# Patient Record
Sex: Female | Born: 1970
Health system: Southern US, Community
[De-identification: ages and names within clinical notes are randomized; demographics above are authoritative.]

## PROBLEM LIST (undated history)

## (undated) DIAGNOSIS — R1310 Dysphagia, unspecified: Secondary | ICD-10-CM

## (undated) DIAGNOSIS — R0602 Shortness of breath: Secondary | ICD-10-CM

## (undated) DIAGNOSIS — E042 Nontoxic multinodular goiter: Secondary | ICD-10-CM

## (undated) DIAGNOSIS — R7303 Prediabetes: Secondary | ICD-10-CM

## (undated) DIAGNOSIS — K5900 Constipation, unspecified: Secondary | ICD-10-CM

## (undated) DIAGNOSIS — E785 Hyperlipidemia, unspecified: Secondary | ICD-10-CM

## (undated) DIAGNOSIS — M7989 Other specified soft tissue disorders: Secondary | ICD-10-CM

## (undated) DIAGNOSIS — E669 Obesity, unspecified: Secondary | ICD-10-CM

## (undated) DIAGNOSIS — D509 Iron deficiency anemia, unspecified: Secondary | ICD-10-CM

## (undated) DIAGNOSIS — I10 Essential (primary) hypertension: Secondary | ICD-10-CM

## (undated) HISTORY — DX: Essential (primary) hypertension: I10

## (undated) HISTORY — DX: Other specified soft tissue disorders: M79.89

## (undated) HISTORY — PX: TUBAL LIGATION: SHX77

## (undated) HISTORY — DX: Constipation, unspecified: K59.00

## (undated) HISTORY — DX: Shortness of breath: R06.02

## (undated) HISTORY — DX: Hyperlipidemia, unspecified: E78.5

## (undated) HISTORY — DX: Dysphagia, unspecified: R13.10

## (undated) HISTORY — DX: Iron deficiency anemia, unspecified: D50.9

## (undated) HISTORY — DX: Nontoxic multinodular goiter: E04.2

## (undated) HISTORY — DX: Prediabetes: R73.03

## (undated) HISTORY — DX: Obesity, unspecified: E66.9

---
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
## 1998-06-28 ENCOUNTER — Other Ambulatory Visit: Admission: RE | Admit: 1998-06-28 | Discharge: 1998-06-28 | Payer: Self-pay | Admitting: Obstetrics and Gynecology

## 1998-07-30 ENCOUNTER — Ambulatory Visit (HOSPITAL_COMMUNITY): Admission: RE | Admit: 1998-07-30 | Discharge: 1998-07-30 | Payer: Self-pay | Admitting: Obstetrics and Gynecology

## 1999-07-17 ENCOUNTER — Emergency Department (HOSPITAL_COMMUNITY): Admission: EM | Admit: 1999-07-17 | Discharge: 1999-07-17 | Payer: Self-pay | Admitting: Emergency Medicine

## 1999-07-18 ENCOUNTER — Other Ambulatory Visit: Admission: RE | Admit: 1999-07-18 | Discharge: 1999-07-18 | Payer: Self-pay | Admitting: Obstetrics and Gynecology

## 2000-07-13 ENCOUNTER — Emergency Department (HOSPITAL_COMMUNITY): Admission: EM | Admit: 2000-07-13 | Discharge: 2000-07-13 | Payer: Self-pay | Admitting: Emergency Medicine

## 2000-09-11 ENCOUNTER — Other Ambulatory Visit: Admission: RE | Admit: 2000-09-11 | Discharge: 2000-09-11 | Payer: Self-pay | Admitting: Obstetrics and Gynecology

## 2001-05-04 ENCOUNTER — Inpatient Hospital Stay (HOSPITAL_COMMUNITY): Admission: AD | Admit: 2001-05-04 | Discharge: 2001-05-04 | Payer: Self-pay | Admitting: Obstetrics & Gynecology

## 2001-06-21 ENCOUNTER — Inpatient Hospital Stay (HOSPITAL_COMMUNITY): Admission: AD | Admit: 2001-06-21 | Discharge: 2001-06-21 | Payer: Self-pay | Admitting: Obstetrics and Gynecology

## 2001-06-26 ENCOUNTER — Inpatient Hospital Stay (HOSPITAL_COMMUNITY): Admission: AD | Admit: 2001-06-26 | Discharge: 2001-06-29 | Payer: Self-pay | Admitting: Obstetrics and Gynecology

## 2001-06-26 ENCOUNTER — Encounter: Payer: Self-pay | Admitting: Obstetrics and Gynecology

## 2001-09-16 ENCOUNTER — Other Ambulatory Visit: Admission: RE | Admit: 2001-09-16 | Discharge: 2001-09-16 | Payer: Self-pay | Admitting: Obstetrics and Gynecology

## 2002-12-31 ENCOUNTER — Inpatient Hospital Stay (HOSPITAL_COMMUNITY): Admission: AD | Admit: 2002-12-31 | Discharge: 2003-01-03 | Payer: Self-pay | Admitting: Obstetrics and Gynecology

## 2003-01-04 ENCOUNTER — Encounter: Admission: RE | Admit: 2003-01-04 | Discharge: 2003-02-03 | Payer: Self-pay | Admitting: Obstetrics and Gynecology

## 2003-01-27 ENCOUNTER — Other Ambulatory Visit: Admission: RE | Admit: 2003-01-27 | Discharge: 2003-01-27 | Payer: Self-pay | Admitting: Obstetrics & Gynecology

## 2003-02-04 ENCOUNTER — Encounter: Admission: RE | Admit: 2003-02-04 | Discharge: 2003-03-06 | Payer: Self-pay | Admitting: Obstetrics and Gynecology

## 2003-02-17 ENCOUNTER — Ambulatory Visit (HOSPITAL_COMMUNITY): Admission: RE | Admit: 2003-02-17 | Discharge: 2003-02-17 | Payer: Self-pay | Admitting: Obstetrics and Gynecology

## 2003-04-06 ENCOUNTER — Encounter: Admission: RE | Admit: 2003-04-06 | Discharge: 2003-05-06 | Payer: Self-pay | Admitting: Obstetrics and Gynecology

## 2003-06-06 ENCOUNTER — Encounter: Admission: RE | Admit: 2003-06-06 | Discharge: 2003-07-06 | Payer: Self-pay | Admitting: Obstetrics and Gynecology

## 2004-01-02 ENCOUNTER — Emergency Department (HOSPITAL_COMMUNITY): Admission: AD | Admit: 2004-01-02 | Discharge: 2004-01-02 | Payer: Self-pay | Admitting: Family Medicine

## 2004-01-20 ENCOUNTER — Encounter: Admission: RE | Admit: 2004-01-20 | Discharge: 2004-01-20 | Payer: Self-pay | Admitting: Internal Medicine

## 2004-07-29 ENCOUNTER — Emergency Department (HOSPITAL_COMMUNITY): Admission: EM | Admit: 2004-07-29 | Discharge: 2004-07-29 | Payer: Self-pay | Admitting: Emergency Medicine

## 2004-11-18 ENCOUNTER — Other Ambulatory Visit: Admission: RE | Admit: 2004-11-18 | Discharge: 2004-11-18 | Payer: Self-pay | Admitting: Obstetrics and Gynecology

## 2004-11-21 ENCOUNTER — Other Ambulatory Visit: Admission: RE | Admit: 2004-11-21 | Discharge: 2004-11-21 | Payer: Self-pay | Admitting: Obstetrics and Gynecology

## 2005-07-10 ENCOUNTER — Other Ambulatory Visit: Admission: RE | Admit: 2005-07-10 | Discharge: 2005-07-10 | Payer: Self-pay | Admitting: Obstetrics and Gynecology

## 2005-07-11 ENCOUNTER — Other Ambulatory Visit: Admission: RE | Admit: 2005-07-11 | Discharge: 2005-07-11 | Payer: Self-pay | Admitting: Obstetrics and Gynecology

## 2005-10-30 ENCOUNTER — Emergency Department (HOSPITAL_COMMUNITY): Admission: EM | Admit: 2005-10-30 | Discharge: 2005-10-30 | Payer: Self-pay | Admitting: Family Medicine

## 2007-09-09 LAB — CONVERTED CEMR LAB
Cholesterol: 291 mg/dL
HDL: 43 mg/dL
LDL Cholesterol: 232 mg/dL

## 2008-03-25 ENCOUNTER — Ambulatory Visit: Payer: Self-pay | Admitting: Internal Medicine

## 2008-03-25 DIAGNOSIS — E785 Hyperlipidemia, unspecified: Secondary | ICD-10-CM

## 2008-03-25 DIAGNOSIS — E669 Obesity, unspecified: Secondary | ICD-10-CM

## 2008-03-25 DIAGNOSIS — I10 Essential (primary) hypertension: Secondary | ICD-10-CM | POA: Insufficient documentation

## 2008-03-25 HISTORY — DX: Essential (primary) hypertension: I10

## 2008-03-25 HISTORY — DX: Obesity, unspecified: E66.9

## 2008-03-25 HISTORY — DX: Hyperlipidemia, unspecified: E78.5

## 2008-03-27 LAB — CONVERTED CEMR LAB
ALT: 14 units/L (ref 0–35)
BUN: 14 mg/dL (ref 6–23)
Creatinine, Ser: 0.6 mg/dL (ref 0.4–1.2)
GFR calc Af Amer: 146 mL/min
GFR calc non Af Amer: 121 mL/min
Glucose, Bld: 78 mg/dL (ref 70–99)
Potassium: 3.7 meq/L (ref 3.5–5.1)
Total Bilirubin: 0.3 mg/dL (ref 0.3–1.2)
Total Protein: 7.5 g/dL (ref 6.0–8.3)

## 2008-05-08 ENCOUNTER — Emergency Department (HOSPITAL_COMMUNITY): Admission: EM | Admit: 2008-05-08 | Discharge: 2008-05-08 | Payer: Self-pay | Admitting: Family Medicine

## 2008-05-18 ENCOUNTER — Ambulatory Visit: Payer: Self-pay | Admitting: Internal Medicine

## 2008-05-18 LAB — CONVERTED CEMR LAB
Alkaline Phosphatase: 64 units/L (ref 39–117)
Cholesterol: 165 mg/dL (ref 0–200)
Total Protein: 7.5 g/dL (ref 6.0–8.3)
Triglycerides: 90 mg/dL (ref 0–149)

## 2008-05-26 ENCOUNTER — Ambulatory Visit: Payer: Self-pay | Admitting: Internal Medicine

## 2008-11-13 ENCOUNTER — Ambulatory Visit: Payer: Self-pay | Admitting: Internal Medicine

## 2008-11-13 LAB — CONVERTED CEMR LAB
ALT: 10 units/L (ref 0–35)
AST: 17 units/L (ref 0–37)
Alkaline Phosphatase: 62 units/L (ref 39–117)
BUN: 12 mg/dL (ref 6–23)
CO2: 25 meq/L (ref 19–32)
Creatinine, Ser: 0.4 mg/dL (ref 0.4–1.2)
GFR calc Af Amer: 232 mL/min
GFR calc non Af Amer: 192 mL/min
HDL: 39.1 mg/dL (ref >39.0)
LDL Cholesterol: 97 mg/dL (ref 0–99)
Total Protein: 7.7 g/dL (ref 6.0–8.3)
VLDL: 13 mg/dL (ref 0–40)

## 2008-11-20 ENCOUNTER — Ambulatory Visit: Payer: Self-pay | Admitting: Internal Medicine

## 2008-12-02 ENCOUNTER — Encounter (INDEPENDENT_AMBULATORY_CARE_PROVIDER_SITE_OTHER): Payer: Self-pay | Admitting: Obstetrics and Gynecology

## 2008-12-02 ENCOUNTER — Ambulatory Visit (HOSPITAL_COMMUNITY): Admission: RE | Admit: 2008-12-02 | Discharge: 2008-12-02 | Payer: Self-pay | Admitting: Obstetrics and Gynecology

## 2009-05-14 ENCOUNTER — Ambulatory Visit: Payer: Self-pay | Admitting: Internal Medicine

## 2009-05-14 LAB — CONVERTED CEMR LAB
Albumin: 3.3 g/dL — ABNORMAL LOW (ref 3.5–5.2)
Alkaline Phosphatase: 83 units/L (ref 39–117)
Basophils Absolute: 0.1 10*3/uL (ref 0.0–0.1)
Bilirubin, Direct: 0 mg/dL (ref 0.0–0.3)
Calcium: 8.9 mg/dL (ref 8.4–10.5)
Direct LDL: 156.1 mg/dL
Eosinophils Absolute: 0.1 10*3/uL (ref 0.0–0.7)
HCT: 28.7 % — ABNORMAL LOW (ref 36.0–46.0)
HDL: 44.8 mg/dL (ref >39.00)
MCV: 61.5 fL — ABNORMAL LOW (ref 78.0–100.0)
Nitrite: NEGATIVE
Specific Gravity, Urine: >=1.03
TSH: 0.67 microintl units/mL (ref 0.35–5.50)
Total CHOL/HDL Ratio: 5
Urobilinogen, UA: 1
WBC: 8.5 10*3/uL (ref 4.5–10.5)
pH: 5.5

## 2009-05-29 ENCOUNTER — Emergency Department (HOSPITAL_COMMUNITY): Admission: EM | Admit: 2009-05-29 | Discharge: 2009-05-29 | Payer: Self-pay | Admitting: Emergency Medicine

## 2009-06-11 ENCOUNTER — Ambulatory Visit: Payer: Self-pay | Admitting: Internal Medicine

## 2009-06-11 DIAGNOSIS — D509 Iron deficiency anemia, unspecified: Secondary | ICD-10-CM

## 2009-06-11 HISTORY — DX: Iron deficiency anemia, unspecified: D50.9

## 2009-06-15 ENCOUNTER — Encounter: Admission: RE | Admit: 2009-06-15 | Discharge: 2009-06-15 | Payer: Self-pay | Admitting: Internal Medicine

## 2009-07-15 ENCOUNTER — Ambulatory Visit: Payer: Self-pay | Admitting: Endocrinology

## 2009-07-15 DIAGNOSIS — E042 Nontoxic multinodular goiter: Secondary | ICD-10-CM | POA: Insufficient documentation

## 2009-07-15 HISTORY — DX: Nontoxic multinodular goiter: E04.2

## 2009-10-04 ENCOUNTER — Ambulatory Visit: Payer: Self-pay | Admitting: Internal Medicine

## 2009-10-04 LAB — CONVERTED CEMR LAB
Alkaline Phosphatase: 82 units/L (ref 39–117)
Bilirubin, Direct: 0.1 mg/dL (ref 0.0–0.3)
Cholesterol: 224 mg/dL — ABNORMAL HIGH (ref 0–200)
Total Bilirubin: 0.5 mg/dL (ref 0.3–1.2)
Total CHOL/HDL Ratio: 5
VLDL: 11.8 mg/dL (ref 0.0–40.0)

## 2009-10-08 ENCOUNTER — Ambulatory Visit: Payer: Self-pay | Admitting: Internal Medicine

## 2009-10-30 HISTORY — PX: LAPAROSCOPIC GASTRIC BANDING: SHX1100

## 2009-11-30 HISTORY — PX: LEEP: SHX91

## 2009-12-25 ENCOUNTER — Ambulatory Visit: Payer: Self-pay | Admitting: Family Medicine

## 2009-12-30 ENCOUNTER — Ambulatory Visit: Payer: Self-pay | Admitting: Internal Medicine

## 2009-12-30 ENCOUNTER — Telehealth: Payer: Self-pay | Admitting: Internal Medicine

## 2009-12-30 LAB — CONVERTED CEMR LAB
ALT: 15 units/L (ref 0–35)
Alkaline Phosphatase: 95 units/L (ref 39–117)
Basophils Absolute: 0 10*3/uL (ref 0.0–0.1)
CO2: 31 meq/L (ref 19–32)
Calcium: 9.3 mg/dL (ref 8.4–10.5)
Eosinophils Relative: 0.6 % (ref 0.0–5.0)
GFR calc non Af Amer: 144.49 mL/min (ref >60)
Glucose, Bld: 84 mg/dL (ref 70–99)
HDL: 45.5 mg/dL (ref >39.00)
Hemoglobin: 13.3 g/dL (ref 12.0–15.0)
MCV: 81.2 fL (ref 78.0–100.0)
Monocytes Absolute: 0.7 10*3/uL (ref 0.1–1.0)
Potassium: 2.8 meq/L — CL (ref 3.5–5.1)
Total CHOL/HDL Ratio: 4

## 2010-01-03 ENCOUNTER — Ambulatory Visit: Payer: Self-pay | Admitting: Internal Medicine

## 2010-01-04 LAB — CONVERTED CEMR LAB
BUN: 10 mg/dL (ref 6–23)
Calcium: 9.1 mg/dL (ref 8.4–10.5)
Chloride: 100 meq/L (ref 96–112)
GFR calc non Af Amer: 120.94 mL/min (ref >60)
Glucose, Bld: 96 mg/dL (ref 70–99)
Potassium: 3.1 meq/L — ABNORMAL LOW (ref 3.5–5.1)

## 2010-01-06 ENCOUNTER — Ambulatory Visit: Payer: Self-pay | Admitting: Internal Medicine

## 2010-05-27 ENCOUNTER — Ambulatory Visit: Payer: Self-pay | Admitting: Internal Medicine

## 2010-06-28 ENCOUNTER — Ambulatory Visit (HOSPITAL_COMMUNITY): Admission: RE | Admit: 2010-06-28 | Discharge: 2010-06-28 | Payer: Self-pay | Admitting: General Surgery

## 2010-07-18 ENCOUNTER — Encounter
Admission: RE | Admit: 2010-07-18 | Discharge: 2010-10-16 | Payer: Self-pay | Source: Home / Self Care | Attending: General Surgery | Admitting: General Surgery

## 2010-08-23 ENCOUNTER — Encounter: Payer: Self-pay | Admitting: Internal Medicine

## 2010-08-24 ENCOUNTER — Ambulatory Visit: Payer: Self-pay | Admitting: Internal Medicine

## 2010-10-04 ENCOUNTER — Ambulatory Visit (HOSPITAL_COMMUNITY)
Admission: RE | Admit: 2010-10-04 | Discharge: 2010-10-04 | Payer: Self-pay | Source: Home / Self Care | Admitting: General Surgery

## 2010-10-14 ENCOUNTER — Encounter: Payer: Self-pay | Admitting: Internal Medicine

## 2010-10-18 ENCOUNTER — Encounter
Admission: RE | Admit: 2010-10-18 | Discharge: 2010-11-29 | Payer: Self-pay | Source: Home / Self Care | Attending: General Surgery | Admitting: General Surgery

## 2010-11-25 ENCOUNTER — Other Ambulatory Visit: Payer: Self-pay | Admitting: Internal Medicine

## 2010-11-25 ENCOUNTER — Ambulatory Visit
Admission: RE | Admit: 2010-11-25 | Discharge: 2010-11-25 | Payer: Self-pay | Source: Home / Self Care | Attending: Internal Medicine | Admitting: Internal Medicine

## 2010-11-25 LAB — BASIC METABOLIC PANEL
CO2: 26 mEq/L (ref 19–32)
Creatinine, Ser: 0.6 mg/dL (ref 0.4–1.2)
GFR: 146.61 mL/min (ref >60.00)
Glucose, Bld: 77 mg/dL (ref 70–99)
Potassium: 4.4 mEq/L (ref 3.5–5.1)

## 2010-11-25 LAB — TSH: TSH: 0.61 u[IU]/mL (ref 0.35–5.50)

## 2010-11-25 LAB — CBC WITH DIFFERENTIAL/PLATELET
Eosinophils Relative: 1.2 % (ref 0.0–5.0)
Hemoglobin: 13.2 g/dL (ref 12.0–15.0)
Lymphs Abs: 2 10*3/uL (ref 0.7–4.0)
MCHC: 34 g/dL (ref 30.0–36.0)
MCV: 82.7 fl (ref 78.0–100.0)
Monocytes Absolute: 0.4 10*3/uL (ref 0.1–1.0)
Monocytes Relative: 5.3 % (ref 3.0–12.0)
Neutrophils Relative %: 67.6 % (ref 43.0–77.0)
Platelets: 320 10*3/uL (ref 150.0–400.0)
RDW: 15 % — ABNORMAL HIGH (ref 11.5–14.6)
WBC: 7.7 10*3/uL (ref 4.5–10.5)

## 2010-11-25 LAB — HEPATIC FUNCTION PANEL
ALT: 31 U/L (ref 0–35)
Alkaline Phosphatase: 87 U/L (ref 39–117)
Bilirubin, Direct: 0.1 mg/dL (ref 0.0–0.3)
Total Bilirubin: 0.6 mg/dL (ref 0.3–1.2)

## 2010-11-25 LAB — LIPID PANEL: Triglycerides: 61 mg/dL (ref 0.0–149.0)

## 2010-11-25 LAB — LDL CHOLESTEROL, DIRECT: Direct LDL: 168.6 mg/dL

## 2010-11-28 ENCOUNTER — Telehealth: Payer: Self-pay | Admitting: Internal Medicine

## 2010-11-29 NOTE — Assessment & Plan Note (Signed)
Summary: cough-greenish yellow x 2 dys, sorethroat, achy rm 3   Vital Signs:  Patient Profile:   40 Years Old Female CC:      Cold & URI symptoms Height:     63 inches Weight:      300 pounds O2 Sat:      100 % O2 treatment:    Room Air Temp:     98.4 degrees F oral Pulse rate:   80 / minute Pulse rhythm:   300regular Resp:     17 per minute (right arm) Cuff size:   regular  Vitals Entered By: Areta Haber CMA (December 25, 2009 9:29 AM)                  Current Allergies: No known allergies History of Present Illness History from: patient Chief Complaint: Cold & URI symptoms History of Present Illness: cough  and aching for 2 days, yellow green phlegm  Current Problems: UPPER RESPIRATORY INFECTION, ACUTE (ICD-465.9) ? of COUGH (ICD-786.2) GOITER, MULTINODULAR (ICD-241.1) PREVENTIVE HEALTH CARE (ICD-V70.0) ANEMIA, IRON DEFICIENCY UNSPEC (ICD-280.9) OBESITY (ICD-278.00) HYPERTENSION (ICD-401.9) HYPERLIPIDEMIA (ICD-272.4)   Current Meds SIMVASTATIN 40 MG TABS (SIMVASTATIN) Take one tablet at bedtime IBUPROFEN 800 MG  TABS (IBUPROFEN) as needed * FLEXERIL as needed * VICODIN as needed FERROUS SULFATE 325 (65 FE) MG TABS (FERROUS SULFATE) two times a day CHLORTHALIDONE 25 MG  TABS (CHLORTHALIDONE) Take 1 tab by mouth every day STOOL SOFTENER 100 MG CAPS (DOCUSATE SODIUM) 1 tab by mouth two times a day PHENTERMINE HCL 30 MG CAPS (PHENTERMINE HCL) 1 tab by mouth once daily * Z-PACK use as directed * CHERATUSSIN A/C  6OZ 1-2 tsp q6h as needed cough  REVIEW OF SYSTEMS Constitutional Symptoms       Complains of fever and fatigue.     Denies chills, night sweats, weight loss, and weight gain.      Comments: achy Eyes       Denies change in vision, eye pain, eye discharge, glasses, contact lenses, and eye surgery. Ear/Nose/Throat/Mouth       Complains of sore throat and hoarseness.      Denies hearing loss/aids, change in hearing, ear pain, ear discharge,  dizziness, frequent runny nose, frequent nose bleeds, sinus problems, and tooth pain or bleeding.      Comments: x 2 dys Respiratory       Complains of productive cough and shortness of breath.      Denies dry cough, wheezing, asthma, bronchitis, and emphysema/COPD.  Cardiovascular       Denies murmurs, chest pain, and tires easily with exhertion.    Gastrointestinal       Denies stomach pain, nausea/vomiting, diarrhea, constipation, blood in bowel movements, and indigestion. Genitourniary       Denies painful urination, kidney stones, and loss of urinary control. Neurological       Denies paralysis, seizures, and fainting/blackouts. Musculoskeletal       Denies muscle pain, joint pain, joint stiffness, decreased range of motion, redness, swelling, muscle weakness, and gout.  Skin       Denies bruising, unusual mles/lumps or sores, and hair/skin or nail changes.  Psych       Denies mood changes, temper/anger issues, anxiety/stress, speech problems, depression, and sleep problems.  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 Alcohol use-yes Regular exercise-no Single 5 kids---healthy Divorced  Risk Factors: Exercise: no (03/25/2008)  Risk Factors: Smoking Status: quit > 6 months (10/08/2009) Physical Exam General appearance: well developed, well nourished, no acute distress Head: normocephalic, atraumatic Eyes: conjunctivae and lids normal Pupils: equal, round, reactive to light Ears: normal, no lesions  or deformities Nasal: mucosa pink, nonedematous, no septal deviation, turbinates normal Oral/Pharynx: tongue normal, posterior pharynx without erythema or exudate Neck: neck supple,  trachea midline, no masses Thyroid: no nodules, masses, tenderness, or enlargement Chest/Lungs: scattered rhonchi Heart: regular rate and  rhythm, no murmur Assessment New Problems: UPPER RESPIRATORY INFECTION, ACUTE (ICD-465.9) ? of COUGH (ICD-786.2)   Patient Education: Patient and/or caregiver instructed in the following: rest fluids and Tylenol. take all of z-pack  Plan New Medications/Changes: CHERATUSSIN A/C  6OZ 1-2 tsp q6h as needed cough  #6oz x 1, 12/25/2009, Quita Skye Elvyn Krohn MD Z-PACK use as directed  #1 x 0, 12/25/2009, Linna Hoff MD  New Orders: T-Chest x-ray, 2 views [71020] Est. Patient Level III [[address]] Follow Up: Follow up with Primary Physician  The patient and/or caregiver has been counseled thoroughly with regard to medications prescribed including dosage, schedule, interactions, rationale for use, and possible side effects and they verbalize understanding.  Diagnoses and expected course of recovery discussed and will return if not improved as expected or if the condition worsens. Patient and/or caregiver verbalized understanding.  Prescriptions: CHERATUSSIN A/C  6OZ 1-2 tsp q6h as needed cough  #6oz x 1   Entered and Authorized by:   Linna Hoff MD   Signed by:   Linna Hoff MD on 12/25/2009   Method used:   Print then Give to Patient   RxID:   [id number] Z-PACK use as directed  #1 x 0   Entered and Authorized by:   Linna Hoff MD   Signed by:   Linna Hoff MD on 12/25/2009   Method used:   Print then Give to Patient   RxID:   [id number]   Patient Instructions: 1)  taker all of medicine as indicated 2)  Drink as much fluid as you can tolerate for the next few days. 3)  Please schedule a follow-up appointment as needed.

## 2010-11-29 NOTE — Letter (Signed)
Summary: Health Screening Report  Health Screening Report   Imported By: Maryln Gottron 08/29/2010 10:45:49  _____________________________________________________________________  External Attachment:    Type:   Image     Comment:   External Document

## 2010-11-29 NOTE — Assessment & Plan Note (Signed)
Summary: 3 week fup//ccm   Vital Signs:  Patient profile:   40 year old female Weight:      291 pounds Temp:     98.1 degrees F oral Pulse rate:   96 / minute BP sitting:   124 / 90  (left arm) Cuff size:   large  Vitals Entered By: Alfred Levins, CMA (August 24, 2010 8:37 AM) CC: f/u on bp and cholesterol, not fasting   Primary Care Provider:  Dr Birdie Sons  CC:  f/u on bp and cholesterol and not fasting.  History of Present Illness:  Follow-Up Visit      This is a 40 year old woman who presents for Follow-up visit.  The patient denies chest pain and palpitations.  Since the last visit the patient notes no new problems or concerns.  The patient reports taking meds as prescribed, dietary noncompliance, and not exercising.  When questioned about possible medication side effects, the patient notes none.    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   Current Problems (verified): 1)  Goiter, Multinodular  (ICD-241.1) 2)  Preventive Health Care  (ICD-V70.0) 3)  Anemia, Iron Deficiency Unspec  (ICD-280.9) 4)  Obesity  (ICD-278.00) 5)  Hypertension  (ICD-401.9) 6)  Hyperlipidemia  (ICD-272.4)  Current Medications (verified): 1)  Simvastatin 20 Mg Tabs (Simvastatin) .Marland Kitchen.. 1 By Mouth At Bedtime 2)  Ferrous Sulfate 325 (65 Fe) Mg Tabs (Ferrous Sulfate) .... Two Times A Day 3)  Stool Softener 100 Mg Caps (Docusate Sodium) .Marland Kitchen.. 1 Tab By Mouth Two Times A Day 4)  Lisinopril 40 Mg Tabs (Lisinopril) .Marland Kitchen.. 1 By Mouth Once Daily  Allergies (verified): 1)  ! 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  obese Head:  normocephalic and atraumatic.   Eyes:  pupils equal and pupils round.   Ears:  R ear normal and L ear normal.   Neck:  thyroid has an irregular surface, but i do not appreciate a nodule--enlarged Lungs:  normal respiratory effort and no intercostal retractions.   Heart:  normal rate and regular rhythm.   Abdomen:  Bowel sounds positive,abdomen soft and non-tender without masses, organomegaly or hernias noted. morbidly obese Msk:  No deformity or scoliosis noted of thoracic or lumbar spine.   Neurologic:  cranial nerves II-XII intact and gait normal.     Impression & Recommendations:  Problem # 1:  OBESITY (ICD-278.00)  desperately needs to lose weight she is interested in surgical weight loss options (process has been slow) I would support anything that would result in successful weight loss I suspect her risk of death from chronic obesity is much higher than complications related to surgical weight loss.   letter written supporting surgical options related to weight loss  Problem # 2:  HYPERTENSION (ICD-401.9) note previous hypokalemia---recheck bp ok given weight Her updated medication list for this problem includes:  Lisinopril 40 Mg Tabs (Lisinopril) .Marland Kitchen... 1 by mouth once daily  BP today: 124/90 Prior BP: 132/98 (05/27/2010)  Labs Reviewed: K+: 3.1 (01/03/2010) Creat: : 0.7 (01/03/2010)   Chol: 183 (12/30/2009)   HDL: 45.50 (12/30/2009)   LDL: 126 (12/30/2009)   TG: 58.0 (12/30/2009)  Problem # 3:  HYPERLIPIDEMIA (ICD-272.4) check labs  today Her updated medication list for this problem includes:    Simvastatin 20 Mg Tabs (Simvastatin) .Marland Kitchen... 1 by mouth at bedtime  Labs Reviewed: SGOT: 16 (12/30/2009)   SGPT: 15 (12/30/2009)   HDL:45.50 (12/30/2009), 47.30 (10/04/2009)  LDL:126 (12/30/2009), 97 (16/07/9603)  Chol:183 (12/30/2009), 224 (10/04/2009)  Trig:58.0 (12/30/2009), 59.0 (10/04/2009) she had wellness labs performed yesterday we looked for results---not yet back she will print a copy for me and fax them to me  Complete Medication List: 1)  Simvastatin 20 Mg Tabs (Simvastatin) .Marland Kitchen.. 1 by mouth at bedtime 2)  Ferrous Sulfate 325 (65 Fe) Mg Tabs (Ferrous sulfate) .... Two times a day 3)  Stool Softener 100 Mg Caps (Docusate sodium) .Marland Kitchen.. 1 tab by mouth two times a day 4)  Lisinopril 40 Mg Tabs (Lisinopril) .Marland Kitchen.. 1 by mouth once daily  Patient Instructions: 1)  Please schedule a follow-up appointment in 6 months.   Orders Added: 1)  Est. Patient Level IV [54098]

## 2010-11-29 NOTE — Letter (Signed)
Summary: Generic Letter-weight loss  [place] [facility]  [address] [place], [place] [address]   Phone: [phone]  Fax: [phone]    05/27/2010  [facility] [address] [place], [place]  [address]  To whom it may concern,  Enijah Furr is a long term patient. she suffers from morbid obesity and has the following comorbididties: hypertension, hyperlipidemia. She has previously been involved with a bariatric weight loss clinic and has tried losing weight on her own.   Her risk of prolonged morbid obesity far outweigh the risk of surgical weight loss and its associated complications.   Birdie Sons MD  May 27, 2010 11:35 AM  enc: flow sheet with labs and weights        Sincerely,   Birdie Sons MD

## 2010-11-29 NOTE — Assessment & Plan Note (Signed)
Summary: discuss BP/et--recheck potassium/et   Vital Signs:  Patient profile:   40 year old female Weight:      283 pounds Temp:     98.2 degrees F oral Pulse rate:   100 / minute Pulse rhythm:   regular Resp:     14 per minute BP sitting:   130 / 78  (right arm) Cuff size:   large  Vitals Entered By: Gladis Riffle, RN (January 06, 2010 1:16 PM) CC: discuss BP, recheck potassium--BP 130-98 this AM in forearm Is Patient Diabetic? No   Primary Care Provider:  Dr Birdie Sons  CC:  discuss BP and recheck potassium--BP 130-98 this AM in forearm.  History of Present Illness:  Follow-Up Visit      This is a 40 year old woman who presents for Follow-up visit.  The patient denies chest pain and palpitations.  Since the last visit the patient notes no new problems or concerns.  The patient reports taking meds as prescribed---on chlorthalidone---hypokalemia.  All other systems reviewed and were negative   Preventive Screening-Counseling & Management  Alcohol-Tobacco     Smoking Status: quit > 6 months     Year Started: 1988     Year Quit: 1990  Current Problems (verified): 1)  Goiter, Multinodular  (ICD-241.1) 2)  Preventive Health Care  (ICD-V70.0) 3)  Anemia, Iron Deficiency Unspec  (ICD-280.9) 4)  Obesity  (ICD-278.00) 5)  Hypertension  (ICD-401.9) 6)  Hyperlipidemia  (ICD-272.4)  Current Medications (verified): 1)  Simvastatin 40 Mg Tabs (Simvastatin) .... Take One Tablet At Bedtime 2)  Ferrous Sulfate 325 (65 Fe) Mg Tabs (Ferrous Sulfate) .... Two Times A Day 3)  Chlorthalidone 25 Mg  Tabs (Chlorthalidone) .... Take 1 Tab By Mouth Every Day 4)  Stool Softener 100 Mg Caps (Docusate Sodium) .Marland Kitchen.. 1 Tab By Mouth Two Times A Day 5)  Phentermine Hcl 30 Mg Caps (Phentermine Hcl) .Marland Kitchen.. 1 Tab By Mouth Once Daily 6)  Cheratussin A/c  6oz .Marland Kitchen.. 1-2 Tsp Q6h As Needed Cough 7)  Klor-Con M20 20 Meq Cr-Tabs (Potassium Chloride Crys Cr) .... Two Times A Day or As Directed  Allergies  (verified): 1)  ! 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   Head:  normocephalic and atraumatic.   Neck:  thyroid has an irregular surface, but i do not appreciate a nodule--enlarged Chest Wall:  No deformities, masses, or tenderness noted. Lungs:  Clear to auscultation bilaterally. Normal respiratory effort.  Heart:  Regular rate and rhythm without murmurs or gallops noted. Normal S1,S2.     Impression & Recommendations:  Problem # 1:  HYPERTENSION (ICD-401.9) discussed medications.  Try lisinopril.  He to long-term success his aggressive weight loss.  Side effects of lisinopril discussed.  She will followup with me in 3 month. The following medications were removed from the medication list:    Chlorthalidone 25 Mg Tabs (Chlorthalidone) .Marland Kitchen... Take 1  tab by mouth every day Her updated medication list for this problem includes:    Lisinopril 20 Mg Tabs (Lisinopril) .Marland Kitchen... 1 tablet by mouth daily  BP today: 130/78 Prior BP: 152/88 (10/08/2009)  Labs Reviewed: K+: 3.1 (01/03/2010) Creat: : 0.7 (01/03/2010)   Chol: 183 (12/30/2009)   HDL: 45.50 (12/30/2009)   LDL: 126 (12/30/2009)   TG: 58.0 (12/30/2009)  Complete Medication List: 1)  Simvastatin  40 Mg Tabs (Simvastatin) .... Take one tablet at bedtime 2)  Ferrous Sulfate 325 (65 Fe) Mg Tabs (Ferrous sulfate) .... Two times a day 3)  Stool Softener 100 Mg Caps (Docusate sodium) .Marland Kitchen.. 1 tab by mouth two times a day 4)  Phentermine Hcl 30 Mg Caps (Phentermine hcl) .Marland Kitchen.. 1 tab by mouth once daily 5)  Lisinopril 20 Mg Tabs (Lisinopril) .Marland Kitchen.. 1 tablet by mouth daily  Patient Instructions: 1)  Please schedule a follow-up appointment in 3 months. Prescriptions: LISINOPRIL 20 MG TABS (LISINOPRIL) 1 tablet by mouth daily  #90 x 3   Entered and Authorized by:   Birdie Sons MD   Signed by:   Birdie Sons MD on 01/06/2010   Method used:   Electronically to        [facility]* (retail)       [address]       [address] Shipping/mailing       [place], [place]  [address]       Ph: [phone]       Fax: [phone]   RxID:   [id number]

## 2010-11-29 NOTE — Progress Notes (Signed)
Summary: critical lab    [facility] 12/30/2009  Phone Note From Other Clinic   Caller: laurie, [facility] Call For: swords Summary of Call: critical potassium of 2.8 Initial call taken by: Gladis Riffle, RN,  December 30, 2009 1:52 PM  Follow-up for Phone Call        Left message for Dr. Cato Mulligan regarding low potassium.  2.8 Follow-up by: Lynann Beaver CMA,  December 30, 2009 3:13 PM  Additional Follow-up for Phone Call Additional follow up Details #1::        call patient. start Kdur 20 meq by mouth two times a day . Check Bmet on monday---995.2 Additional Follow-up by: Birdie Sons MD,  December 30, 2009 3:32 PM    Additional Follow-up for Phone Call Additional follow up Details #2::    returning ellen's call---[phone] Follow-up by: Warnell Forester,  December 31, 2009 8:51 AM  Additional Follow-up for Phone Call Additional follow up Details #3:: Details for Additional Follow-up Action Taken: Patient notified. Lab appt made 3/7 Additional Follow-up by: Gladis Riffle, RN,  December 31, 2009 9:29 AM  New/Updated Medications: POTASSIUM CHLORIDE 20 MEQ  PACK (POTASSIUM CHLORIDE) two times a day or as directed Prescriptions: POTASSIUM CHLORIDE 20 MEQ  PACK (POTASSIUM CHLORIDE) two times a day or as directed  #60 x 3   Entered by:   Gladis Riffle, RN   Authorized by:   Birdie Sons MD   Signed by:   Gladis Riffle, RN on 12/31/2009   Method used:   Electronically to        [facility]* (retail)       [address]       [address] Shipping/mailing       [place], [place]  [address]       Ph: [phone]       Fax: [phone]   RxID:   [id number] POTASSIUM CHLORIDE 20 MEQ  PACK (POTASSIUM CHLORIDE) two times a day or as directed  #60 x 3   Entered and Authorized by:   Birdie Sons MD   Signed by:   Birdie Sons MD on 12/30/2009   Method used:   Telephoned to ...         RxID:   [id number]  LMTCB re: low potassium

## 2010-11-29 NOTE — Assessment & Plan Note (Signed)
Summary: 3 month rov/njr/pt rescd///ccm---PT RSC  (BMP) // RS/PT [facility] F...   Vital Signs:  Patient profile:   40 year old female Height:      63 inches Weight:      288 pounds BMI:     51.20 Temp:     98.5 degrees F oral Pulse rate:   96 / minute Pulse rhythm:   regular BP sitting:   132 / 98  (left arm) Cuff size:   large  Vitals Entered By: Kern Reap CMA Duncan Dull) (May 27, 2010 11:03 AM)  Nutrition Counseling: Patient's BMI is greater than 25 and therefore counseled on weight management options. CC: follow-up visit Is Patient Diabetic? No Pain Assessment Patient in pain? no        Primary Care Provider:  Dr Birdie Sons  CC:  follow-up visit.  History of Present Illness:  Follow-Up Visit      This is a 40 year old woman who presents for Follow-up visit.  The patient denies chest pain and palpitations.  Since the last visit the patient notes no new problems or concerns except she reports episodes of palpitations and fluttering. states she was awakened once with what felt like a fast heart beat. She cannot report any other specific incidence.  The patient reports not taking meds as prescribed, not monitoring BP, dietary noncompliance, and not exercising.  When questioned about possible medication side effects, the patient notes none.    All other systems reviewed and were negative   Preventive Screening-Counseling & Management  Alcohol-Tobacco     Smoking Status: quit     Year Quit: 1991  Current Problems (verified): 1)  Goiter, Multinodular  (ICD-241.1) 2)  Preventive Health Care  (ICD-V70.0) 3)  Anemia, Iron Deficiency Unspec  (ICD-280.9) 4)  Obesity  (ICD-278.00) 5)  Hypertension  (ICD-401.9) 6)  Hyperlipidemia  (ICD-272.4)  Current Medications (verified): 1)  Simvastatin 40 Mg Tabs (Simvastatin) .... Take One Tablet At Bedtime 2)  Ferrous Sulfate 325 (65 Fe) Mg Tabs (Ferrous Sulfate) .... Two Times A Day 3)  Stool Softener 100 Mg Caps (Docusate Sodium) .Marland Kitchen..  1 Tab By Mouth Two Times A Day 4)  Lisinopril 20 Mg Tabs (Lisinopril) .Marland Kitchen.. 1 Tablet By Mouth Daily  Allergies: 1)  ! 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   Head:  normocephalic and atraumatic.   Eyes:  pupils equal and pupils round.   Neck:  thyroid has an irregular surface, but i do not appreciate a nodule--enlarged Chest Wall:  No deformities, masses, or tenderness noted. Lungs:  normal respiratory effort and no intercostal retractions.   Abdomen:  Bowel sounds positive,abdomen soft and non-tender without masses, organomegaly or hernias noted. morbidly obese Msk:  No deformity or scoliosis noted of thoracic or lumbar spine.   Neurologic:  cranial nerves II-XII intact and gait normal.     Impression & Recommendations:  Problem # 1:  HYPERTENSION (ICD-401.9) stressed need for compliance Her updated medication list for this problem includes:    Lisinopril 40 Mg Tabs (Lisinopril) .Marland Kitchen... 1 by mouth once daily  BP today: 132/98 Prior BP: 130/78 (01/06/2010)  Labs Reviewed: K+: 3.1 (01/03/2010) Creat: : 0.7 (01/03/2010)   Chol: 183 (12/30/2009)   HDL: 45.50 (12/30/2009)   LDL: 126 (12/30/2009)   TG: 58.0  (12/30/2009)  Problem # 2:  OBESITY (ICD-278.00) desperately needs to lose weight she is interested in surgical weight loss options I would support anything that would result in successful weight loss I suspect her risk of death from chronic obesity is much higher than complications related to surgical weight loss.   letter written supporting surgical options related to weight loss  Problem # 3:  ANEMIA, IRON DEFICIENCY UNSPEC (ICD-280.9) resolved ok to decrease iron Her updated medication list for this problem includes:    Ferrous Sulfate 325 (65 Fe) Mg Tabs (Ferrous sulfate) .Marland Kitchen..Marland Kitchen Two times a day  Hgb: 13.3 (12/30/2009)   Hct: 41.2 (12/30/2009)   Platelets: 417.0 (12/30/2009) RBC: 5.07 (12/30/2009)   RDW: 16.4 (12/30/2009)   WBC: 9.7 (12/30/2009) MCV: 81.2 (12/30/2009)   MCHC: 32.4 (12/30/2009) TSH: 0.67 (05/14/2009)  Problem # 4:  HYPERLIPIDEMIA (ICD-272.4) controlled continue current medications  Her updated medication list for this problem includes:    Simvastatin 40 Mg Tabs (Simvastatin) .Marland Kitchen... Take one tablet at bedtime  Labs Reviewed: SGOT: 16 (12/30/2009)   SGPT: 15 (12/30/2009)   HDL:45.50 (12/30/2009), 47.30 (10/04/2009)  LDL:126 (12/30/2009), 97 (16/07/9603)  Chol:183 (12/30/2009), 224 (10/04/2009)  Trig:58.0 (12/30/2009), 59.0 (10/04/2009)  Complete Medication List: 1)  Simvastatin 40 Mg Tabs (Simvastatin) .... Take one tablet at bedtime 2)  Ferrous Sulfate 325 (65 Fe) Mg Tabs (Ferrous sulfate) .... Two times a day 3)  Stool Softener 100 Mg Caps (Docusate sodium) .Marland Kitchen.. 1 tab by mouth two times a day 4)  Lisinopril 40 Mg Tabs (Lisinopril) .Marland Kitchen.. 1 by mouth once daily  Patient Instructions: 1)  see me 2-3 months Prescriptions: LISINOPRIL 40 MG TABS (LISINOPRIL) 1 by mouth once daily  #90 x 3   Entered and Authorized by:   Birdie Sons MD   Signed by:   Birdie Sons MD on 05/27/2010   Method used:   Electronically to        [facility]* (retail)        [address]       [address] Shipping/mailing       [place], [place]  [address]       Ph: [phone]       Fax: [phone]   RxID:   [id number]

## 2010-11-30 ENCOUNTER — Ambulatory Visit: Payer: 59 | Attending: General Surgery | Admitting: *Deleted

## 2010-11-30 ENCOUNTER — Encounter: Admit: 2010-11-30 | Payer: Self-pay | Admitting: General Surgery

## 2010-11-30 DIAGNOSIS — Z09 Encounter for follow-up examination after completed treatment for conditions other than malignant neoplasm: Secondary | ICD-10-CM | POA: Insufficient documentation

## 2010-11-30 DIAGNOSIS — Z713 Dietary counseling and surveillance: Secondary | ICD-10-CM | POA: Insufficient documentation

## 2010-11-30 DIAGNOSIS — Z9884 Bariatric surgery status: Secondary | ICD-10-CM | POA: Insufficient documentation

## 2010-12-01 NOTE — Letter (Signed)
Summary: [facility]   Imported By: Maryln Gottron 11/15/2010 15:53:56  _____________________________________________________________________  External Attachment:    Type:   Image     Comment:   External Document

## 2010-12-01 NOTE — Assessment & Plan Note (Signed)
Summary: 3 MONTH FOLLOW UP/CJR   Vital Signs:  Patient profile:   40 year old female Weight:      269 pounds Temp:     98.5 degrees F oral Pulse rate:   80 / minute Pulse rhythm:   regular BP sitting:   132 / 110  (left arm) Cuff size:   large  Vitals Entered By: Alfred Levins, CMA (November 25, 2010 8:39 AM) CC: f/u   Primary Care Provider:  Dr Birdie Sons  CC:  f/u.  History of Present Illness: s/p lap band---doing well. has lost 20 pounds  htn---no home bps and not taking meds  lipids --- tolerating meds when she takes them but she is not taking any meds  All other systems reviewed and were negative --- rare nausea    Current Problems (verified): 1)  Goiter, Multinodular  (ICD-241.1) 2)  Preventive Health Care  (ICD-V70.0) 3)  Anemia, Iron Deficiency Unspec  (ICD-280.9) 4)  Obesity  (ICD-278.00) 5)  Hypertension  (ICD-401.9) 6)  Hyperlipidemia  (ICD-272.4)  Current Medications (verified): 1)  Miralax   Powd (Polyethylene Glycol 3350) .Marland Kitchen.. 17g By Mouth Once Daily As Needed Constipation  Allergies (verified): 1)  ! Chlorthalidone (Chlorthalidone)  Past History:  Past Surgical History: Caesarean section x2  7/91, 5/95 Cholecystectomy (laparascopy)  10/91 Tubal ligation )laparoscopy) 4/04 lap band---dr hoxworth- 10/05/11  Family History: Reviewed history from 07/15/2009 and no changes required. father-alive and well mother with TIA, DM Family History Diabetes 1st degree relative-mother no thyroid probs ot goiter  Social History: Reviewed history from 03/25/2008 and no changes required. 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   Eyes:  pupils equal and pupils round.   Ears:  R ear normal and L ear normal.   Neck:  thyroid has an irregular surface, but i do not appreciate a nodule--enlarged Lungs:  normal  respiratory effort and no intercostal retractions.   Heart:  normal rate and regular rhythm.   Abdomen:  Bowel sounds positive,abdomen soft and non-tender without masses, organomegaly or hernias noted. morbidly obese Msk:  No deformity or scoliosis noted of thoracic or lumbar spine.   Pulses:  R radial normal and L radial normal.   Neurologic:  cranial nerves II-XII intact and gait normal.   Skin:  turgor normal and color normal.     Impression & Recommendations:  Problem # 1:  OBESITY (ICD-278.00) this is her main problem--- advised increased exercise congratulated on weight loss to date  Problem # 2:  HYPERTENSION (ICD-401.9)  she has not been compliant with meds she requests liquid medication The following medications were removed from the medication list:    Lisinopril 40 Mg Tabs (Lisinopril) .Marland Kitchen... 1 by mouth once daily Her updated medication list for this problem includes:    Lisinopril 40 Mg Tabs (Lisinopril) .Marland Kitchen... 1 by mouth once daily  Orders: Venipuncture (81191) TLB-BMP (Basic Metabolic Panel-BMET) (80048-METABOL)  Complete Medication List: 1)  Miralax Powd (Polyethylene glycol 3350) .Marland Kitchen.. 17g by mouth once daily as needed constipation 2)  Lisinopril 40 Mg Tabs (Lisinopril) .Marland Kitchen.. 1 by mouth once daily  Other Orders: TLB-Lipid Panel (80061-LIPID) TLB-Hepatic/Liver Function Pnl (80076-HEPATIC) TLB-TSH (Thyroid Stimulating Hormone) (84443-TSH) TLB-CBC Platelet - w/Differential (85025-CBCD) TLB-Ferritin (82728-FER)  Patient Instructions: 1)  see me 4 weeks Prescriptions: LISINOPRIL 40 MG TABS (LISINOPRIL) 1 by mouth once daily  #90 x 3   Entered  and Authorized by:   Birdie Sons MD   Signed by:   Birdie Sons MD on 11/25/2010   Method used:   Electronically to        [facility]* (retail)       [address]       [address] Shipping/mailing       [place], [place]  [address]       Ph: [phone]       Fax: [phone]   RxID:    [id number] MIRALAX   POWD (POLYETHYLENE GLYCOL 3350) 17g by mouth once daily as needed constipation  #1 container x 3   Entered and Authorized by:   Birdie Sons MD   Signed by:   Birdie Sons MD on 11/25/2010   Method used:   Electronically to        [facility]* (retail)       [address]       [address] Shipping/mailing       [place], [place]  [address]       Ph: [phone]       Fax: [phone]   RxID:   [id number]    Orders Added: 1)  Venipuncture [88416] 2)  TLB-BMP (Basic Metabolic Panel-BMET) [80048-METABOL] 3)  TLB-Lipid Panel [80061-LIPID] 4)  TLB-Hepatic/Liver Function Pnl [80076-HEPATIC] 5)  TLB-TSH (Thyroid Stimulating Hormone) [84443-TSH] 6)  TLB-CBC Platelet - w/Differential [85025-CBCD] 7)  TLB-Ferritin [60630-ZSW]  Appended Document: Orders Update    Clinical Lists Changes  Orders: Added new Service order of Specimen Handling (10932) - Signed

## 2010-12-07 NOTE — Progress Notes (Signed)
Summary: med question  Phone Note Outgoing Call Call back at [facility] Phone [phone]   Call placed by: Alfred Levins, CMA,  November 28, 2010 1:18 PM Call placed to: Patient Reason for Call: Discuss lab or test results Summary of Call: gave pt normal lab results.  She has not taken her simvastatin since early part of December.  Can she stay off of it? Initial call taken by: Alfred Levins, CMA,  November 28, 2010 1:20 PM  Follow-up for Phone Call        yes , rechek in 3 months---see me one week later Follow-up by: Birdie Sons MD,  November 29, 2010 12:54 PM  Additional Follow-up for Phone Call Additional follow up Details #1::        pt aware Additional Follow-up by: Alfred Levins, CMA,  November 29, 2010 4:28 PM

## 2010-12-30 ENCOUNTER — Encounter: Payer: Self-pay | Admitting: Internal Medicine

## 2011-01-02 ENCOUNTER — Ambulatory Visit (INDEPENDENT_AMBULATORY_CARE_PROVIDER_SITE_OTHER): Payer: Commercial Managed Care - PPO | Admitting: Internal Medicine

## 2011-01-02 ENCOUNTER — Encounter: Payer: Self-pay | Admitting: Internal Medicine

## 2011-01-02 DIAGNOSIS — E042 Nontoxic multinodular goiter: Secondary | ICD-10-CM

## 2011-01-02 DIAGNOSIS — I10 Essential (primary) hypertension: Secondary | ICD-10-CM

## 2011-01-02 MED ORDER — AMLODIPINE BESYLATE 5 MG PO TABS: 5.0000 mg | ORAL_TABLET | Freq: Every day | ORAL | Status: DC

## 2011-01-02 NOTE — Progress Notes (Signed)
  Subjective:    Patient ID: Sharon Fowler, female    DOB: [birth year], 40 y.o.   MRN: [id number]  HPI Subjective:    Sharon Fowler is here for follow-up of her hypertension. Her high blood pressure was first noted 4 years ago. Home blood pressure readings: not doing. Salt intake and diet: salt added to cooking. Usual weight: 280 pounds. Associated signs and symptoms: none. Patient denies: blurred vision, chest pain, dyspnea and headache. Use of agents associated with hypertension: none. Medication compliance: taking as prescribed.  The following portions of the patient's history were reviewed and updated as appropriate: allergies, current medications, past family history, past medical history, past social history, past surgical history and problem list.   Review of Systems  patient denies chest pain, shortness of breath, orthopnea. Denies lower extremity edema, abdominal pain, change in appetite, change in bowel movements. Patient denies rashes, musculoskeletal complaints. No other specific complaints in a complete review of systems.     Objective:    Physical Exam:   Well-developed well-nourished female in no acute distress. HEENT exam atraumatic, normocephalic, extraocular muscles are intact. Neck is supple large, multinodular goiter. No jugular venous distention no thyromegaly. Chest clear to auscultation without increased work of breathing. Cardiac exam S1 and S2 are regular. Abdominal exam active bowel sounds, soft, nontender, morbidly obese. Extremities no edema. Neurologic exam she is alert without any motor sensory deficits. Gait is normal.   Lab Review   reviewed labs  Assessment:    Hypertension, stage 2 no. Evidence of target organ damage: none.    Plan:    Medication: continue lisinopril and begin amlodipine.  Side effects discussed   Review of Systems     Objective:   Physical Exam         Assessment & Plan:

## 2011-01-02 NOTE — Assessment & Plan Note (Signed)
Has been evaluated by dr Haynes Kerns

## 2011-01-02 NOTE — Assessment & Plan Note (Signed)
Not controlled Add amlodipine  Weight loss will be key to long term success Side effects discussed.

## 2011-01-04 ENCOUNTER — Encounter: Payer: Commercial Managed Care - PPO | Attending: Surgery | Admitting: *Deleted

## 2011-01-04 DIAGNOSIS — Z09 Encounter for follow-up examination after completed treatment for conditions other than malignant neoplasm: Secondary | ICD-10-CM | POA: Insufficient documentation

## 2011-01-04 DIAGNOSIS — Z713 Dietary counseling and surveillance: Secondary | ICD-10-CM | POA: Insufficient documentation

## 2011-01-04 DIAGNOSIS — Z9884 Bariatric surgery status: Secondary | ICD-10-CM | POA: Insufficient documentation

## 2011-01-10 LAB — DIFFERENTIAL
Monocytes Relative: 4 % (ref 3–12)
Neutrophils Relative %: 75 % (ref 43–77)

## 2011-01-10 LAB — CBC
MCH: 27.8 pg (ref 26.0–34.0)
MCV: 82.8 fL (ref 78.0–100.0)
Platelets: 320 10*3/uL (ref 150–400)
RDW: 14.8 % (ref 11.5–15.5)
WBC: 7.6 10*3/uL (ref 4.0–10.5)

## 2011-01-10 LAB — COMPREHENSIVE METABOLIC PANEL
ALT: 46 U/L — ABNORMAL HIGH (ref 0–35)
AST: 33 U/L (ref 0–37)
Calcium: 9.5 mg/dL (ref 8.4–10.5)
GFR calc Af Amer: >60 mL/min (ref >60)
Potassium: 3.7 mEq/L (ref 3.5–5.1)
Sodium: 138 mEq/L (ref 135–145)

## 2011-01-10 LAB — SURGICAL PCR SCREEN: Staphylococcus aureus: NEGATIVE

## 2011-01-10 LAB — PREGNANCY, URINE: Preg Test, Ur: NEGATIVE

## 2011-02-13 ENCOUNTER — Ambulatory Visit: Payer: Commercial Managed Care - PPO | Admitting: Internal Medicine

## 2011-02-14 LAB — URINALYSIS, ROUTINE W REFLEX MICROSCOPIC
Glucose, UA: NEGATIVE mg/dL
Protein, ur: NEGATIVE mg/dL
pH: 5.5 (ref 5.0–8.0)

## 2011-02-14 LAB — CBC
MCHC: 28.1 g/dL — ABNORMAL LOW (ref 30.0–36.0)
Platelets: 545 10*3/uL — ABNORMAL HIGH (ref 150–400)
RDW: 25.5 % — ABNORMAL HIGH (ref 11.5–15.5)
WBC: 15.5 10*3/uL — ABNORMAL HIGH (ref 4.0–10.5)

## 2011-02-21 ENCOUNTER — Other Ambulatory Visit: Payer: Self-pay | Admitting: Internal Medicine

## 2011-02-21 ENCOUNTER — Encounter: Payer: Self-pay | Admitting: Internal Medicine

## 2011-02-21 ENCOUNTER — Ambulatory Visit (INDEPENDENT_AMBULATORY_CARE_PROVIDER_SITE_OTHER): Payer: Commercial Managed Care - PPO | Admitting: Internal Medicine

## 2011-02-21 DIAGNOSIS — I10 Essential (primary) hypertension: Secondary | ICD-10-CM

## 2011-02-21 DIAGNOSIS — E669 Obesity, unspecified: Secondary | ICD-10-CM

## 2011-02-21 DIAGNOSIS — E785 Hyperlipidemia, unspecified: Secondary | ICD-10-CM

## 2011-02-21 DIAGNOSIS — N912 Amenorrhea, unspecified: Secondary | ICD-10-CM | POA: Insufficient documentation

## 2011-02-21 LAB — CBC WITH DIFFERENTIAL/PLATELET
Basophils Absolute: 0 10*3/uL (ref 0.0–0.1)
Eosinophils Absolute: 0.1 10*3/uL (ref 0.0–0.7)
Hemoglobin: 13 g/dL (ref 12.0–15.0)
Lymphocytes Relative: 22 % (ref 12.0–46.0)
Monocytes Relative: 5.2 % (ref 3.0–12.0)
Neutro Abs: 6.2 10*3/uL (ref 1.4–7.7)
Neutrophils Relative %: 71.2 % (ref 43.0–77.0)
RBC: 4.67 Mil/uL (ref 3.87–5.11)
RDW: 14.6 % (ref 11.5–14.6)

## 2011-02-21 LAB — LUTEINIZING HORMONE: LH: 26.9 m[IU]/mL

## 2011-02-21 NOTE — Assessment & Plan Note (Signed)
No need to treat

## 2011-02-21 NOTE — Assessment & Plan Note (Signed)
Given direction we'll check lab work. This could be related to her gastric banding and relatively rapid weight loss. I doubt this is menopausal. She does have followup with GYN in 3 months.

## 2011-02-21 NOTE — Progress Notes (Signed)
Addended by: Rossie Muskrat on: 02/21/2011 09:26 AM   Modules accepted: Orders

## 2011-02-21 NOTE — Assessment & Plan Note (Signed)
Adequate control Continue same meds 

## 2011-02-21 NOTE — Progress Notes (Signed)
Addended by: Rossie Muskrat on: 02/21/2011 09:22 AM   Modules accepted: Orders

## 2011-02-21 NOTE — Assessment & Plan Note (Signed)
She continues to lose weight congratulated

## 2011-02-21 NOTE — Progress Notes (Signed)
  Subjective:    Patient ID: Sharon Fowler, female    DOB: 24-Apr-1971, 40 y.o.   MRN: [id number]  HPI  Patient comes in for followup of hypertension. She is tolerating her medications. She has not checked her blood pressure at home.  Patient has obesity. She status post gastric banding. She continues to lose some weight but she feels a slower paced.  Patient has new complaint of amenorrhea. Has been ongoing for the past 4 months. She denies any night sweats or other menopausal symptoms.  Past Medical History  Diagnosis Date  . GOITER, MULTINODULAR 07/15/2009  . HYPERLIPIDEMIA 03/25/2008  . HYPERTENSION 03/25/2008  . OBESITY 03/25/2008  . ANEMIA, IRON DEFICIENCY UNSPEC 06/11/2009    corrected with iron replacement---menstrual reelated   Past Surgical History  Procedure Date  . Laparoscopic gastric banding   . Cholecystectomy 10/91    laparascopy  . Tubal ligation     01/2003  . Cesarean section 7/91, 5/95    x2    reports that she quit smoking about 20 years ago. She does not have any smokeless tobacco history on file. She reports that she does not drink alcohol. Her drug history not on file. family history includes Alcohol abuse in her father; Diabetes in her mother; and Stroke in her mother. Allergies  Allergen Reactions  . Chlorthalidone     REACTION: hypokalemia (2.8)     Review of Systems    patient denies chest pain, shortness of breath, orthopnea. Denies lower extremity edema, abdominal pain, change in appetite, change in bowel movements. Patient denies rashes, musculoskeletal complaints. No other specific complaints in a complete review of systems.    Objective:   Physical Exam Overweight female in no acute distress. HEENT exam atraumatic, normocephalic symmetric muscles are intact. Neck is supple without lymphadenopathy or thyromegaly. Chest clear to auscultation cardiac exam S1-S2 are regular. Abdominal exam overweight, bowel sounds, soft. Extremities no edema.        Assessment & Plan:

## 2011-02-22 LAB — HCG, QUANTITATIVE, PREGNANCY: hCG, Beta Chain, Quant, S: <2 m[IU]/mL

## 2011-03-14 NOTE — Op Note (Signed)
NAMEKEVA, Sharon Fowler                ACCOUNT NO.:  [id number]   MEDICAL RECORD NO.:  [id number]          PATIENT TYPE:  AMB   LOCATION:  SDC                           FACILITY:  [facility]   PHYSICIAN:  Carrington Clamp, M.D. DATE OF BIRTH:  1971-10-15   DATE OF PROCEDURE:  12/02/2008  DATE OF DISCHARGE:                               OPERATIVE REPORT   PREOPERATIVE DIAGNOSES:  1. Persistent low grade squamous epithelial lesion on Pap smear with      consistently suspicious for high-grade lesion.  Colposcopy was      inadequate in the office.  2. The patient is unable to tolerate the procedure in the office.   POSTOPERATIVE DIAGNOSES:  1. Persistent low grade squamous epithelial lesion on Pap smear with      consistently suspicious for high-grade lesion.  Colposcopy was      inadequate in the office.  2. The patient is unable to tolerate the procedure in the office.   PROCEDURE:  Loop electrocautery excision procedure cone.   SURGEON:  Carrington Clamp, MD   ANESTHESIA:  MAC.   FINDINGS:  A very small (AWL) acetowhite lesion inside the T-zone.   SPECIMENS:  Ectocervix stitch at 12 o'clock and endocervix stitch at  superior margin.   DISPOSITION:  To Pathology.   ESTIMATED BLOOD LOSS:  Minimal.   IV FLUIDS:  700 mL.   URINE OUTPUT:  Not measured.   COUNTS:  Correct x3.   REASON FOR OPERATION:  [name] has had persistently abnormal Pap  smears over the last several years.  Her colposcopy was last performed  in 2006, that is when she had a LGSIL Pap with a lesion suggestive of  high-grade lesion followed by a repeat colposcopy.  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  A repeat Pap after that again indicated there  were some cells of a high-grade lesion and because of the possibility of  an adequate colposcopy, it was decided to proceed with a LEEP cone.  The  patient was scheduled and had actually been at the office for the  procedure; however, she was unable to tolerate  being able to have the  procedure in the office and therefore was decided to do in the operating  room.   TECHNIQUE:  After adequate anesthesia was achieved, the patient was  prepped and draped in the usual fashion.  A speculum was placed in the  vagina and acetic acid was placed on the cervix.  Colposcope was used to  find the above findings.  The cervix was injected with 0.5% lidocaine  with epinephrine in 4 quadrants.  Lugol's was applied and a 2- x 1-cm  loop was used to remove a portion of the ectocervix, and 1- x 1-cm loop  was then used to remove a portion of the endocervical canal.  It is  unclear if I was able to get the entirety of the circumference of the  endocervical canal, but it did look like I had gotten a significant  piece.  Each of these was then labeled and marked for pathology  identification.  Monsel's was applied after rollerball cautery, and all  instruments were withdrawn from the vagina.  The patient tolerated the  procedure well and was transferred to recovery room in stable condition.      Carrington Clamp, M.D.  Electronically Signed     MH/MEDQ  D:  12/02/2008  T:  12/03/2008  Job:  [id number]

## 2011-03-17 NOTE — Op Note (Signed)
NAME:  Sharon Fowler, Sharon Fowler                     ACCOUNT NO.:  [id number]   MEDICAL RECORD NO.:  [id number]                   PATIENT TYPE:  AMB   LOCATION:  SDC                                  FACILITY:  [facility]   PHYSICIAN:  Carrington Clamp, M.D.              DATE OF BIRTH:  September 16, 1971   DATE OF PROCEDURE:  02/17/2003  DATE OF DISCHARGE:                                 OPERATIVE REPORT   PREOPERATIVE DIAGNOSES:  Multiparous, desires permanent sterility.   POSTOPERATIVE DIAGNOSES:  Multiparous, desires permanent sterility.   PROCEDURE:  Filshie clip laparoscopic bilateral tubal ligation.   ATTENDING:  Carrington Clamp, M.D.   ANESTHESIA:  General endotracheal anesthesia.   ESTIMATED BLOOD LOSS:  Minimal.   IV FLUIDS:  3000 mL.   URINE OUTPUT:  Not measured.   COMPLICATIONS:  None.   FINDINGS:  Normal tubes, ovaries, and uterus.   MEDICATIONS:  None.   PATHOLOGY:  None.   COUNTS:  Correct x3.   TECHNIQUE:  After adequate general anesthesia was achieved the patient was  prepped and draped in the usual sterile fashion in dorsal lithotomy  position.  Speculum was placed in the vagina and uterine manipulator placed  easily into the cervix and stabilized on the cervix.  Red rubber catheter  was then used to drain the bladder and the speculum was removed.  Attention  was then turned to the abdomen where a 2 cm infraumbilical incision was made  with a scalpel.  The Veress needle was passed into the abdomen two times and  then changed to the longer needle.  On the third pass the opening pressure  was about 11 and insufflation commenced.  There was no aspiration of bowel  contents or blood at either of the three times the Veress needle was passed  into the abdomen.   The 10 mm trocar was then placed into the abdomen and the bowels and abdomen  inspected and found to be without trauma.  The abdomen was insufflated and  the above findings noted.  Filshie clip instrument was  placed into the  abdomen and a single clip was placed all the way around the tube to the  mesosalpinx on the left-hand side.  On the right-hand side the first two  clip attempts did not seem to go all the way around the tube so a third clip  was placed closer to the cornua which this time was clearly around the  entire tube.  Pictures were taken of both tubes indicating the clips all the  way around the tube into the mesosalpinx.   All the instruments were withdrawn from the abdomen, the abdomen  desufflated.  The 10 mm trocar incision site was closed with a figure-of-  eight stitch of 2-0 Vicryl.  The skin was closed with Dermabond and all  instruments were withdrawn from the vagina.  The patient tolerated the  procedure well.  Was returned to recovery  room in stable condition.                                               Carrington Clamp, M.D.    MH/MEDQ  D:  02/17/2003  T:  02/17/2003  Job:  [id number]

## 2011-04-04 ENCOUNTER — Encounter (INDEPENDENT_AMBULATORY_CARE_PROVIDER_SITE_OTHER): Payer: Self-pay | Admitting: General Surgery

## 2011-04-06 ENCOUNTER — Encounter: Payer: 59 | Attending: Surgery | Admitting: *Deleted

## 2011-04-06 DIAGNOSIS — Z9884 Bariatric surgery status: Secondary | ICD-10-CM | POA: Insufficient documentation

## 2011-04-06 DIAGNOSIS — Z09 Encounter for follow-up examination after completed treatment for conditions other than malignant neoplasm: Secondary | ICD-10-CM | POA: Insufficient documentation

## 2011-04-06 DIAGNOSIS — Z713 Dietary counseling and surveillance: Secondary | ICD-10-CM | POA: Insufficient documentation

## 2011-05-26 ENCOUNTER — Ambulatory Visit (INDEPENDENT_AMBULATORY_CARE_PROVIDER_SITE_OTHER): Payer: Commercial Managed Care - PPO | Admitting: General Surgery

## 2011-05-26 VITALS — BP 158/98 | Ht 64.0 in | Wt 228.0 lb

## 2011-05-26 DIAGNOSIS — Z4651 Encounter for fitting and adjustment of gastric lap band: Secondary | ICD-10-CM

## 2011-05-26 NOTE — Progress Notes (Signed)
The patient returns for followup status post lap band placement October 04, 2010. She had a fill 2 months ago. Since then she has been very intolerant of solid food. She has been trying to stick it out but essentially has daily vomiting with solids.  On examination her weight is down 25 pounds in 2 months for total weight loss of 60 pounds from preop of 288 current 228. BMI is down from preop of 49.2 the current 38.9  She clearly is over restricted. We elected to remove half of the previous fill and removed a 0.25 cc. I gave her an appointment for 2 months and she will call if this does not give her relief.

## 2011-05-26 NOTE — Patient Instructions (Signed)
Call if not feeling better.

## 2011-06-30 ENCOUNTER — Other Ambulatory Visit: Payer: Self-pay | Admitting: Obstetrics and Gynecology

## 2011-07-10 ENCOUNTER — Ambulatory Visit: Payer: Commercial Managed Care - PPO | Admitting: *Deleted

## 2011-07-11 ENCOUNTER — Encounter: Payer: Self-pay | Admitting: *Deleted

## 2011-07-11 ENCOUNTER — Encounter: Payer: 59 | Attending: Surgery | Admitting: *Deleted

## 2011-07-11 DIAGNOSIS — Z713 Dietary counseling and surveillance: Secondary | ICD-10-CM | POA: Insufficient documentation

## 2011-07-11 DIAGNOSIS — Z9884 Bariatric surgery status: Secondary | ICD-10-CM | POA: Insufficient documentation

## 2011-07-11 DIAGNOSIS — Z09 Encounter for follow-up examination after completed treatment for conditions other than malignant neoplasm: Secondary | ICD-10-CM | POA: Insufficient documentation

## 2011-07-11 NOTE — Patient Instructions (Signed)
Goals:  Follow Phase 3B: High Protein + Non-Starchy Vegetables  Eat 3-6 small meals/snacks, every 3-5 hrs  Increase lean protein foods to meet 80g goal  Increase fluid intake to 64oz +  Increase calorie/protein intake  Continue current physical activity levels daily

## 2011-07-11 NOTE — Progress Notes (Signed)
  Follow-up visit: 9 Months Post-Operative LAGB Surgery  Medical Nutrition Therapy:  Appt start time: 1100 end time:  1130.  Assessment:  Primary concerns today: post-operative bariatric surgery nutrition management. Sharon Fowler reports that her last fill was too tight and she had multiple episodes of n/v. She had a band fill removal on 7/27 by Dr. Johna Sheriff, which helped her symptoms, yet now she feels that her portions are too large and she does not feel like she is getting in enough protein.  Weight today: 225.3 lbs Weight change: 17.7 lbs since June 2012  Total weight lost: 70.3 lbs total BMI: 38.8% Weight goal: 150 lbs % Weight goal met: 50%  24-hr recall:  B (AM): Skips Snk (AM): Skips   L (11-3 AM): Scoop of chicken salad OR 3 chicken tenders Snk (PM): Skips  D (PM): Skips Snk (PM): Skips  Fluid intake: Sobe Light, Water, Crystal Light = 40 oz Estimated total protein intake: 20-40g  Medications: No changes. Pt on BP medications Supplementation: Taking regularly  Using straws: No Drinking while eating: No Hair loss: No Carbonated beverages: No N/V/D/C: No Last Lap-Band fill: 1/2 cc removal from band on 7/27  Recent physical activity:  Exercise classes, 3 times/week for 60 minutes + (Zumba, Step, Aerobics)  Progress Towards Goal(s):  In progress.  Handouts given during visit include:  Unjury Protein Shakes   Nutritional Diagnosis:  Inadequate protein intake related to small portions of protein foods/one meal per day as evidenced by pt consuming 25% of estimated protein needs.    Intervention:  Nutrition education.  Monitoring/Evaluation:  Dietary intake, exercise, lap band fills, and body weight. Follow up in 3 months for 12 month post-op visit.

## 2011-08-08 ENCOUNTER — Encounter (INDEPENDENT_AMBULATORY_CARE_PROVIDER_SITE_OTHER): Payer: Self-pay | Admitting: General Surgery

## 2011-08-11 ENCOUNTER — Encounter (INDEPENDENT_AMBULATORY_CARE_PROVIDER_SITE_OTHER): Payer: Self-pay

## 2011-08-11 ENCOUNTER — Ambulatory Visit (INDEPENDENT_AMBULATORY_CARE_PROVIDER_SITE_OTHER): Payer: Commercial Managed Care - PPO | Admitting: Physician Assistant

## 2011-08-11 NOTE — Patient Instructions (Signed)
Continue your physical activity regimen. Follow dietary guidelines per Amy's recommendations including obtaining an adequate number of calories. Return in 4-6 weeks or sooner, especially if you have increased hunger or portion sizes.

## 2011-08-11 NOTE — Progress Notes (Signed)
  HISTORY: Sharon Fowler is a 40 y.o.female who received an AP-Standard lap-band in December 2011 by Dr. Johna Sheriff. She says her weight has stagnated at 221 for the past 2-3 weeks on her work scales but is continuing to take small portion sizes which keep her satisfied. She has seen Amy in the past month and says that she's aware of her approx. 600cal/day is not adequate. She has 1-2 episodes of regurgitation every other week or so which she attributes in some cases to eating that "just one bite more". She believes a fill today will help her lose weight.  VITAL SIGNS: Filed Vitals:   08/11/11 1034  BP: 126/84  Pulse: 70  Resp: 16    PHYSICAL EXAM: Physical exam reveals a very well-appearing 40 y.o.female in no apparent distress Neurologic: Awake, alert, oriented Psych: Bright affect, conversant Respiratory: Breathing even and unlabored. No stridor or wheezing Extremities: Atraumatic, good range of motion. Skin: Warm, Dry, no rashes Musculoskeletal: Normal gait, Joints normal  ASSESMENT: 40 y.o.  female  s/p AP-Standard lap-band.   PLAN: We had a lengthy discussion on how her calorie intake is not adequate and that despite her weight not changing, she's in the green zone with regard to the band. Her portion sizes remain small and her satiety lasts anywhere from 5-6 hours on those portions. We discussed how a band fill today would likely cause obstructive symptoms which would further stymie her weight loss efforts. I recommended following Amy's guidelines on diet and to return to see [place] in a month or so.

## 2011-08-23 ENCOUNTER — Encounter: Payer: Self-pay | Admitting: Internal Medicine

## 2011-08-23 ENCOUNTER — Ambulatory Visit (INDEPENDENT_AMBULATORY_CARE_PROVIDER_SITE_OTHER): Payer: Self-pay | Admitting: Internal Medicine

## 2011-08-23 DIAGNOSIS — I10 Essential (primary) hypertension: Secondary | ICD-10-CM

## 2011-08-23 NOTE — Assessment & Plan Note (Signed)
Blood pressure is adequately controlled. She has done a great job with weight loss. I've encouraged her to continue her weight loss program. I think eventually she will get off of some blood pressure medications. I'll see her in 6 months for a physical exam.

## 2011-08-23 NOTE — Progress Notes (Signed)
  Subjective:    Patient ID: Sharon Fowler, female    DOB: [birth year], 40 y.o.   MRN: [id number]  HPI htn- tolerating meds "when i remember to take them". She works 3rd shift at [employer].  Feels well  Past Medical History  Diagnosis Date  . GOITER, MULTINODULAR 07/15/2009  . HYPERLIPIDEMIA 03/25/2008  . HYPERTENSION 03/25/2008  . OBESITY 03/25/2008  . ANEMIA, IRON DEFICIENCY UNSPEC 06/11/2009    corrected with iron replacement---menstrual reelated   Past Surgical History  Procedure Date  . Laparoscopic gastric banding   . Cholecystectomy 10/91    laparascopy  . Tubal ligation     01/2003  . Cesarean section 7/91, 5/95    x2    reports that she quit smoking about 20 years ago. She does not have any smokeless tobacco history on file. She reports that she does not drink alcohol. Her drug history not on file. family history includes Alcohol abuse in her father; Diabetes in her mother; and Stroke in her mother. Allergies  Allergen Reactions  . Chlorthalidone     REACTION: hypokalemia (2.8)   Review of Systems  patient denies chest pain, shortness of breath, orthopnea. Denies lower extremity edema, abdominal pain, change in appetite, change in bowel movements. Patient denies rashes, musculoskeletal complaints. No other specific complaints in a complete review of systems.      Objective:   Physical Exam  Well-developed well-nourished female in no acute distress. HEENT exam atraumatic, normocephalic, extraocular muscles are intact. Neck is supple. No jugular venous distention no thyromegaly. Chest clear to auscultation without increased work of breathing. Cardiac exam S1 and S2 are regular. Abdominal exam active bowel sounds, soft, nontender. Extremities no edema.     Assessment & Plan:

## 2011-09-29 ENCOUNTER — Encounter (INDEPENDENT_AMBULATORY_CARE_PROVIDER_SITE_OTHER): Payer: Commercial Managed Care - PPO

## 2011-10-05 ENCOUNTER — Encounter: Payer: 59 | Attending: Surgery | Admitting: *Deleted

## 2011-10-05 ENCOUNTER — Encounter: Payer: Self-pay | Admitting: *Deleted

## 2011-10-05 DIAGNOSIS — Z9884 Bariatric surgery status: Secondary | ICD-10-CM | POA: Insufficient documentation

## 2011-10-05 DIAGNOSIS — Z09 Encounter for follow-up examination after completed treatment for conditions other than malignant neoplasm: Secondary | ICD-10-CM | POA: Insufficient documentation

## 2011-10-05 DIAGNOSIS — Z713 Dietary counseling and surveillance: Secondary | ICD-10-CM | POA: Insufficient documentation

## 2011-10-05 NOTE — Patient Instructions (Addendum)
Goals:  Follow Phase 4: High Protein, Low-Carbohydrate Diet  Increase calories to 800 -1000/day  Try making a smoothie to add calories:  1 scoop protein  1 - 6oz yogurt  1/4 - 1/2 cup almond milk  1 small piece fruit  *1-2 tbsp peanut butter  Ice  Eat 3-6 small meals/snacks, every 3-5 hrs  Increase lean protein foods to meet 80g goal  Aim for >30 min of physical activity daily             [email or web address]

## 2011-10-05 NOTE — Progress Notes (Signed)
  Follow-up visit: 12 Months Post-Operative LAGB Surgery  Medical Nutrition Therapy:  Appt start time: 0750 end time:  0820.  Assessment:  Primary concerns today: post-operative bariatric surgery nutrition management. Sharon Fowler is here for her year post-op nutrition follow-up appointment and has lost a total of 81.1 lbs since the start of her journey at [facility]. She notes that she is happy with weight loss yet eating and exericse is still not where she would like it to be yet she continues to work on this.  Weight today: 214.5 lbs Weight change: 10.8 lbs Total weight lost: 81.1 lbs total  BMI: 36.9% Weight goal: 150 lbs % Weight goal met: 55%  Surgery date: 12/6/12Start weight at [facility]: 295.6 lbs  24-hr recall:  B (7 AM): oatmeal (instant) w/ 1/2 scoop protein powder (10g) Snk (AM): SKIPS    L (PM): SKIPS Snk (PM): SKIPS OR Occasional protein bar D (7-8 PM): Chicken salad (1/2 cup) OR Green salad w/ grilled chicken (2 oz protein) Snk (PM): SKIPS  Fluid intake: water, Sobe Light = 64 oz Estimated total protein intake: 30-40g  Medications: No changes Supplementation: Not taking any supplements at this time due to intolerance  Using straws: No Drinking while eating: No Hair loss: No Carbonated beverages: No N/V/D/C: None reported Last Lap-Band fill: No recent band fill  Recent physical activity:  Limited activity levels due to low energy, busy schedule, and cooler weather  Progress Towards Goal(s):  In progress.  Handouts given during visit include:  Phase 4 - High Protein, Low Carb Diet   Nutritional Diagnosis:  NI-5.7.1 Inadequate protein intake As related to frequent meal skipping and small portions of protein rich foods.  As evidenced by pt meeting <50% of recommended protein needs.    Intervention:  Nutrition education and encouragement.  Monitoring/Evaluation:  Dietary intake, exercise, lap band fills, and body weight. Follow up PRN

## 2012-01-03 ENCOUNTER — Ambulatory Visit: Payer: 59 | Admitting: *Deleted

## 2012-01-08 ENCOUNTER — Encounter: Payer: Self-pay | Admitting: *Deleted

## 2012-01-08 ENCOUNTER — Encounter: Payer: 59 | Attending: Surgery | Admitting: *Deleted

## 2012-01-08 DIAGNOSIS — Z09 Encounter for follow-up examination after completed treatment for conditions other than malignant neoplasm: Secondary | ICD-10-CM | POA: Insufficient documentation

## 2012-01-08 DIAGNOSIS — Z9884 Bariatric surgery status: Secondary | ICD-10-CM | POA: Insufficient documentation

## 2012-01-08 DIAGNOSIS — Z713 Dietary counseling and surveillance: Secondary | ICD-10-CM | POA: Insufficient documentation

## 2012-01-08 NOTE — Patient Instructions (Signed)
Goals:  Follow Phase 4: High Protein, Low-Carbohydrate Diet  Increase calories to 800 -1000/day  Try making a smoothie to add calories:  1 scoop protein  1 - 6oz yogurt  1/4 - 1/2 cup almond milk  1 small piece fruit  *1-2 tbsp peanut butter  Ice  Eat 3-6 small meals/snacks, every 3-5 hrs  Increase lean protein foods to meet 80g goal  Aim for >30 min of physical activity daily

## 2012-01-08 NOTE — Progress Notes (Signed)
  Follow-up visit: 15 Months Post-Operative LAGB Surgery  Medical Nutrition Therapy:  Appt start time: 1100 end time:  1130.  Assessment:  Primary concerns today: post-operative bariatric surgery nutrition management. Sharon Fowler reports that she is at a "stand still" with her weight loss however when she eats she has embarrassing and chronic gas/belching. She is to see [name] this week however she doesn't feel like she needs a band fill as she is comfortable with her current restriction. She is worried about what is going on with her band. It takes her over an hour to eat 2 oz of chicken and feels like she may be restricted too much as she is only eating 350-500 calories/day. She states the "I can't eat and enjoy my food like I feel that I should". Pt notes that eating quality of life is very poor.  Weight today: 215 lbs Weight change: n/a Total weight lost: 81.1 lbs total  BMI: 36.9% Weight goal: 150 lbs % Weight goal met: 55%  Surgery date: 10/05/11 Start weight at [facility]: 295.6 lbs  24-hr recall:  B (7 AM): oatmeal (instant) w/ 1/2 scoop protein powder (10g) OR 1 hard boiled egg Snk (AM): SKIPS   L (PM): SKIPS Snk (PM): Protein bar D (7-8 PM): Chicken salad (1/4 cup) OR Green salad w/ grilled chicken (2 oz protein) Snk (PM): SKIPS *Pt is only consuming 350-500 kcals per day due to very small portions at one sitting.  Fluid intake: sugar-free mix-ins, water, Sobe Light = 64 oz Estimated total protein intake: 55-60g  Medications: No changes Supplementation: Not taking any supplements at this time due to intolerance  Using straws: No Drinking while eating: No Hair loss: No Carbonated beverages: No N/V/D/C: Pt reports chronic belching and gas Last Lap-Band fill: No recent band fill; Pt to see [name] this week  Recent physical activity:  Limited activity levels due to low energy, busy schedule, and cooler weather; Pt states that she plans to begin exercise once it warms up  Progress  Towards Goal(s):  In progress.  Handouts given during visit include:  Phase 4 - High Protein, Low Carb Diet   Nutritional Diagnosis:  Altered GI function related to LAGB as evidenced by pt with consistent and chronic belching and gas pain, believed to be related to the band.    Intervention:  Nutrition education and encouragement.  Monitoring/Evaluation:  Dietary intake, exercise, lap band fills, and body weight. Follow up PRN

## 2012-01-11 ENCOUNTER — Ambulatory Visit (INDEPENDENT_AMBULATORY_CARE_PROVIDER_SITE_OTHER): Payer: Commercial Managed Care - PPO | Admitting: Physician Assistant

## 2012-01-11 ENCOUNTER — Encounter (INDEPENDENT_AMBULATORY_CARE_PROVIDER_SITE_OTHER): Payer: Self-pay

## 2012-01-11 NOTE — Patient Instructions (Signed)
Obtain upper GI xray then follow-up with [place] after your test.

## 2012-01-11 NOTE — Progress Notes (Signed)
  HISTORY: Sharon Fowler is a 41 y.o.female who received an AP-Standard lap-band in December 2011 by Dr. Johna Sheriff. She presents with an unusual complaint. She is able to tolerate liquids without difficulty but solid foods create an uncomfortable amount of upper GI gas. She states that she chews adequately but is able to swallow only two bites of chicken before she feels too full. She burps then is able to resume. It takes her an hour to eat her meal some days.  VITAL SIGNS: Filed Vitals:   01/11/12 1112  BP: 130/90  Pulse: 72  Temp: 97.2 F (36.2 C)  Resp: 18    PHYSICAL EXAM: Physical exam reveals a very well-appearing 41 y.o.female in no apparent distress Neurologic: Awake, alert, oriented Psych: Bright affect, conversant Respiratory: Breathing even and unlabored. No stridor or wheezing Extremities: Atraumatic, good range of motion. Skin: Warm, Dry, no rashes Musculoskeletal: Normal gait, Joints normal  ASSESMENT: 41 y.o.  female  s/p AP-Standard lap-band.   PLAN: I've ordered an upper GI to evaluate her anatomy. I explained that burping is normally secondary to ingestion of air, but I'm not certain how she's doing this excessively. We'll have her back following her study.

## 2012-01-17 ENCOUNTER — Ambulatory Visit
Admission: RE | Admit: 2012-01-17 | Discharge: 2012-01-17 | Disposition: A | Discharge status: Home / Self Care | Payer: 59 | Source: Ambulatory Visit | Attending: Physician Assistant | Admitting: Physician Assistant

## 2012-02-13 ENCOUNTER — Other Ambulatory Visit (INDEPENDENT_AMBULATORY_CARE_PROVIDER_SITE_OTHER): Payer: 59

## 2012-02-13 DIAGNOSIS — Z0000 Encounter for general adult medical examination without abnormal findings: Secondary | ICD-10-CM

## 2012-02-13 LAB — BASIC METABOLIC PANEL
CO2: 27 mEq/L (ref 19–32)
Glucose, Bld: 72 mg/dL (ref 70–99)
Potassium: 4.4 mEq/L (ref 3.5–5.1)
Sodium: 140 mEq/L (ref 135–145)

## 2012-02-13 LAB — CBC WITH DIFFERENTIAL/PLATELET
Basophils Absolute: 0 10*3/uL (ref 0.0–0.1)
Basophils Relative: 0.5 % (ref 0.0–3.0)
Eosinophils Absolute: 0.1 10*3/uL (ref 0.0–0.7)
HCT: 42.3 % (ref 36.0–46.0)
Hemoglobin: 14 g/dL (ref 12.0–15.0)
Lymphocytes Relative: 29.3 % (ref 12.0–46.0)
Lymphs Abs: 2.1 10*3/uL (ref 0.7–4.0)
MCHC: 33 g/dL (ref 30.0–36.0)
Monocytes Relative: 5.7 % (ref 3.0–12.0)
Neutro Abs: 4.6 10*3/uL (ref 1.4–7.7)
RBC: 4.96 Mil/uL (ref 3.87–5.11)
RDW: 14.4 % (ref 11.5–14.6)

## 2012-02-13 LAB — TSH: TSH: 0.6 u[IU]/mL (ref 0.35–5.50)

## 2012-02-13 LAB — POCT URINALYSIS DIPSTICK
Blood, UA: NEGATIVE
Glucose, UA: NEGATIVE
Nitrite, UA: NEGATIVE
Spec Grav, UA: >=1.03
Urobilinogen, UA: 2

## 2012-02-13 LAB — LIPID PANEL: Total CHOL/HDL Ratio: 4

## 2012-02-13 LAB — HEPATIC FUNCTION PANEL
AST: 15 U/L (ref 0–37)
Albumin: 4.2 g/dL (ref 3.5–5.2)
Alkaline Phosphatase: 72 U/L (ref 39–117)
Total Protein: 7.9 g/dL (ref 6.0–8.3)

## 2012-02-13 LAB — LDL CHOLESTEROL, DIRECT: Direct LDL: 182.6 mg/dL

## 2012-02-15 ENCOUNTER — Encounter (INDEPENDENT_AMBULATORY_CARE_PROVIDER_SITE_OTHER): Payer: Self-pay

## 2012-02-15 ENCOUNTER — Ambulatory Visit (INDEPENDENT_AMBULATORY_CARE_PROVIDER_SITE_OTHER): Payer: Commercial Managed Care - PPO | Admitting: Physician Assistant

## 2012-02-15 NOTE — Patient Instructions (Signed)
Return in six months or sooner if needed, especially if you have increasing hunger or portion sizes.

## 2012-02-15 NOTE — Progress Notes (Signed)
  HISTORY: Sharon Fowler is a 41 y.o.female who received an AP-Standard lap-band in December 2011 by Dr. Johna Sheriff. She comes in today with no new complaints. She's lost over six lbs in the past month or so and is feeling good. She's begun exercising more and has noticed that since her stress level has diminished, she's burping far less. She does not find herself hungry and she says her portions are under good control.  VITAL SIGNS: Filed Vitals:   02/15/12 0926  BP: 124/82    PHYSICAL EXAM: Physical exam reveals a very well-appearing 41 y.o.female in no apparent distress Neurologic: Awake, alert, oriented Psych: Bright affect, conversant Respiratory: Breathing even and unlabored. No stridor or wheezing Extremities: Atraumatic, good range of motion. Skin: Warm, Dry, no rashes Musculoskeletal: Normal gait, Joints normal  ASSESMENT: 42 y.o.  female  s/p AP-Standard lap-band.   PLAN: She did not desire an adjustment today. We'll have her back in six months. We discussed some general guidelines to help her continue losing weight and indications to call us for a fill. She voiced understanding and agreement.

## 2012-02-19 ENCOUNTER — Encounter: Payer: Self-pay | Admitting: Internal Medicine

## 2012-02-19 ENCOUNTER — Ambulatory Visit (INDEPENDENT_AMBULATORY_CARE_PROVIDER_SITE_OTHER): Payer: Self-pay | Admitting: Internal Medicine

## 2012-02-19 VITALS — BP 134/86 | HR 84 | Temp 97.8°F | Ht 63.5 in | Wt 207.0 lb

## 2012-02-19 DIAGNOSIS — E785 Hyperlipidemia, unspecified: Secondary | ICD-10-CM

## 2012-02-19 DIAGNOSIS — Z0000 Encounter for general adult medical examination without abnormal findings: Secondary | ICD-10-CM

## 2012-02-19 DIAGNOSIS — I10 Essential (primary) hypertension: Secondary | ICD-10-CM

## 2012-02-19 MED ORDER — ATORVASTATIN CALCIUM 10 MG PO TABS: 10.0000 mg | ORAL_TABLET | Freq: Every day | ORAL | Status: DC

## 2012-02-19 MED ORDER — AMLODIPINE BESYLATE 5 MG PO TABS: 5.0000 mg | ORAL_TABLET | Freq: Every day | ORAL | Status: DC

## 2012-02-19 MED ORDER — LISINOPRIL 40 MG PO TABS: 40.0000 mg | ORAL_TABLET | Freq: Every day | ORAL | Status: DC

## 2012-02-19 NOTE — Assessment & Plan Note (Signed)
Start atorvastatin 5 mg po qd

## 2012-02-19 NOTE — Progress Notes (Signed)
Patient ID: Sharon Fowler, female   DOB: 1971/01/27, 41 y.o.   MRN: [id number] cpx  Past Medical History  Diagnosis Date  . GOITER, MULTINODULAR 07/15/2009  . HYPERLIPIDEMIA 03/25/2008  . HYPERTENSION 03/25/2008  . OBESITY 03/25/2008  . ANEMIA, IRON DEFICIENCY UNSPEC 06/11/2009    corrected with iron replacement---menstrual reelated    History   Social History  . Marital Status: Divorced    Spouse Name: N/A    Number of Children: N/A  . Years of Education: N/A   Occupational History  . Not on file.   Social History Main Topics  . Smoking status: Former Smoker    Quit date: 01/02/1991  . Smokeless tobacco: Not on file  . Alcohol Use: No  . Drug Use: No  . Sexually Active: Not on file   Other Topics Concern  . Not on file   Social History Narrative  . No narrative on file    Past Surgical History  Procedure Date  . Laparoscopic gastric banding   . Cholecystectomy 10/91    laparascopy  . Tubal ligation     01/2003  . Cesarean section 7/91, 5/95    x2    Family History  Problem Relation Age of Onset  . Diabetes Mother   . Stroke Mother   . Alcohol abuse Father     Allergies  Allergen Reactions  . Chlorthalidone     REACTION: hypokalemia (2.8)    Current Outpatient Prescriptions on File Prior to Visit  Medication Sig Dispense Refill  . amLODipine (NORVASC) 5 MG tablet Take 5 mg by mouth daily.      Marland Kitchen lisinopril (PRINIVIL,ZESTRIL) 40 MG tablet Take 40 mg by mouth daily.        . polyethylene glycol (MIRALAX) powder Take 17 g by mouth daily.           patient denies chest pain, shortness of breath, orthopnea. Denies lower extremity edema, abdominal pain, change in appetite, change in bowel movements. Patient denies rashes, musculoskeletal complaints. No other specific complaints in a complete review of systems.   BP 134/86  Pulse 84  Temp(Src) 97.8 F (36.6 C) (Oral)  Ht 5' 3.5" (1.613 m)  Wt 207 lb (93.895 kg)  BMI 36.09 kg/m2  Well-developed  well-nourished female in no acute distress. HEENT exam atraumatic, normocephalic, extraocular muscles are intact. Neck is supple. No jugular venous distention, she has goiter. Chest clear to auscultation without increased work of breathing. Cardiac exam S1 and S2 are regular. Abdominal exam active bowel sounds, soft, nontender. Extremities no edema. Neurologic exam she is alert without any motor sensory deficits. Gait is normal.  A/P: well Visit - health maint UTD  Weight loss encouraged

## 2012-02-21 ENCOUNTER — Encounter: Payer: Self-pay | Admitting: Internal Medicine

## 2012-04-09 ENCOUNTER — Encounter: Payer: Self-pay | Admitting: *Deleted

## 2012-04-09 ENCOUNTER — Encounter: Payer: 59 | Attending: Surgery | Admitting: *Deleted

## 2012-04-09 VITALS — Ht 64.0 in | Wt 203.5 lb

## 2012-04-09 DIAGNOSIS — Z9884 Bariatric surgery status: Secondary | ICD-10-CM | POA: Insufficient documentation

## 2012-04-09 DIAGNOSIS — Z09 Encounter for follow-up examination after completed treatment for conditions other than malignant neoplasm: Secondary | ICD-10-CM | POA: Insufficient documentation

## 2012-04-09 DIAGNOSIS — Z713 Dietary counseling and surveillance: Secondary | ICD-10-CM | POA: Insufficient documentation

## 2012-04-09 DIAGNOSIS — E669 Obesity, unspecified: Secondary | ICD-10-CM

## 2012-04-09 NOTE — Progress Notes (Addendum)
  Follow-up visit: 18 Months Post-Operative LAGB Surgery  Medical Nutrition Therapy:  Appt start time: 1100 end time:  1130.  Assessment:  Primary concerns today: post-operative bariatric surgery nutrition management.  Sharon Fowler reports frustration that she is unable to get under 200 lbs.  Chronic gas/belching continues when she eats anything.  Sharon Fowler referred her for modified barium swallow in March; results were WNL. Continues to eat only 350-500 calories/day.  Eats 2 meals (7 am & 12:30 am) and no snacks. Discussed this is likely the cause of slowed weight loss. May also be the cause of gas/belching when her meal hits a very empty pouch.  Also discussed resuming supplements and gave samples.   Weight today: 204.0 lbs Weight change: - 11.5 lbs Total weight lost: 92.6 lbs total  BMI: 35.0 kg/m^2 Weight goal: 170 lbs (pt reports increased goal from 150 lbs) % Weight goal met: 74% (of new goal)  Surgery date: 10/04/10 Start weight at [facility]: 295.6 lbs  24-hr recall: B (7 AM): oatmeal (instant) w/ 1/2 scoop protein powder (10g) OR 1-2 scrambled eggs w/ cheese & 1-2 pcs [place] bacon or sausage (often only gets "bites" of eggs in) Snk (AM): SKIPS (sleeping) L (PM): SKIPS (sleeping) Snk (PM): SKIPS ("I'm usually rushing to get to work") D (12:30-1 AM): 4 chicken fingers w/ ranch dressing ([employer]) OR scoop chicken salad Snk (PM): SKIPS   *Pt is only consuming 350-500 kcals per day due to very small portions at one sitting.  Fluid intake: sugar-free mix-ins, water = 1.5 liters (~50 oz) Estimated total protein intake: 55-60g  Medications: No changes Supplementation: Not taking any supplements at this time due to intolerance  Using straws: Only at restaurants - will not drink directly out of their cups Drinking while eating: No Hair loss: No Carbonated beverages: No (rare) N/V/D/C: Pt reports chronic belching and gas when eating anything; Sometimes has N/V Last Lap-Band fill: No recent band  fill   Recent physical activity:  Walks 2x/day on weekends.   Progress Towards Goal(s):  In progress.   Nutritional Diagnosis:  Altered GI function related to LAGB as evidenced by pt with consistent and chronic belching and gas pain, believed to be related to the band.    Samples dispensed during visit:   Unjury Unflavored Protein Powder: 3 ea; Lot # [id number]; Exp: 08/14  Celebrate MVI (pine-straw): 1 ea: Lot # [id number]; Exp: 09/14  Celebrate MVI-Complete: 2 ea; Lot # [id number]; Exp: 11/14  Celebrate MVI (mandarin orange): 2 ea; Lot # [id number]; Exp: 07/14  Celebrate B12: 2 ea;  Lot # [id number]; Exp: 07/14   Intervention:  Nutrition education/encouragement.  Monitoring/Evaluation:  Dietary intake, exercise, lap band fills, and body weight. Follow up in 3 mos.

## 2012-04-09 NOTE — Patient Instructions (Addendum)
Goals:  Follow Phase 4: High Protein, Low-Carbohydrate Diet  Increase calories to 800 -1000/day Add protein shake as a snack before work or during  Try making a smoothie to add calories:  1 scoop protein  1 - 6oz yogurt  1/4 - 1/2 cup almond milk  1 small piece fruit  *1-2 tbsp peanut butter  Ice  Eat 3-6 small meals/snacks, every 3-5 hrs  Increase lean protein foods to meet 80g goal  Aim for >30 min of physical activity daily Resume (1) multivitamin and (3) doses of calcium citrate daily. May try the B12 (350-500 mcg/day).

## 2012-05-20 ENCOUNTER — Other Ambulatory Visit: Payer: Self-pay

## 2012-05-21 ENCOUNTER — Other Ambulatory Visit: Payer: Self-pay

## 2012-06-14 ENCOUNTER — Ambulatory Visit (INDEPENDENT_AMBULATORY_CARE_PROVIDER_SITE_OTHER): Payer: Self-pay | Admitting: Internal Medicine

## 2012-06-14 ENCOUNTER — Encounter: Payer: Self-pay | Admitting: Internal Medicine

## 2012-06-14 VITALS — BP 124/82 | Temp 98.0°F | Wt 202.0 lb

## 2012-06-14 DIAGNOSIS — N390 Urinary tract infection, site not specified: Secondary | ICD-10-CM

## 2012-06-14 LAB — POCT URINALYSIS DIPSTICK
Nitrite, UA: POSITIVE
Urobilinogen, UA: 1
pH, UA: 5.5

## 2012-06-14 MED ORDER — CIPROFLOXACIN HCL 500 MG PO TABS: 500.0000 mg | ORAL_TABLET | Freq: Two times a day (BID) | ORAL | Status: AC

## 2012-06-14 NOTE — Progress Notes (Signed)
  Patient ID: Sharon Fowler, female   DOB: 02-Apr-1971, 41 y.o.   MRN: [id number] Back Pain and dysuria with chills for 3 days. No documented fever but did have one episode of severe sweating  Past Medical History  Diagnosis Date  . GOITER, MULTINODULAR 07/15/2009  . HYPERLIPIDEMIA 03/25/2008  . HYPERTENSION 03/25/2008  . OBESITY 03/25/2008  . ANEMIA, IRON DEFICIENCY UNSPEC 06/11/2009    corrected with iron replacement---menstrual reelated    History   Social History  . Marital Status: Divorced    Spouse Name: N/A    Number of Children: N/A  . Years of Education: N/A   Occupational History  . Not on file.   Social History Main Topics  . Smoking status: Former Smoker    Quit date: 01/02/1991  . Smokeless tobacco: Not on file  . Alcohol Use: No  . Drug Use: No  . Sexually Active: Not on file   Other Topics Concern  . Not on file   Social History Narrative  . No narrative on file    Past Surgical History  Procedure Date  . Laparoscopic gastric banding   . Cholecystectomy 10/91    laparascopy  . Tubal ligation     01/2003  . Cesarean section 7/91, 5/95    x2    Family History  Problem Relation Age of Onset  . Diabetes Mother   . Stroke Mother   . Alcohol abuse Father     Allergies  Allergen Reactions  . Chlorthalidone     REACTION: hypokalemia (2.8)    Current Outpatient Prescriptions on File Prior to Visit  Medication Sig Dispense Refill  . amLODipine (NORVASC) 5 MG tablet Take 1 tablet (5 mg total) by mouth daily.  90 tablet  3  . atorvastatin (LIPITOR) 10 MG tablet Take 1 tablet (10 mg total) by mouth daily. Or as directed  90 tablet  3  . lisinopril (PRINIVIL,ZESTRIL) 40 MG tablet Take 1 tablet (40 mg total) by mouth daily.  90 tablet  3  . polyethylene glycol (MIRALAX) powder Take 17 g by mouth daily.        Marland Kitchen DISCONTD: amLODipine (NORVASC) 5 MG tablet Take 1 tablet (5 mg total) by mouth daily.  30 tablet  11     patient denies chest pain, shortness  of breath, orthopnea. Denies lower extremity edema, abdominal pain, change in appetite, change in bowel movements. Patient denies rashes, musculoskeletal complaints. No other specific complaints in a complete review of systems.   BP 124/82  Temp 98 F (36.7 C) (Oral)  Wt 202 lb (91.627 kg) Well-developed well-nourished female in no acute distress. HEENT exam atraumatic, normocephalic, extraocular muscles are intact. Neck is supple. No jugular venous distention no thyromegaly. Chest clear to auscultation without increased work of breathing. Cardiac exam S1 and S2 are regular. Abdominal exam active bowel sounds, soft, nontender. No cva tenderness  UTI-- abnormal UA- Cipro Call if sxs do not dramatically improve quickly

## 2012-06-17 ENCOUNTER — Ambulatory Visit: Payer: Self-pay | Admitting: Internal Medicine

## 2012-06-17 ENCOUNTER — Telehealth: Payer: Self-pay | Admitting: Internal Medicine

## 2012-06-17 NOTE — Telephone Encounter (Signed)
Caller: Promise/Patient; Patient Name: Sharon Fowler; PCP: Birdie Sons; Best Callback Phone Number: [phone].  Pt seen in the office 8/16 for low back pain, kidney stones and diagnosed with UTI and placed on Cipro.  Pt is still having the low back pain, more on left side. All emergent symptoms of Urinary Symptoms - Female ruled out with exception to 'Evaluated by provider and symptoms worsening after following recommended treatment plan for 72 hours'.  Home care advice given and appt scheduled for 10:15 with Dr Sherlyn Lick

## 2012-07-11 ENCOUNTER — Ambulatory Visit: Payer: Self-pay | Admitting: *Deleted

## 2012-07-15 ENCOUNTER — Ambulatory Visit: Payer: Self-pay | Admitting: *Deleted

## 2012-07-15 ENCOUNTER — Encounter: Payer: 59 | Attending: General Surgery | Admitting: *Deleted

## 2012-07-15 ENCOUNTER — Encounter: Payer: Self-pay | Admitting: *Deleted

## 2012-07-15 VITALS — Ht 64.0 in | Wt 197.5 lb

## 2012-07-15 DIAGNOSIS — Z713 Dietary counseling and surveillance: Secondary | ICD-10-CM | POA: Insufficient documentation

## 2012-07-15 DIAGNOSIS — Z9884 Bariatric surgery status: Secondary | ICD-10-CM | POA: Insufficient documentation

## 2012-07-15 DIAGNOSIS — Z09 Encounter for follow-up examination after completed treatment for conditions other than malignant neoplasm: Secondary | ICD-10-CM | POA: Insufficient documentation

## 2012-07-15 DIAGNOSIS — E669 Obesity, unspecified: Secondary | ICD-10-CM

## 2012-07-15 NOTE — Progress Notes (Signed)
  Follow-up visit: 18 Months Post-Operative LAGB Surgery  Medical Nutrition Therapy:  Appt start time: 1100 end time:  1130.  Assessment:  Primary concerns today: post-operative bariatric surgery nutrition management.  Chronic belching and gas continues - tolerable now; worse on very empty stomach. Barium swallow was WNL. Has started having a protein shake or other half of subway sub for snack at work. Scared to get a fill as she is afraid she will start vomiting again.  Reports she has not exercised in last 2-3 months and is going through an emotional time in her personal life. Discussed setting an alarm to remember meds/supplements and resuming exercising to help her wt loss and emotional state.  Surgery date: 10/04/10 Start weight at [facility]: 295.6 lbs  Weight today: 197.5 lbs Weight change: - 6.5 lbs Total weight lost: 99.1 lbs total  BMI: 33.9 kg/m^2  Weight goal: 170 lbs (pt reports increased goal from 150 lbs) % Weight goal met: 79%  TANITA  BODY COMP RESULTS  04/09/12  07/15/12   %Fat 46.3% 47.5%   Fat Mass (lbs) 94.5 94.0   Fat Free Mass (lbs) 109.5 103.5   Total Body Water (lbs) 80.0 76.0   24-hr recall: B (7 AM): oatmeal (instant) w/ 1/2 scoop protein powder (10g)  Snk (AM): NONE (sleeping) L (PM): NONE (sleeping) Snk (PM): SKIPS ("I'm usually rushing to get to work") D (11 PM):  6" Subway flatbread w/ [place] and cheese, lettuce, spinach, onions, pickles, and ranch dressing Snk (3-4 AM): Pro shake (20g scoop) w/ peanut butter, 4 oz ice cream (10-12 oz) OR other 6" left from [place]  Fluid intake: sugar-free mix-ins, water, protein shake = > 64 oz Estimated total protein intake: 55-60g  Medications: No changes Supplementation: Taking inconsistently -  takes meds and supplements "when I remember"  Using straws: Only at restaurants - will not drink directly out of their cups Drinking while eating: No Hair loss: No Carbonated beverages: No (rare) N/V/D/C: Pt reports chronic  belching and gas when eating anything  Last Lap-Band fill: No recent band fill; fearful of vomiting again if she gets a fill   Recent physical activity:  None recently;  Usually walks 2x/day on weekends.   Progress Towards Goal(s):  In progress.   Nutritional Diagnosis:  Altered GI function related to LAGB as evidenced by pt with consistent and chronic belching and gas pain, believed to be related to the band.  Intervention:  Nutrition education/encouragement.  Monitoring/Evaluation:  Dietary intake, exercise, lap band fills, and body weight. Follow up in 6 mos per patient request.

## 2012-07-15 NOTE — Patient Instructions (Signed)
Goals:  Follow Phase 4: High Protein, Low-Carbohydrate Diet  Increase calories to 800 -1000/day Eat 3-6 small meals/snacks, every 3-5 hrs  Increase lean protein foods to meet 80g goal  Aim for >30 min of physical activity daily Resume (1) multivitamin and (3) doses of calcium citrate daily. May try the B12 (350-500 mcg/day).

## 2012-09-05 ENCOUNTER — Ambulatory Visit (INDEPENDENT_AMBULATORY_CARE_PROVIDER_SITE_OTHER): Payer: Commercial Managed Care - PPO | Admitting: Physician Assistant

## 2012-09-05 ENCOUNTER — Encounter (INDEPENDENT_AMBULATORY_CARE_PROVIDER_SITE_OTHER): Payer: Self-pay

## 2012-09-05 VITALS — BP 126/84 | HR 82 | Ht 64.0 in | Wt 220.8 lb

## 2012-09-05 DIAGNOSIS — Z4651 Encounter for fitting and adjustment of gastric lap band: Secondary | ICD-10-CM

## 2012-09-05 NOTE — Patient Instructions (Signed)
Take clear liquids tonight. Thin protein shakes are ok to start tomorrow morning. Slowly advance your diet thereafter. Call us if you have persistent vomiting or regurgitation, night cough or reflux symptoms. Return as scheduled or sooner if you notice no changes in hunger/portion sizes.  

## 2012-09-05 NOTE — Progress Notes (Signed)
  HISTORY: Sharon Fowler is a 41 y.o.female who received an AP-Standard lap-band in December 2011 by Dr. Johna Sheriff. She comes in with 10 lbs weight gain, increasing hunger and portion sizes. She denies regurgitation or reflux. She would like an adjustment today.  VITAL SIGNS: Filed Vitals:   09/05/12 1109  BP: 126/84  Pulse: 82    PHYSICAL EXAM: Physical exam reveals a very well-appearing 41 y.o.female in no apparent distress Neurologic: Awake, alert, oriented Psych: Bright affect, conversant Respiratory: Breathing even and unlabored. No stridor or wheezing Abdomen: Soft, nontender, nondistended to palpation. Incisions well-healed. No incisional hernias. Port easily palpated. Extremities: Atraumatic, good range of motion.  ASSESMENT: 41 y.o.  female  s/p AP-Standard lap-band.   PLAN: The patient's port was accessed with a 20G Huber needle without difficulty. Clear fluid was aspirated and 0.5 mL saline was added to the port to give a total predicted volume of 5.25 mL. The patient was able to swallow water without difficulty following the procedure and was instructed to take clear liquids for the next 24-48 hours and advance slowly as tolerated.

## 2012-10-31 ENCOUNTER — Encounter (INDEPENDENT_AMBULATORY_CARE_PROVIDER_SITE_OTHER): Payer: Self-pay

## 2012-10-31 ENCOUNTER — Ambulatory Visit (INDEPENDENT_AMBULATORY_CARE_PROVIDER_SITE_OTHER): Payer: Commercial Managed Care - PPO | Admitting: Physician Assistant

## 2012-10-31 VITALS — BP 138/102 | HR 81 | Temp 98.2°F | Ht 64.0 in | Wt 226.8 lb

## 2012-10-31 DIAGNOSIS — Z4651 Encounter for fitting and adjustment of gastric lap band: Secondary | ICD-10-CM

## 2012-10-31 NOTE — Progress Notes (Signed)
  HISTORY: Sharon Fowler is a 42 y.o.female who received an AP-Standard lap-band in December 2011 by Dr. Johna Sheriff. She comes in complaining of persistent hunger, increased portion sizes and increased weight (she's gained six pounds since November). She denies regurgitation or reflux. She wants a fill today. She says that she really didn't have any restriction since her last visit.  VITAL SIGNS: Filed Vitals:   10/31/12 1044  BP: 138/102  Pulse: 81  Temp: 98.2 F (36.8 C)    PHYSICAL EXAM: Physical exam reveals a very well-appearing 42 y.o.female in no apparent distress Neurologic: Awake, alert, oriented Psych: Bright affect, conversant Respiratory: Breathing even and unlabored. No stridor or wheezing Abdomen: Soft, nontender, nondistended to palpation. Incisions well-healed. No incisional hernias. Port easily palpated. Extremities: Atraumatic, good range of motion.  ASSESMENT: 42 y.o.  female  s/p AP-Standard lap-band.   PLAN: The patient's port was accessed with a 20G Huber needle without difficulty. Clear fluid was aspirated and 0.5 mL saline was added to the port to give a total predicted volume of 5.75 mL. The patient was able to swallow water without difficulty following the procedure and was instructed to take clear liquids for the next 24-48 hours and advance slowly as tolerated. Her diastolic pressure today is 102. She's asymptomatic. I requested she contact her primary care physician for close follow-up. She agreed.

## 2012-10-31 NOTE — Patient Instructions (Signed)
Take clear liquids tonight. Thin protein shakes are ok to start tomorrow morning. Slowly advance your diet thereafter. Call us if you have persistent vomiting or regurgitation, night cough or reflux symptoms. Return as scheduled or sooner if you notice no changes in hunger/portion sizes.  

## 2013-01-14 ENCOUNTER — Ambulatory Visit: Payer: 59 | Admitting: *Deleted

## 2013-01-29 ENCOUNTER — Ambulatory Visit: Payer: 59 | Admitting: *Deleted

## 2013-01-30 ENCOUNTER — Encounter (INDEPENDENT_AMBULATORY_CARE_PROVIDER_SITE_OTHER): Payer: Self-pay | Admitting: Physician Assistant

## 2013-01-30 ENCOUNTER — Encounter: Payer: Self-pay | Admitting: *Deleted

## 2013-01-30 ENCOUNTER — Encounter: Payer: 59 | Attending: General Surgery | Admitting: *Deleted

## 2013-01-30 ENCOUNTER — Ambulatory Visit (INDEPENDENT_AMBULATORY_CARE_PROVIDER_SITE_OTHER): Payer: Commercial Managed Care - PPO | Admitting: Physician Assistant

## 2013-01-30 VITALS — BP 136/76 | HR 73 | Temp 98.2°F | Resp 18 | Ht 64.0 in | Wt 210.0 lb

## 2013-01-30 VITALS — Ht 64.0 in | Wt 210.5 lb

## 2013-01-30 DIAGNOSIS — E669 Obesity, unspecified: Secondary | ICD-10-CM | POA: Insufficient documentation

## 2013-01-30 DIAGNOSIS — Z4651 Encounter for fitting and adjustment of gastric lap band: Secondary | ICD-10-CM

## 2013-01-30 DIAGNOSIS — Z09 Encounter for follow-up examination after completed treatment for conditions other than malignant neoplasm: Secondary | ICD-10-CM | POA: Insufficient documentation

## 2013-01-30 DIAGNOSIS — Z713 Dietary counseling and surveillance: Secondary | ICD-10-CM | POA: Insufficient documentation

## 2013-01-30 DIAGNOSIS — Z9884 Bariatric surgery status: Secondary | ICD-10-CM | POA: Insufficient documentation

## 2013-01-30 NOTE — Progress Notes (Signed)
  HISTORY: Sharon Fowler is a 42 y.o.female who received an AP-Standard lap-band in December 2011 by Dr. Johna Sheriff. She comes in with 16 lbs weight loss since her last visit in January. She met with [name] this morning. She has drastically reduced carbohydrate intake and she believes this is the proximate reason for her good weight loss since her last visit. She does have hunger a couple of hours after a meal and she's capable of eating larger than desired portions at one sitting. She is having no regurgitation or reflux symptoms. She would like a fill today.  VITAL SIGNS: Filed Vitals:   01/30/13 0902  BP: 136/76  Pulse: 73  Temp: 98.2 F (36.8 C)  Resp: 18    PHYSICAL EXAM: Physical exam reveals a very well-appearing 42 y.o.female in no apparent distress Neurologic: Awake, alert, oriented Psych: Bright affect, conversant Respiratory: Breathing even and unlabored. No stridor or wheezing Abdomen: Soft, nontender, nondistended to palpation. Incisions well-healed. No incisional hernias. Port easily palpated. Extremities: Atraumatic, good range of motion.  ASSESMENT: 42 y.o.  female  s/p AP-Standard lap-band.   PLAN: The patient's port was accessed with a 20G Huber needle without difficulty. Clear fluid was aspirated and 0.25 mL saline was added to the port to give a total predicted volume of 6 mL. The patient was able to swallow water without difficulty following the procedure and was instructed to take clear liquids for the next 24-48 hours and advance slowly as tolerated.

## 2013-01-30 NOTE — Progress Notes (Signed)
  Follow-up visit:   2.5 Years Post-Operative LAGB Surgery  Medical Nutrition Therapy:  Appt start time: 800   End time:  830.  Primary concerns today:  Post-operative bariatric surgery nutrition management.  Sharon Fowler returns with a 13 lb wt gain since last visit. Reports she is down from 226 lbs in Jan '14. States she was eating just about everything (cake, ice cream, cookies, pies, potato chips) before she stopped on New Year's.  Excessive CHO intake continues in the form of hot chocolate, chicken fingers, etc. Is also drinking with meals.  Chronic belching and gas continues, though still tolerable. Feels she needs another fill. Is in the yellow zone.    Surgery date: 10/04/10 [facility] weight at [facility]: 295.6 lbs  Weight today: 210.5 lbs Weight change: 13.0 lb GAIN Total weight lost: 85.1 lbs total  BMI: 36.1 kg/m^2  Weight goal: 170 lbs (pt reports increased goal from 150 lbs) % Weight goal met: %  TANITA  BODY COMP RESULTS  04/09/12  07/15/12 01/30/13   Fat Mass (lbs) 94.5 94.0 102.5   Fat Free Mass (lbs) 109.5 103.5 108.0   Total Body Water (lbs) 80.0 76.0 79.0   24-hr recall: B (7 AM): Oatmeal (instant) w/ 1 scoop protein powder (20g)  Snk (AM): NONE (sleeping) L (PM): NONE (sleeping) Snk (PM): SKIPS ("I'm usually rushing to get to work") D (11 PM):  Balance bar (15g) OR Soup or [employer] meal (10g) Snk (3-4 AM): Balance bar (15g) Snk (6 AM): Chicken tenders (4 strips) or 3 oz fish  Fluid intake: sugar-free mix-ins, water =  50-60 oz Estimated total protein intake: 50g  Medications: No changes Supplementation:  Not taking "I'm not good at taking pills"  Using straws: Only at restaurants - will not drink directly out of their cups Drinking while eating:  Yes Hair loss: No Carbonated beverages: No (rare) N/V/D/C: Pt reports chronic belching and gas when eating anything  Last Lap-Band fill: 10/31/12 @ 0.5 cc - now @ 5.75 cc; reports she is in the yellow  zone.  Recent physical activity:  None.   Progress Towards Goal(s):  In progress.   Nutritional Diagnosis:  Altered GI function related to LAGB as evidenced by pt with consistent and chronic belching and gas pain, believed to be related to the band.  Intervention:  Nutrition education/encouragement.  Monitoring/Evaluation:  Dietary intake, exercise, lap band fills, and body weight. Follow up in 6 mos per patient request.

## 2013-01-30 NOTE — Patient Instructions (Signed)
Take clear liquids tonight. Thin protein shakes are ok to start tomorrow morning. Slowly advance your diet thereafter. Call us if you have persistent vomiting or regurgitation, night cough or reflux symptoms. Return as scheduled or sooner if you notice no changes in hunger/portion sizes.  

## 2013-01-30 NOTE — Patient Instructions (Addendum)
Goals:  Follow Phase 3B:  High Protein + Non-Starchy Vegetables Eat 3-6 small meals/snacks, every 3-5 hrs  No drinking 15 min before meals, any during (except baby sips), and for 30 min after meals/snacks. Increase lean protein foods to meet 60-80g goal  Try [place] yogurt (Yoplait or [employer] or [employer]) on way to work Aim for >30 min of physical activity daily Resume (1) multivitamin and (3) doses of calcium citrate daily. May try the B12 (350-500 mcg/day).  Limit carbohydrate intake to 15 grams per meal/snack Try Swiss Miss Sensible Sweets - No Sugar Added

## 2013-05-01 ENCOUNTER — Ambulatory Visit (INDEPENDENT_AMBULATORY_CARE_PROVIDER_SITE_OTHER): Payer: Commercial Managed Care - PPO | Admitting: Physician Assistant

## 2013-05-01 ENCOUNTER — Encounter (INDEPENDENT_AMBULATORY_CARE_PROVIDER_SITE_OTHER): Payer: Self-pay

## 2013-05-01 DIAGNOSIS — Z4651 Encounter for fitting and adjustment of gastric lap band: Secondary | ICD-10-CM

## 2013-05-01 NOTE — Patient Instructions (Signed)

## 2013-05-01 NOTE — Progress Notes (Signed)
  HISTORY: Sharon Fowler is a 42 y.o.female who received an AP-Standard lap-band in December 2011 by Dr. Johna Sheriff. She comes in with stable weight since her last visit three months ago. She said she has felt no change in restriction since her last adjustment. She denies regurgitation or reflux symptoms but she does complain about hunger and larger than desired portion sizes. She wants a fill today.  VITAL SIGNS: Filed Vitals:   05/01/13 0906  BP: 110/66  Pulse: 70  Temp: 98.2 F (36.8 C)  Resp: 14    PHYSICAL EXAM: Physical exam reveals a very well-appearing 42 y.o.female in no apparent distress Neurologic: Awake, alert, oriented Psych: Bright affect, conversant Respiratory: Breathing even and unlabored. No stridor or wheezing Abdomen: Soft, nontender, nondistended to palpation. Incisions well-healed. No incisional hernias. Port easily palpated. Extremities: Atraumatic, good range of motion.  ASSESMENT: 42 y.o.  female  s/p AP-Standard lap-band.   PLAN: The patient's port was accessed with a 20G Huber needle without difficulty. Clear fluid was aspirated and 0.5 mL saline was added to the port to give a total predicted volume of 6.5 mL. The patient was able to swallow water without difficulty following the procedure and was instructed to take clear liquids for the next 24-48 hours and advance slowly as tolerated.

## 2013-06-05 ENCOUNTER — Encounter (INDEPENDENT_AMBULATORY_CARE_PROVIDER_SITE_OTHER): Payer: Self-pay

## 2013-06-05 ENCOUNTER — Ambulatory Visit (INDEPENDENT_AMBULATORY_CARE_PROVIDER_SITE_OTHER): Payer: Commercial Managed Care - PPO | Admitting: Physician Assistant

## 2013-06-05 DIAGNOSIS — Z9884 Bariatric surgery status: Secondary | ICD-10-CM

## 2013-06-05 NOTE — Patient Instructions (Signed)
Continue to observe the area for increased tenderness or redness. If symptoms worsen, especially if you begin having fever or difficulty with swallowing, call our office at once and ask to speak with Regan or Neysa Bonito (nurses). If you still have pain at the port site that isn't improving next week, call for an appointment. If symptoms improve, follow-up with [place] at your next scheduled appointment.

## 2013-06-05 NOTE — Progress Notes (Signed)
  HISTORY: Sharon Fowler is a 42 y.o.female who received an AP-Standard lap-band in December 2011 by Dr. Johna Sheriff. She comes in with 4 lbs weight loss since her last visit one month ago. She has no complaints of hunger or increasing portion sizes. No regurgitation or reflux symptoms. She does complain of two days of port tenderness. She recalls no injury or inciting events. She was wearing a corset until one week ago. She had no pain complaints while wearing the garment. She denies swelling or redness to the port site.  VITAL SIGNS: Filed Vitals:   06/05/13 0930  BP: 132/88  Pulse: 64  Temp: 98.7 F (37.1 C)  Resp: 16    PHYSICAL EXAM: Physical exam reveals a very well-appearing 42 y.o.female in no apparent distress Neurologic: Awake, alert, oriented Psych: Bright affect, conversant Respiratory: Breathing even and unlabored. No stridor or wheezing Extremities: Atraumatic, good range of motion. Skin: Warm, Dry. Mild papular rash to her lower back, dry in character. No bullae or vesicles. Musculoskeletal: Normal gait, Joints normal Abdomen: Soft, nondistended. Incisions intact. Tenderness to palpation of the port itself. No evidence of hernia. No erythema or edema. No rebound.  ASSESMENT: 42 y.o.  female  s/p AP-Standard lap-band.   PLAN: The patient was examined with Dr. Johna Sheriff. There is no clear evidence to suggest erosion or hernia. Dr. Johna Sheriff recommended close surveillance over the next week, with specific instructions to call us right away if there is increasing pain, spreading redness or swelling. If there is persistence of pain, she should call as well. Otherwise we'll have her follow-up in two months if the pain improves. She voiced understanding and agreement.

## 2013-07-11 LAB — HM MAMMOGRAPHY: HM Mammogram: NORMAL

## 2013-07-24 ENCOUNTER — Encounter (INDEPENDENT_AMBULATORY_CARE_PROVIDER_SITE_OTHER): Payer: Commercial Managed Care - PPO

## 2013-08-12 ENCOUNTER — Encounter: Payer: Self-pay | Admitting: *Deleted

## 2013-08-12 ENCOUNTER — Encounter: Payer: 59 | Attending: General Surgery | Admitting: *Deleted

## 2013-08-12 VITALS — Ht 64.0 in | Wt 198.5 lb

## 2013-08-12 DIAGNOSIS — Z713 Dietary counseling and surveillance: Secondary | ICD-10-CM | POA: Insufficient documentation

## 2013-08-12 DIAGNOSIS — E669 Obesity, unspecified: Secondary | ICD-10-CM | POA: Insufficient documentation

## 2013-08-12 DIAGNOSIS — Z9884 Bariatric surgery status: Secondary | ICD-10-CM | POA: Insufficient documentation

## 2013-08-12 NOTE — Progress Notes (Addendum)
  Follow-up visit:   ~ 3 Years Post-Operative LAGB Surgery  Medical Nutrition Therapy:  Appt start time:  800   End time:  830.  Primary concerns today:  Post-operative bariatric surgery nutrition management.  Rogelio returns with a 12 lb wt loss since last visit (9.5 lbs of FAT MASS).  Reports recent tightening of braces, which have caused decreased PO intake. Chronic belching and gas continues, though still tolerable. Feels she is in the green zone.  Reports A1c of 6.1% (07/11/13) @ OB-GYN. Having rechecked in Dec by Dr. Cato Mulligan.  Port pain from last visit with [name] (06/05/13) resolving.   Surgery date: 10/04/10 Start weight at [facility]: 295.6 lbs  Weight today: 198.5 lbs Weight change: 12.0 lb  Total weight lost: 97.1 lbs total  BMI: 34.1 kg/m^2  Weight goal: 170 lbs (pt reports increased goal from 150 lbs) % Weight goal met: 77%  TANITA  BODY COMP RESULTS  04/09/12  07/15/12 01/30/13 08/12/13   Fat Mass (lbs) 94.5 94.0 102.5 93.0   Fat Free Mass (lbs) 109.5 103.5 108.0 105.5   Total Body Water (lbs) 80.0 76.0 79.0 77.0   24-hr recall: Still does chicken tenders (2 @ a time), fish. Had stopped potato chips and now consumes veggie straws. Eats 16 oz jar of peanut butter in one week. Discussed switching to PB2. Gave sample.   B (7 AM): NONE (just off work)  Snk (AM): NONE (sleeping) L (PM): NONE (sleeping) D (8-9 PM): 2 pkts oatmeal w/ protein powder (20g) Snk(11 PM):  PB2 thins (2 oz) Snk (3-4 AM): Smoothie King smoothie w/ protein [facility]) w/ pineapple, mango, extra protein, turbine (sugar) - 32 oz over work shift (6p-6a)  Fluid intake: sugar-free mix-ins, water =  50-60 oz Estimated total protein intake: 50g  Medications: No changes Supplementation:  Not taking "I'm not good at taking pills". Refuses to set up schedule to remember  Using straws: Only at restaurants - will not drink directly out of their cups Drinking while eating:  Yes Hair loss: No Carbonated beverages: No  (rare) N/V/D/C:  Chronic belching and gas continues when eating anything. No sliming, etc reported; few episodes of regurgitation but not problematic   Last Lap-Band fill: 05/01/13 @ 0.5 cc - now @ 6.5 cc total; reports she is in the green zone. Port pain is resolving.   Recent physical activity:  Walks 3 miles/day with group at work, 4 days/week.   Progress Towards Goal(s):  In progress.   Nutritional Diagnosis:  [place]-1.4 Altered GI function r/t LAGB as evidenced by pt with consistent and chronic belching and gas pain, believed to be related to the band.  [place]-1.2 Biting/chewing difficulty r/t tightening of braces as evidenced by patient report of decreased PO intake due to mouth/tooth pain. - NEW  Samples given during visit include:   PB2: 1 pkt Lot: NONE; Exp: 11/11/13  Intervention:  Nutrition education/reinforcement.  Monitoring/Evaluation:  Dietary intake, exercise, lap band fills, and body weight. Follow up in 6 mos per patient request or sooner if next A1c still elevated.

## 2013-08-12 NOTE — Patient Instructions (Addendum)
Goals:  Follow Phase 3B:  High Protein + Non-Starchy Vegetables Eat 3-6 small meals/snacks, every 3-5 hrs  Return visit if A1c not decreased when rechecked in Dec 2014 No drinking 15 min before meals, any during, and for 30 min after meals/snacks. Increase lean protein foods to meet 60-80g goal  Continue greek yogurt Aim for >30 min of physical activity daily Resume (1) multivitamin and (3) doses of calcium citrate daily. May try the B12 (350-500 mcg/day).  Limit carbohydrate intake to 15 grams per meal/snack Try Swiss Miss Sensible Sweets - No Sugar Added and add chocolate protein powder Change to PB2 powdered peanut butter ([employer], [employer] - in peanut butter section)

## 2013-09-04 ENCOUNTER — Other Ambulatory Visit: Payer: Self-pay

## 2013-10-15 ENCOUNTER — Other Ambulatory Visit (INDEPENDENT_AMBULATORY_CARE_PROVIDER_SITE_OTHER): Payer: 59

## 2013-10-15 DIAGNOSIS — Z833 Family history of diabetes mellitus: Secondary | ICD-10-CM

## 2013-10-15 DIAGNOSIS — Z0000 Encounter for general adult medical examination without abnormal findings: Secondary | ICD-10-CM

## 2013-10-15 LAB — CBC WITH DIFFERENTIAL/PLATELET
Basophils Absolute: 0 10*3/uL (ref 0.0–0.1)
Eosinophils Absolute: 0.1 10*3/uL (ref 0.0–0.7)
Hemoglobin: 13.3 g/dL (ref 12.0–15.0)
Lymphocytes Relative: 31.8 % (ref 12.0–46.0)
Lymphs Abs: 2.4 10*3/uL (ref 0.7–4.0)
MCHC: 33.3 g/dL (ref 30.0–36.0)
MCV: 82.3 fl (ref 78.0–100.0)
Monocytes Absolute: 0.4 10*3/uL (ref 0.1–1.0)
Monocytes Relative: 4.9 % (ref 3.0–12.0)
Neutro Abs: 4.6 10*3/uL (ref 1.4–7.7)
Neutrophils Relative %: 61.2 % (ref 43.0–77.0)
RDW: 14.3 % (ref 11.5–14.6)

## 2013-10-15 LAB — LIPID PANEL
Cholesterol: 248 mg/dL — ABNORMAL HIGH (ref 0–200)
Triglycerides: 72 mg/dL (ref 0.0–149.0)

## 2013-10-15 LAB — POCT URINALYSIS DIPSTICK
Bilirubin, UA: NEGATIVE
Blood, UA: NEGATIVE
Glucose, UA: NEGATIVE
Spec Grav, UA: >=1.03
pH, UA: 6

## 2013-10-15 LAB — HEPATIC FUNCTION PANEL
ALT: 9 U/L (ref 0–35)
AST: 15 U/L (ref 0–37)
Alkaline Phosphatase: 78 U/L (ref 39–117)
Bilirubin, Direct: 0 mg/dL (ref 0.0–0.3)
Total Bilirubin: 0.4 mg/dL (ref 0.3–1.2)
Total Protein: 7.6 g/dL (ref 6.0–8.3)

## 2013-10-15 LAB — BASIC METABOLIC PANEL
BUN: 10 mg/dL (ref 6–23)
CO2: 28 mEq/L (ref 19–32)
Calcium: 9.5 mg/dL (ref 8.4–10.5)
Chloride: 104 mEq/L (ref 96–112)
Creatinine, Ser: 0.6 mg/dL (ref 0.4–1.2)
GFR: 150.31 mL/min (ref >60.00)
Glucose, Bld: 75 mg/dL (ref 70–99)

## 2013-10-16 ENCOUNTER — Ambulatory Visit (INDEPENDENT_AMBULATORY_CARE_PROVIDER_SITE_OTHER): Payer: Commercial Managed Care - PPO | Admitting: Physician Assistant

## 2013-10-16 ENCOUNTER — Encounter (INDEPENDENT_AMBULATORY_CARE_PROVIDER_SITE_OTHER): Payer: Self-pay

## 2013-10-16 VITALS — BP 134/76 | HR 89 | Temp 98.0°F | Resp 18 | Ht 64.0 in | Wt 194.8 lb

## 2013-10-16 DIAGNOSIS — Z9884 Bariatric surgery status: Secondary | ICD-10-CM

## 2013-10-16 NOTE — Patient Instructions (Signed)
Return in six months. Focus on good food choices as well as physical activity. Return sooner if you have an increase in hunger, portion sizes or weight. Return also for difficulty swallowing, night cough, reflux.   

## 2013-10-16 NOTE — Progress Notes (Signed)
  HISTORY: Sharon Fowler is a 42 y.o.female who received an AP-Standard lap-band in December 2011 by Dr. Johna Sheriff. She comes in with 12 lbs weight loss since her last visit in August. She's very pleased with her progress overall. She denies regurgitation, reflux or night cough. Her port tenderness issue that she mentioned last visit has completely resolved. She thinks she's in the green zone at the present.  VITAL SIGNS: Filed Vitals:   10/16/13 0926  BP: 134/76  Pulse: 89  Temp: 98 F (36.7 C)  Resp: 18    PHYSICAL EXAM: Physical exam reveals a very well-appearing 42 y.o.female in no apparent distress Neurologic: Awake, alert, oriented Psych: Bright affect, conversant Respiratory: Breathing even and unlabored. No stridor or wheezing Extremities: Atraumatic, good range of motion. Skin: Warm, Dry, no rashes Musculoskeletal: Normal gait, Joints normal  ASSESMENT: 41 y.o.  female  s/p AP-Standard lap-band.   PLAN: As her hunger is well controlled, her portion sizes are small and she is continuing to lose weight, we deferred a fill today. We'll have her back in six months unless she notices changes that would prompt an earlier visit.

## 2013-10-22 ENCOUNTER — Encounter: Payer: 59 | Admitting: Internal Medicine

## 2013-10-22 NOTE — Progress Notes (Signed)
No show  This encounter was created in error - please disregard.

## 2013-10-29 ENCOUNTER — Encounter: Payer: 59 | Admitting: Internal Medicine

## 2013-11-07 ENCOUNTER — Encounter: Payer: Self-pay | Admitting: Internal Medicine

## 2013-11-07 ENCOUNTER — Ambulatory Visit (INDEPENDENT_AMBULATORY_CARE_PROVIDER_SITE_OTHER): Payer: 59 | Admitting: Internal Medicine

## 2013-11-07 VITALS — BP 164/100 | HR 80 | Temp 97.8°F | Ht 63.5 in | Wt 199.0 lb

## 2013-11-07 DIAGNOSIS — I10 Essential (primary) hypertension: Secondary | ICD-10-CM

## 2013-11-07 DIAGNOSIS — E785 Hyperlipidemia, unspecified: Secondary | ICD-10-CM

## 2013-11-07 DIAGNOSIS — Z0000 Encounter for general adult medical examination without abnormal findings: Secondary | ICD-10-CM

## 2013-11-07 MED ORDER — ATORVASTATIN CALCIUM 10 MG PO TABS
10.0000 mg | ORAL_TABLET | Freq: Every day | ORAL | Status: DC
Start: 2013-11-07 — End: 2016-08-24

## 2013-11-07 MED ORDER — LISINOPRIL 40 MG PO TABS: 40.0000 mg | ORAL_TABLET | Freq: Every day | ORAL | Status: DC

## 2013-11-07 MED ORDER — AMLODIPINE BESYLATE 5 MG PO TABS: 5.0000 mg | ORAL_TABLET | Freq: Every day | ORAL | Status: DC

## 2013-11-07 NOTE — Progress Notes (Signed)
cpx  Past Medical History  Diagnosis Date  . GOITER, MULTINODULAR 07/15/2009  . HYPERLIPIDEMIA 03/25/2008  . HYPERTENSION 03/25/2008  . OBESITY 03/25/2008  . ANEMIA, IRON DEFICIENCY UNSPEC 06/11/2009    corrected with iron replacement---menstrual reelated    History   Social History  . Marital Status: Married    Spouse Name: N/A    Number of Children: N/A  . Years of Education: N/A   Occupational History  . Not on file.   Social History Main Topics  . Smoking status: Former Smoker    Quit date: 01/02/1991  . Smokeless tobacco: Not on file  . Alcohol Use: No  . Drug Use: No  . Sexual Activity: Not on file   Other Topics Concern  . Not on file   Social History Narrative  . No narrative on file    Past Surgical History  Procedure Laterality Date  . Laparoscopic gastric banding    . Cholecystectomy  10/91    laparascopy  . Tubal ligation      01/2003  . Cesarean section  7/91, 5/95    x2    Family History  Problem Relation Age of Onset  . Diabetes Mother   . Stroke Mother   . Alcohol abuse Father     Allergies  Allergen Reactions  . Chlorthalidone     REACTION: hypokalemia (2.8)    Current Outpatient Prescriptions on File Prior to Visit  Medication Sig Dispense Refill  . polyethylene glycol (MIRALAX) powder Take 17 g by mouth daily as needed.        No current facility-administered medications on file prior to visit.     patient denies chest pain, shortness of breath, orthopnea. Denies lower extremity edema, abdominal pain, change in appetite, change in bowel movements. Patient denies rashes, musculoskeletal complaints. No other specific complaints in a complete review of systems.   BP 164/100  Pulse 80  Temp(Src) 97.8 F (36.6 C) (Oral)  Ht 5' 3.5" (1.613 m)  Wt 199 lb (90.266 kg)  BMI 34.69 kg/m2  Well-developed well-nourished female in no acute distress. HEENT exam atraumatic, normocephalic, extraocular muscles are intact. Neck is supple. No  jugular venous distention no thyromegaly. Chest clear to auscultation without increased work of breathing. Cardiac exam S1 and S2 are regular. Abdominal exam active bowel sounds, soft, nontender. Extremities no edema. Neurologic exam she is alert without any motor sensory deficits. Gait is normal.  Well visit- health maint utd Resume lisinopril and atorvastatin Check labs and bp in 3 months

## 2014-02-06 ENCOUNTER — Ambulatory Visit (INDEPENDENT_AMBULATORY_CARE_PROVIDER_SITE_OTHER): Payer: 59 | Admitting: Internal Medicine

## 2014-02-06 VITALS — BP 134/92

## 2014-02-06 DIAGNOSIS — E785 Hyperlipidemia, unspecified: Secondary | ICD-10-CM

## 2014-02-06 DIAGNOSIS — I10 Essential (primary) hypertension: Secondary | ICD-10-CM

## 2014-02-06 DIAGNOSIS — Z23 Encounter for immunization: Secondary | ICD-10-CM

## 2014-02-06 LAB — LIPID PANEL
CHOLESTEROL: 208 mg/dL — AB (ref 0–200)
HDL: 54.4 mg/dL (ref >39.00)
LDL CALC: 140 mg/dL — AB (ref 0–99)
Total CHOL/HDL Ratio: 4
Triglycerides: 67 mg/dL (ref 0.0–149.0)
VLDL: 13.4 mg/dL (ref 0.0–40.0)

## 2014-02-06 LAB — HEPATIC FUNCTION PANEL
ALK PHOS: 71 U/L (ref 39–117)
ALT: 9 U/L (ref 0–35)
AST: 15 U/L (ref 0–37)
Albumin: 3.7 g/dL (ref 3.5–5.2)
BILIRUBIN DIRECT: 0 mg/dL (ref 0.0–0.3)
BILIRUBIN TOTAL: 0.3 mg/dL (ref 0.3–1.2)
Total Protein: 7.3 g/dL (ref 6.0–8.3)

## 2014-02-06 NOTE — Progress Notes (Signed)
Patient comes in today for a nurse visit BP check, tdap, and lipid and liver labs.  She is currently taking lisinopril 40 mg daily.  She does not check her BP's at home.  Instructed patient to continue current medicines for now and I would call her if Dr. Leanne Chang wanted to change anything.  Note forwarded to Dr. Leanne Chang for review

## 2014-02-09 ENCOUNTER — Telehealth: Payer: Self-pay | Admitting: Internal Medicine

## 2014-02-09 NOTE — Telephone Encounter (Signed)
Relevant patient education assigned to patient using Emmi. ° °

## 2014-02-16 ENCOUNTER — Encounter: Payer: 59 | Attending: Internal Medicine | Admitting: Dietician

## 2014-02-16 VITALS — Ht 64.0 in | Wt 194.0 lb

## 2014-02-16 DIAGNOSIS — Z9884 Bariatric surgery status: Secondary | ICD-10-CM | POA: Insufficient documentation

## 2014-02-16 DIAGNOSIS — Z713 Dietary counseling and surveillance: Secondary | ICD-10-CM | POA: Insufficient documentation

## 2014-02-16 DIAGNOSIS — E669 Obesity, unspecified: Secondary | ICD-10-CM | POA: Insufficient documentation

## 2014-02-16 DIAGNOSIS — Z9889 Other specified postprocedural states: Secondary | ICD-10-CM | POA: Insufficient documentation

## 2014-02-16 NOTE — Progress Notes (Addendum)
  Follow-up visit:   ~ 3.5 Years Post-Operative LAGB Surgery  Medical Nutrition Therapy:  Appt start time:  800   End time:  830.  Primary concerns today:  Post-operative bariatric surgery nutrition management.  Marciel returns with a 4.5 lb wt loss since last visit. Feels like she may need an adjustment and hopes to see Jonni Sanger in June. Is eating more protein, drinking more water. Hgb A1c was 5.5% in December.   Drinking juices, slushies (rarely), smoothies with straws. Not taking supplements as directed. Drinking while eating.   Surgery date: 10/04/10 Start weight at [facility]: 295.6 lbs  Weight today: 194.0 bs  Weight change: 4.5 lb  Total weight lost: 101.1 lbs total  BMI: 34.1 kg/m^2  Weight goal: 170 lbs (pt reports increased goal from 150 lbs) % Weight goal met: 80%  TANITA  BODY COMP RESULTS  04/09/12  07/15/12 01/30/13 08/12/13 02/16/14   Fat Mass (lbs) 94.5 94.0 102.5 93.0 87.5   Fat Free Mass (lbs) 109.5 103.5 108.0 105.5 106.5   Total Body Water (lbs) 80.0 76.0 79.0 77.0 78.0   24-hr recall:  Eats 16 oz jar of peanut butter in one week. Not interested in using PB2 since she need to leave it at work.   B (7 AM): egg with cheese and 3-4 pieces of [place] bacon (24 g) Snk (AM): NONE (sleeping) L (7 PM): 2 pieces of chicken (thighs) with string beans and roasted potatoes (28 g) D (8-9 PM): 2 pkts oatmeal (8 g) Snk(11 PM):  PB2 thins (2 oz) Snk (3-4 AM): Smoothie King smoothie w/ protein [facility] [employer]) w/ pineapple, mango, extra protein, turbine (sugar) - 32 oz over work shift (6p-6a)  Fluid intake: 12 oz juice, 48 oz water = 60 oz Estimated total protein intake: ~60g  Medications: No changes Supplementation:  Not taking "I'm not good at taking pills". Refuses to set up schedule to remember  Using straws: Only at restaurants - will not drink directly out of their cups Drinking while eating:  Yes Hair loss: No Carbonated beverages: No N/V/D/C:  No Last Lap-Band fill: 05/01/13 @  0.5 cc - now @ 6.5 cc total; reports she is in the green zone. Port pain is resolving.   Recent physical activity:  None   Progress Towards Goal(s):  In progress.   Nutritional Diagnosis:  [place]-1.4 Altered GI function r/t LAGB as evidenced by pt with consistent and chronic belching and gas pain, believed to be related to the band.-Resolved  [place]-1.2 Biting/chewing difficulty r/t tightening of braces as evidenced by patient report of decreased PO intake due to mouth/tooth pain. - Resolved  [place]-3.3 Overweight/obesity related to past poor dietary habits and physical inactivity as evidenced by patient w/ recent LAGB surgery following dietary guidelines for continued weight loss.  Intervention:  Nutrition education/reinforcement.    Emailed her the Phase 3B High Protein + Non Starchy Vegetable diet.  Monitoring/Evaluation:  Dietary intake, exercise, lap band fills, and body weight. Follow up in 6 mos per patient request.

## 2014-02-16 NOTE — Patient Instructions (Addendum)
Goals:  Follow Phase 3B:  High Protein + Non-Starchy Vegetables Eat 3-6 small meals/snacks, every 3-5 hrs  No drinking 15 min before meals, any during, and for 30 min after meals/snacks. Increase lean protein foods to meet 60-80g goal  Aim for >30 min of physical activity daily Resume (1) multivitamin and (3) doses of calcium citrate daily.  Limit carbohydrate intake to 15 grams per meal/snack Work on drinking mostly water

## 2014-02-26 ENCOUNTER — Ambulatory Visit (INDEPENDENT_AMBULATORY_CARE_PROVIDER_SITE_OTHER): Payer: 59 | Admitting: Physician Assistant

## 2014-02-26 ENCOUNTER — Encounter: Payer: Self-pay | Admitting: Internal Medicine

## 2014-02-26 ENCOUNTER — Encounter: Payer: Self-pay | Admitting: Physician Assistant

## 2014-02-26 VITALS — BP 120/90 | HR 72 | Temp 98.0°F | Resp 16 | Wt 192.0 lb

## 2014-02-26 DIAGNOSIS — R300 Dysuria: Secondary | ICD-10-CM

## 2014-02-26 DIAGNOSIS — N390 Urinary tract infection, site not specified: Secondary | ICD-10-CM

## 2014-02-26 LAB — POCT URINALYSIS DIPSTICK
Bilirubin, UA: NEGATIVE
GLUCOSE UA: NEGATIVE
KETONES UA: NEGATIVE
Nitrite, UA: POSITIVE
RBC UA: NEGATIVE
SPEC GRAV UA: 1.02
UROBILINOGEN UA: 1
pH, UA: 6

## 2014-02-26 MED ORDER — CIPROFLOXACIN HCL 500 MG PO TABS: 500.0000 mg | ORAL_TABLET | Freq: Two times a day (BID) | ORAL | Status: DC

## 2014-02-26 NOTE — Progress Notes (Signed)
Subjective:    Patient ID: Sharon Fowler, female    DOB: 06/06/1971, 43 y.o.   MRN: [id number]  Urinary Tract Infection  This is a new (Pt has symptoms of malodorous urine and dark colored urine with every urination.) problem. The current episode started 1 to 4 weeks ago. The problem occurs every urination. The problem has been unchanged. The patient is experiencing no pain. There has been no fever. She is sexually active. There is no history of pyelonephritis. Pertinent negatives include no chills, discharge, flank pain, frequency, hematuria, hesitancy, nausea, possible pregnancy, sweats, urgency or vomiting. She has tried increased fluids for the symptoms. The treatment provided no relief.    Review of Systems  Constitutional: Negative for fever and chills.  Respiratory: Negative for shortness of breath.   Gastrointestinal: Negative for nausea, vomiting and diarrhea.  Genitourinary: Negative for dysuria, hesitancy, urgency, frequency, hematuria and flank pain.  All other systems reviewed and are negative.  Past Medical History  Diagnosis Date  . GOITER, MULTINODULAR 07/15/2009  . HYPERLIPIDEMIA 03/25/2008  . HYPERTENSION 03/25/2008  . OBESITY 03/25/2008  . ANEMIA, IRON DEFICIENCY UNSPEC 06/11/2009    corrected with iron replacement---menstrual reelated   Past Surgical History  Procedure Laterality Date  . Laparoscopic gastric banding    . Cholecystectomy  10/91    laparascopy  . Tubal ligation      01/2003  . Cesarean section  7/91, 5/95    x2    reports that she quit smoking about 23 years ago. She does not have any smokeless tobacco history on file. She reports that she does not drink alcohol or use illicit drugs. family history includes Alcohol abuse in her father; Diabetes in her mother; Stroke in her mother. Allergies  Allergen Reactions  . Chlorthalidone     REACTION: hypokalemia (2.8)        Objective:   Physical Exam  Nursing note and vitals  reviewed. Constitutional: She is oriented to person, place, and time. She appears well-developed and well-nourished. No distress.  HENT:  Head: Normocephalic and atraumatic.  Eyes: Conjunctivae and EOM are normal. Pupils are equal, round, and reactive to light.  Neck: Normal range of motion. Neck supple.  Cardiovascular: Normal rate, regular rhythm, normal heart sounds and intact distal pulses.  Exam reveals no gallop and no friction rub.   No murmur heard. Pulmonary/Chest: Effort normal and breath sounds normal. No stridor. No respiratory distress. She has no wheezes. She has no rales. She exhibits no tenderness.  Abdominal: Soft. Bowel sounds are normal. There is no tenderness.  Musculoskeletal: Normal range of motion. She exhibits no edema.  Neurological: She is alert and oriented to person, place, and time. She has normal reflexes. No cranial nerve deficit. Coordination normal.  Skin: Skin is warm and dry. No rash noted. She is not diaphoretic. No erythema. No pallor.  Psychiatric: She has a normal mood and affect. Her behavior is normal. Judgment and thought content normal.   Filed Vitals:   02/26/14 1246  BP: 120/90  Pulse: 72  Temp: 98 F (36.7 C)  Resp: 16   Lab Results  Component Value Date   WBC 7.5 10/15/2013   HGB 13.3 10/15/2013   HCT 39.9 10/15/2013   PLT 316.0 10/15/2013   GLUCOSE 75 10/15/2013   CHOL 208* 02/06/2014   TRIG 67.0 02/06/2014   HDL 54.40 02/06/2014   LDLDIRECT 197.8 10/15/2013   LDLCALC 140* 02/06/2014   ALT 9 02/06/2014   AST 15 02/06/2014  NA 139 10/15/2013   K 3.8 10/15/2013   CL 104 10/15/2013   CREATININE 0.6 10/15/2013   BUN 10 10/15/2013   CO2 28 10/15/2013   TSH 1.28 10/15/2013   HGBA1C 5.5 10/15/2013   Urinalysis Component     Latest Ref Rng 02/26/2014  Color, UA      yellow  Clarity, UA      cloudy  Glucose      n  Bilirubin, UA      n  Ketones, UA      n  Specific Gravity, UA      1.020  RBC, UA      n  pH, UA      6.0   Protein, UA      trace  Urobilinogen, UA      1.0  Nitrite, UA      pos  Leukocytes, UA      small (1+)       Assessment & Plan:  Sharon Fowler was seen today for urinary tract infection.  Diagnoses and associated orders for this visit:  UTI (urinary tract infection) - ciprofloxacin (CIPRO) 500 MG tablet; Take 1 tablet (500 mg total) by mouth 2 (two) times daily.  Dysuria - POCT urinalysis dipstick - Culture, Urine    Patient Instructions  Cipro 537m BID for 10 days for UTI.  Follow up if symptoms worsen or do not improve despite treatment.

## 2014-02-26 NOTE — Progress Notes (Signed)
Pre visit review using our clinic review tool, if applicable. No additional management support is needed unless otherwise documented below in the visit note. 

## 2014-02-26 NOTE — Patient Instructions (Signed)
Cipro 589m BID for 10 days for UTI.  Follow up if symptoms worsen or do not improve despite treatment.  Urinary Tract Infection A urinary tract infection (UTI) can occur any place along the urinary tract. The tract includes the kidneys, ureters, bladder, and urethra. A type of germ called bacteria often causes a UTI. UTIs are often helped with antibiotic medicine.  HOME CARE   If given, take antibiotics as told by your doctor. Finish them even if you start to feel better.  Drink enough fluids to keep your pee (urine) clear or pale yellow.  Avoid tea, drinks with caffeine, and bubbly (carbonated) drinks.  Pee often. Avoid holding your pee in for a long time.  Pee before and after having sex (intercourse).  Wipe from front to back after you poop (bowel movement) if you are a woman. Use each tissue only once. GET HELP RIGHT AWAY IF:   You have back pain.  You have lower belly (abdominal) pain.  You have chills.  You feel sick to your stomach (nauseous).  You throw up (vomit).  Your burning or discomfort with peeing does not go away.  You have a fever.  Your symptoms are not better in 3 days. MAKE SURE YOU:   Understand these instructions.  Will watch your condition.  Will get help right away if you are not doing well or get worse. Document Released: 04/03/2008 Document Revised: 07/10/2012 Document Reviewed: 05/16/2012 [facility] HospitalPatient Information 2014 [place]

## 2014-03-01 LAB — URINE CULTURE

## 2014-04-16 ENCOUNTER — Encounter (INDEPENDENT_AMBULATORY_CARE_PROVIDER_SITE_OTHER): Payer: Commercial Managed Care - PPO

## 2014-08-19 ENCOUNTER — Ambulatory Visit: Payer: 59 | Admitting: Dietician

## 2014-08-21 ENCOUNTER — Encounter: Payer: 59 | Attending: General Surgery | Admitting: Dietician

## 2014-08-21 ENCOUNTER — Ambulatory Visit: Payer: 59 | Admitting: Dietician

## 2014-08-21 NOTE — Progress Notes (Signed)
Follow-up visit:   ~4.0 Years Post-Operative LAGB Surgery  Medical Nutrition Therapy:  Appt start time:  800   End time:  220.  Primary concerns today:  Post-operative bariatric surgery nutrition management. Sharon Fowler returns with a 12.5 lb wt gain since last visit. Has not had a fill since 04/2013. Not sure is she needs a fill. Take 1-2 hours to eat meals. Still gets hungry. Having trouble getting back on track. Has not been exercising. No longer eating out as much. Has been working more overtime than before.   Not taking supplements as directed. Still eating 16 oz of peanut butter per week. Has a lot of chips and dip ([place]) since last time. Has been eating a lot of ice cream. Not getting in enough fluid though not drinking during meals as much as before.  Surgery date: 10/04/10 Start weight at [facility]: 295.6 lbs Weight today: 206.5 lbs  Weight change: 12.5 lb gain Total weight lost: 89.1 lbs total  BMI: 35.4 kg/m^2  Weight goal: 170 lbs  % Weight goal met: 71%  TANITA  BODY COMP RESULTS  04/09/12  07/15/12 01/30/13 08/12/13 02/16/14 08/21/14   Fat Mass (lbs) 94.5 94.0 102.5 93.0 87.5 99.5   Fat Free Mass (lbs) 109.5 103.5 108.0 105.5 106.5 107.0   Total Body Water (lbs) 80.0 76.0 79.0 77.0 78.0 78.5   24-hr recall:  Eats 16 oz jar of peanut butter in one week with 15-20 vanilla wafers. Not interested in using PB2 since she needs to leave it at work.   B (7 AM): egg with cheese and 3-4 pieces of [place] bacon (24 g) Snk (AM): NONE (sleeping) L (7 PM): 2 pieces of chicken (thighs) with string beans and roasted potatoes (28 g) D (8-9 PM): 2 pkts oatmeal (8 g) Snk(11 PM):  Snacks on chips, queso, peanut butter, and vanilla wafers throughout the day Snk (3-4 AM):   Fluid intake: 24-28 oz sugar free Kool Aid, 12 oz water (about 40 oz) Estimated total protein intake: ~60g  Medications: No changes Supplementation:  Not taking "I'm not good at taking pills". Refuses to set up schedule to  remember  Using straws: Only at restaurants - will not drink directly out of their cups (not eating out often) Drinking while eating:  Tries to wait 30 minutes to drink after meals Hair loss: No Carbonated beverages: No N/V/D/C:  Has some constipation but it is manageable  Last Lap-Band fill: 05/01/13 @ 0.5 cc - now @ 6.5 cc total; reports she is in the green zone. Port pain is resolving.   Recent physical activity:  None   Progress Towards Goal(s):  In progress.   Nutritional Diagnosis:   [place]-3.3 Overweight/obesity related to past poor dietary habits and physical inactivity as evidenced by patient w/ recent LAGB surgery following dietary guidelines for continued weight loss.  Intervention:  Nutrition education/reinforcement.   Goals:  Follow Phase 3B:  High Protein + Non-Starchy Vegetables Eat 3-6 small meals/snacks, every 3-5 hrs  No drinking 15 min before meals, any during, and for 30 min after meals/snacks. Increase lean protein foods to meet 60-80g goal  Aim for >30 min of physical activity daily Resume (1) multivitamin and (3) doses of calcium citrate daily  Limit carbohydrate intake to 15 grams per meal/snack Work on drinking mostly water or other sugar free drinks (64 oz per day) Stop eating after at half hour Attend bariatric support group Consider contacting Everardo Beals for if you want to talk to a  counselor: [phone]  Consider keeping a food journal    Monitoring/Evaluation:  Dietary intake, exercise, lap band fills, and body weight. Follow up in 2 months

## 2014-08-21 NOTE — Patient Instructions (Addendum)
Goals:  Follow Phase 3B:  High Protein + Non-Starchy Vegetables Eat 3-6 small meals/snacks, every 3-5 hrs  No drinking 15 min before meals, any during, and for 30 min after meals/snacks. Increase lean protein foods to meet 60-80g goal  Aim for >30 min of physical activity daily Resume (1) multivitamin and (3) doses of calcium citrate daily  Limit carbohydrate intake to 15 grams per meal/snack Work on drinking mostly water or other sugar free drinks (64 oz per day) Stop eating after at half hour Attend bariatric support group Consider contacting Everardo Beals for if you want to talk to a counselor: [phone]  Consider keeping a food journal

## 2014-09-18 ENCOUNTER — Encounter: Payer: Self-pay | Admitting: Internal Medicine

## 2014-09-18 ENCOUNTER — Encounter: Payer: Self-pay | Admitting: Family Medicine

## 2014-09-18 ENCOUNTER — Ambulatory Visit (INDEPENDENT_AMBULATORY_CARE_PROVIDER_SITE_OTHER): Payer: 59 | Admitting: Family Medicine

## 2014-09-18 VITALS — BP 130/80 | HR 83 | Temp 97.9°F | Wt 214.0 lb

## 2014-09-18 DIAGNOSIS — R252 Cramp and spasm: Secondary | ICD-10-CM

## 2014-09-18 LAB — BASIC METABOLIC PANEL
BUN: 16 mg/dL (ref 6–23)
CHLORIDE: 105 meq/L (ref 96–112)
CO2: 28 meq/L (ref 19–32)
CREATININE: 0.6 mg/dL (ref 0.4–1.2)
Calcium: 9.3 mg/dL (ref 8.4–10.5)
GFR: 141.03 mL/min (ref >60.00)
Glucose, Bld: 81 mg/dL (ref 70–99)
Potassium: 3.8 mEq/L (ref 3.5–5.1)
Sodium: 139 mEq/L (ref 135–145)

## 2014-09-18 LAB — MAGNESIUM: Magnesium: 2 mg/dL (ref 1.5–2.5)

## 2014-09-18 NOTE — Patient Instructions (Signed)
Leg Cramps Leg cramps that occur during exercise can be caused by poor circulation or dehydration. However, muscle cramps that occur at rest or during the night are usually not due to any serious medical problem. Heat cramps may cause muscle spasms during hot weather.  CAUSES There is no clear cause for muscle cramps. However, dehydration may be a factor for those who do not drink enough fluids and those who exercise in the heat. Imbalances in the level of sodium, potassium, calcium or magnesium in the muscle tissue may also be a factor. Some medications, such as water pills (diuretics), may cause loss of chemicals that the body needs (like sodium and potassium) and cause muscle cramps. TREATMENT   Make sure your diet has enough fluids and essential minerals for the muscle to work normally.  Avoid strenuous exercise for several days if you have been having frequent leg cramps.  Stretch and massage the cramped muscle for several minutes.  Some medicines may be helpful in some patients with night cramps. Only take over-the-counter or prescription medicines as directed by your caregiver. SEEK IMMEDIATE MEDICAL CARE IF:   Your leg cramps become worse.  Your foot becomes cold, numb, or blue. Document Released: 11/23/2004 Document Revised: 01/08/2012 Document Reviewed: 11/10/2008 [facility] Patient Information 2015 [place], [place]. This information is not intended to replace advice given to you by your health care provider. Make sure you discuss any questions you have with your health care provider.  Drink more fluids If labs all normal, consider multivitamin daily

## 2014-09-18 NOTE — Progress Notes (Signed)
   Subjective:    Patient ID: Sharon Fowler, female    DOB: Oct 03, 1971, 43 y.o.   MRN: [id number]  HPI   Patient seen with bilateral leg cramps. Duration about 2 weeks. No recent change in routines. She does not drink a lot of fluids at baseline. She's not taking any current medications. History of hypertension and hyperlipidemia previously treated with lisinopril and atorvastatin but she had not been taking these. Her symptoms are bilateral. She has occasional mild late day edema. No dyspnea. She has tried increasing dietary potassium without much improvement.  Past Medical History  Diagnosis Date  . GOITER, MULTINODULAR 07/15/2009  . HYPERLIPIDEMIA 03/25/2008  . HYPERTENSION 03/25/2008  . OBESITY 03/25/2008  . ANEMIA, IRON DEFICIENCY UNSPEC 06/11/2009    corrected with iron replacement---menstrual reelated   Past Surgical History  Procedure Laterality Date  . Laparoscopic gastric banding    . Cholecystectomy  10/91    laparascopy  . Tubal ligation      01/2003  . Cesarean section  7/91, 5/95    x2    reports that she quit smoking about 23 years ago. She does not have any smokeless tobacco history on file. She reports that she does not drink alcohol or use illicit drugs. family history includes Alcohol abuse in her father; Diabetes in her mother; Stroke in her mother. Allergies  Allergen Reactions  . Chlorthalidone     REACTION: hypokalemia (2.8)      Review of Systems  Constitutional: Negative for fatigue.  Eyes: Negative for visual disturbance.  Respiratory: Negative for cough, chest tightness, shortness of breath and wheezing.   Cardiovascular: Negative for chest pain, palpitations and leg swelling.  Neurological: Negative for dizziness, seizures, syncope, weakness, light-headedness and headaches.       Objective:   Physical Exam  Constitutional: She appears well-developed and well-nourished.  Neck: Neck supple. No thyromegaly present.  Cardiovascular: Normal rate  and regular rhythm.   Pulmonary/Chest: Effort normal and breath sounds normal. No respiratory distress. She has no wheezes. She has no rales.  Musculoskeletal: She exhibits no edema.  Distal pulses normal.          Assessment & Plan:  Bilateral leg cramps. Check electrolytes with basic metabolic panel and magnesium level. Increased hydration. Consider tonic water. If all the above normal and not resolving with increased fluids consider multivitamin B complex

## 2014-09-18 NOTE — Progress Notes (Signed)
Pre visit review using our clinic review tool, if applicable. No additional management support is needed unless otherwise documented below in the visit note. 

## 2014-10-18 ENCOUNTER — Encounter: Payer: Self-pay | Admitting: Family Medicine

## 2014-10-19 ENCOUNTER — Other Ambulatory Visit: Payer: Self-pay

## 2014-10-19 MED ORDER — POLYMYXIN B-TRIMETHOPRIM 10000-0.1 UNIT/ML-% OP SOLN: OPHTHALMIC | Status: DC

## 2014-10-21 ENCOUNTER — Encounter: Payer: 59 | Attending: General Surgery | Admitting: Dietician

## 2014-10-21 DIAGNOSIS — E669 Obesity, unspecified: Secondary | ICD-10-CM | POA: Insufficient documentation

## 2014-10-21 DIAGNOSIS — Z9884 Bariatric surgery status: Secondary | ICD-10-CM | POA: Insufficient documentation

## 2014-10-21 DIAGNOSIS — Z6838 Body mass index (BMI) 38.0-38.9, adult: Secondary | ICD-10-CM | POA: Insufficient documentation

## 2014-10-21 DIAGNOSIS — Z713 Dietary counseling and surveillance: Secondary | ICD-10-CM | POA: Diagnosis not present

## 2014-10-21 NOTE — Progress Notes (Signed)
Follow-up visit:   ~4.0 Years Post-Operative LAGB Surgery  Medical Nutrition Therapy:  Appt start time:  730   End time:  800.  Primary concerns today:  Post-operative bariatric surgery nutrition management. Sharon Fowler returns with a 12.5 lb wt gain since last visit. Feels like she may need a fill. Made an appointment to see Jonni Sanger on 11/12/2014. Take 1-2 hours to eat meals. Still gets hungry. Eats ice cream at least one time per day. Refuses to take supplements. No longer having chips and dip. No longer eating peanut butter. Probably eating a little more than before. Cannot eat ground beef.  Not sure why she is gaining weight. Starting working out again.   Surgery date: 10/04/10 Start weight at [facility]: 295.6 lbs Weight today: 222.5 lbs  Weight change: 16 lb gain Total weight lost: 72.6 lbs total  BMI: 38.2 kg/m^2  Weight goal: 170 lbs  % Weight goal met: 71%  TANITA  BODY COMP RESULTS  04/09/12  07/15/12 01/30/13 08/12/13 02/16/14 08/21/14 10/21/14   Fat Mass (lbs) 94.5 94.0 102.5 93.0 87.5 99.5 108.5   Fat Free Mass (lbs) 109.5 103.5 108.0 105.5 106.5 107.0 114.0   Total Body Water (lbs) 80.0 76.0 79.0 77.0 78.0 78.5 83.5   24-hr recall:    B (7 AM): egg with cheese and 3-4 pieces of [place] bacon (24 g) Snk (AM): NONE (sleeping) L (7 PM): 2 pieces of chicken (thighs) with string beans and roasted potatoes (28 g) D (8-9 PM): 2 pkts oatmeal with protein powder with granola (18 g) Snk(11 PM):   ice cream Snk (3-4 AM): none  Fluid intake: fruit juice, soda, etc (less than 1 x week) 72 oz water  Estimated total protein intake: ~70g  Medications: No changes Supplementation:  Refuses to take vitamins and calcium   Using straws: Only at restaurants - will not drink directly out of their cups (not eating out often) Drinking while eating:  Tries to wait 30 minutes to drink after meals Hair loss: No Carbonated beverages: No N/V/D/C:  Has some constipation but it is manageable  Last Lap-Band  fill: 05/01/13 @ 0.5 cc - now @ 6.5 cc total; reports she is in the green zone.   Recent physical activity:  1 x week going to [employer] cardio + weights for about about 60 minutes  Progress Towards Goal(s):  In progress.   Nutritional Diagnosis:   [place]-3.3 Overweight/obesity related to past poor dietary habits and physical inactivity as evidenced by patient w/ recent LAGB surgery following dietary guidelines for continued weight loss.  Intervention:  Nutrition education/reinforcement.   Goals:  Follow Phase 3B:  High Protein + Non-Starchy Vegetables Eat 3-6 small meals/snacks, every 3-5 hrs  No drinking 15 min before meals, any during, and for 30 min after meals/snacks. Increase lean protein foods to meet 60-80g goal  Aim for >30 min of physical activity daily Resume (1) multivitamin and (3) doses of calcium citrate daily  Limit carbohydrate intake to 15 grams per meal/snack Work on drinking mostly water or other sugar free drinks (64 oz per day) Stop eating after at half hour Attend bariatric support group Consider contacting Everardo Beals for if you want to talk to a counselor: [phone]  Consider keeping a food journal    Monitoring/Evaluation:  Dietary intake, exercise, lap band fills, and body weight. Follow up in 4 months

## 2014-10-21 NOTE — Patient Instructions (Addendum)
Goals:  Follow Phase 3B:  High Protein + Non-Starchy Vegetables Eat 3-6 small meals/snacks, every 3-5 hrs  No drinking 15 min before meals, any during, and for 30 min after meals/snacks. Increase lean protein foods to meet 60-80g goal  Aim for >30 min of physical activity daily - plan to exercise 2-3 x week Resume (1) multivitamin and (3) doses of calcium citrate daily  Limit carbohydrate intake to 15 grams per meal/snack Eat protein foods first (see list) and then add non starchy vegetables. Limit carbohydrates (potatoes, bread, granola, ice cream, sugar). Stop eating after at half hour - after you get your fill  Attend bariatric support group Consider contacting Everardo Beals for if you want to talk to a counselor: [phone]  Consider keeping a food journal

## 2014-12-15 ENCOUNTER — Encounter: Payer: 59 | Attending: General Surgery | Admitting: Dietician

## 2014-12-15 VITALS — Ht 64.0 in | Wt 220.0 lb

## 2014-12-15 DIAGNOSIS — Z6838 Body mass index (BMI) 38.0-38.9, adult: Secondary | ICD-10-CM | POA: Insufficient documentation

## 2014-12-15 DIAGNOSIS — Z713 Dietary counseling and surveillance: Secondary | ICD-10-CM | POA: Diagnosis not present

## 2014-12-15 DIAGNOSIS — E669 Obesity, unspecified: Secondary | ICD-10-CM | POA: Diagnosis not present

## 2014-12-15 DIAGNOSIS — Z9884 Bariatric surgery status: Secondary | ICD-10-CM | POA: Insufficient documentation

## 2014-12-15 NOTE — Progress Notes (Signed)
Follow-up visit:   ~4.0 Years Post-Operative LAGB Surgery  Medical Nutrition Therapy:  Appt start time:  1130   End time:  1200.  Primary concerns today:  Post-operative bariatric surgery nutrition management. Sharon Fowler returns with a 2.5 lb wt loss since last visit. She has cut out ice cream, cut back on peanut butter, cut out chips and dip and feeling like she is not losing enough weight. Not exercising as much as she planned but does take the stairs. Did not get a fill in January. Trying to stop eating after 30 minutes but still feeling hungry.   Feels like she does not have enough restriction. Will see Jonni Sanger at [place] this Thursday and may be getting a fill.  Did go to support group last month and go again next month. They discussed vitamins and minerals and websites which she thinks will be helpful. Still feels like she has a sweet tooth and doesn't know how to cope with these cravings.   Surgery date: 10/04/10 Start weight at [facility]: 295.6 lbs Weight today: 220.0 lbs  Weight change: 2.5 lb loss Total weight lost: 75 lbs total  BMI: 37.8 kg/m^2  Weight goal: 170 lbs   TANITA  BODY COMP RESULTS  04/09/12  07/15/12 01/30/13 08/12/13 02/16/14 08/21/14 10/21/14 12/15/14   Fat Mass (lbs) 94.5 94.0 102.5 93.0 87.5 99.5 108.5 105.5   Fat Free Mass (lbs) 109.5 103.5 108.0 105.5 106.5 107.0 114.0 114.5   Total Body Water (lbs) 80.0 76.0 79.0 77.0 78.0 78.5 83.5 84.0   24-hr recall:    B (7 AM): egg with cheese and 3-4 pieces of [place] bacon 1-2 x week (24 g) or nothing 2-3 x week Snk (AM): NONE (sleeping) L (7 PM): 2 pieces of chicken (thighs) with string beans (28 g) D (8-9 PM): 2 pkts oatmeal with protein powder with granola (18 g) Snk(11 PM):   [place] yogurt with granola which has protein  Snk (3-4 AM): none  Fluid intake: soda, etc (less than 1 x week) 40 oz water, hot chocolate with protein powder (4 x week)  Estimated total protein intake: ~70g  Medications: No changes Supplementation:   Refuses to take vitamins and calcium   Using straws: Only at restaurants - will not drink directly out of their cups (not eating out often) Drinking while eating:  Tries to wait 30 minutes to drink after meals Hair loss: No Carbonated beverages: No N/V/D/C:  Has some constipation but it is manageable  Last Lap-Band fill: 05/01/13 @ 0.5 cc - now @ 6.5 cc total; reports she is in the green zone.   Recent physical activity:  1 x week going to [employer] cardio + weights for about about 60 minutes  Progress Towards Goal(s):  In progress.   Nutritional Diagnosis:   [place]-3.3 Overweight/obesity related to past poor dietary habits and physical inactivity as evidenced by patient w/ recent LAGB surgery following dietary guidelines for continued weight loss.  Intervention:  Nutrition education/reinforcement.   Goals:  Follow Phase 3B:  High Protein + Non-Starchy Vegetables Eat 3-6 small meals/snacks, every 3-5 hrs-Eat breakfast No drinking 15 min before meals, any during, and for 30 min after meals/snacks. Increase lean protein foods to meet 60-80g goal  Aim for >30 min of physical activity daily - plan to exercise 2-3 x week (think about getting a personal trainer) h Resume (1) multivitamin and (3) doses of calcium citrate daily  Limit carbohydrate intake to 15 grams per meal/snack Eat protein foods first (see  list) and then add non starchy vegetables. Stop eating after at half hour - after you get your fill  Consider contacting Everardo Beals for if you want to talk to a counselor: [phone]  Continue keeping a food journal if it is helpful Talk to your doctor about getting vitamin blood work done   Monitoring/Evaluation:  Dietary intake, exercise, lap band fills, and body weight. Follow up in 2 months

## 2014-12-15 NOTE — Patient Instructions (Addendum)
Goals:  Follow Phase 3B:  High Protein + Non-Starchy Vegetables Eat 3-6 small meals/snacks, every 3-5 hrs-Eat breakfast No drinking 15 min before meals, any during, and for 30 min after meals/snacks. Increase lean protein foods to meet 60-80g goal  Aim for >30 min of physical activity daily - plan to exercise 2-3 x week (think about getting a personal trainer) h Resume (1) multivitamin and (3) doses of calcium citrate daily  Limit carbohydrate intake to 15 grams per meal/snack Eat protein foods first (see list) and then add non starchy vegetables. Stop eating after at half hour - after you get your fill  Consider contacting Everardo Beals for if you want to talk to a counselor: [phone]  Continue keeping a food journal if it is helpful Talk to your doctor about getting vitamin blood work done

## 2015-02-22 ENCOUNTER — Encounter: Payer: 59 | Attending: General Surgery | Admitting: Dietician

## 2015-02-22 VITALS — Ht 64.0 in | Wt 217.0 lb

## 2015-02-22 DIAGNOSIS — Z713 Dietary counseling and surveillance: Secondary | ICD-10-CM | POA: Diagnosis not present

## 2015-02-22 DIAGNOSIS — E669 Obesity, unspecified: Secondary | ICD-10-CM | POA: Diagnosis not present

## 2015-02-22 DIAGNOSIS — Z9884 Bariatric surgery status: Secondary | ICD-10-CM | POA: Diagnosis not present

## 2015-02-22 DIAGNOSIS — Z6838 Body mass index (BMI) 38.0-38.9, adult: Secondary | ICD-10-CM | POA: Insufficient documentation

## 2015-02-22 NOTE — Progress Notes (Signed)
Follow-up visit:  4.0+ Years Post-Operative LAGB Surgery  Medical Nutrition Therapy:  Appt start time:  730   End time: 254.  Primary concerns today:  Post-operative bariatric surgery nutrition management. Sharon Fowler returns with a 3 lb weight loss though did gain 3.5 lbs of fat mass. Had fill late last week and and feels like this will work since she is eating less. Eating protein foods first and if she has room then will have vegetables.   Has been cutting back on trigger foods such as peanut butter.  Has had a protein smoothie 2 x in April (fruit) and eats oatmeal. Not meeting protein goal. Trying to manage sugar cravings on her own. Has not seen a Social worker.   Surgery date: 10/04/10 Start weight at [facility]: 295.6 lbs Weight today: 217.0 lbs  Weight change: 3 lb loss Total weight lost: 78 lbs total  BMI: 37.8 kg/m^2  Weight goal: 170 lbs   TANITA  BODY COMP RESULTS  04/09/12  07/15/12 01/30/13 08/12/13 02/16/14 08/21/14 10/21/14 12/15/14 02/22/15   Fat Mass (lbs) 94.5 94.0 102.5 93.0 87.5 99.5 108.5 105.5 109.0   Fat Free Mass (lbs) 109.5 103.5 108.0 105.5 106.5 107.0 114.0 114.5 108.0   Total Body Water (lbs) 80.0 76.0 79.0 77.0 78.0 78.5 83.5 84.0 78.0   24-hr recall:    B (7 AM): 2 packets of oatmeal sweetened (5-10 g)  Snk (3PM): [place] stick with laughing cow cheese (9 g) or may have peppers and other vegetables L (7 PM): 2 slices meatloaf (3 oz) (21 g) D (8-9 PM): none Snk(11 PM):   none Snk (3-4 AM): none  Fluid intake: 40-64 oz water Estimated total protein intake: ~35-10 g  Medications: No changes Supplementation:  Refuses to take vitamins and calcium   Using straws: yes with smoothies Drinking while eating:  Tries to wait 30 minutes to drink after meals Hair loss: No Carbonated beverages: No N/V/D/C:  Had some nausea/vomiting if she doesn't chew well Last Lap-Band fill: Had a fill last week  Recent physical activity:  1 x week going to [employer] cardio + weights for  about about 60 minutes, uses stairs when possible  Progress Towards Goal(s):  In progress.   Nutritional Diagnosis:   [place]-3.3 Overweight/obesity related to past poor dietary habits and physical inactivity as evidenced by patient w/ recent LAGB surgery following dietary guidelines for continued weight loss.  Intervention:  Nutrition education/reinforcement.  Goals:  Follow Phase 3B:  High Protein + Non-Starchy Vegetables Eat 3-6 small meals/snacks, every 3-5 hrs No drinking 15 min before meals, any during, and for 30 min after meals/snacks. Increase lean protein foods to meet 60-80g goal  Aim for >30 min of physical activity daily - plan to exercise 2-3 x week (think about getting a Physiological scientist)  Resume (1) multivitamin and (3) doses of calcium citrate daily  Take a complete multivitamin flinstone complete (adult dose) Consider contacting Everardo Beals for if you want to talk to a counselor: [phone]  Talk to your doctor about getting vitamin blood work done Have eggs, [place] bacon, [place] sausage instead of oatmeal for breakfast Have protein foods and vegetables that you have to chew well to help keep you full longer Aim to get 64 oz of fluid in each day  Monitoring/Evaluation:  Dietary intake, exercise, lap band fills, and body weight. Follow up in 3 months

## 2015-02-22 NOTE — Patient Instructions (Addendum)
Goals:  Follow Phase 3B:  High Protein + Non-Starchy Vegetables Eat 3-6 small meals/snacks, every 3-5 hrs No drinking 15 min before meals, any during, and for 30 min after meals/snacks. Increase lean protein foods to meet 60-80g goal  Aim for >30 min of physical activity daily - plan to exercise 2-3 x week (think about getting a Physiological scientist)  Resume (1) multivitamin and (3) doses of calcium citrate daily  Take a complete multivitamin flinstone complete (adult dose) Consider contacting Everardo Beals for if you want to talk to a counselor: [phone]  Talk to your doctor about getting vitamin blood work done Have eggs, [place] bacon, [place] sausage instead of oatmeal for breakfast Have protein foods and vegetables that you have to chew well to help keep you full longer Aim to get 64 oz of fluid in each day

## 2015-02-24 ENCOUNTER — Ambulatory Visit: Payer: 59 | Admitting: Family Medicine

## 2015-05-24 ENCOUNTER — Ambulatory Visit: Payer: 59 | Admitting: Dietician

## 2015-05-25 ENCOUNTER — Encounter: Payer: 59 | Attending: General Surgery | Admitting: Dietician

## 2015-05-25 ENCOUNTER — Encounter: Payer: Self-pay | Admitting: Dietician

## 2015-05-25 VITALS — Ht 64.0 in | Wt 211.5 lb

## 2015-05-25 DIAGNOSIS — Z48815 Encounter for surgical aftercare following surgery on the digestive system: Secondary | ICD-10-CM | POA: Diagnosis not present

## 2015-05-25 DIAGNOSIS — Z713 Dietary counseling and surveillance: Secondary | ICD-10-CM | POA: Insufficient documentation

## 2015-05-25 DIAGNOSIS — Z6836 Body mass index (BMI) 36.0-36.9, adult: Secondary | ICD-10-CM | POA: Insufficient documentation

## 2015-05-25 DIAGNOSIS — E669 Obesity, unspecified: Secondary | ICD-10-CM | POA: Insufficient documentation

## 2015-05-25 NOTE — Progress Notes (Signed)
Follow-up visit:  4.0+ Years Post-Operative LAGB Surgery  Medical Nutrition Therapy:  Appt start time:  292   End time: 820 .  Primary concerns today:  Post-operative bariatric surgery nutrition management. Sharon Fowler returns with a 5.5 lb weight loss. Has not had another fill in since that last visit. Cancelled visit at surgeon's office since she feels like she is in a good place. Has cut back on oatmeal and still having a challenge getting in water. Protein intake is about the same. Having some carbs (not often) like granola bars. Has ice cream on occasion but not every day.   Not vomiting often but will vomit something "goes down wrong".   Having stress since mother is in the hospital.   Surgery date: 10/04/10 Start weight at [facility]: 295.6 lbs Weight today: 211.5  Weight change: 5.5 lb loss Total weight lost: 83.5 lbs total  BMI: 36.3 kg/m^2  Weight goal: 170 lbs   TANITA  BODY COMP RESULTS  04/09/12  07/15/12 01/30/13 08/12/13 02/16/14 08/21/14 10/21/14 12/15/14 02/22/15 05/25/15   Fat Mass (lbs) 94.5 94.0 102.5 93.0 87.5 99.5 108.5 105.5 109.0 106.0   Fat Free Mass (lbs) 109.5 103.5 108.0 105.5 106.5 107.0 114.0 114.5 108.0 105.5   Total Body Water (lbs) 80.0 76.0 79.0 77.0 78.0 78.5 83.5 84.0 78.0 77.0   24-hr recall:    B (7 AM): 2 strips of [place] bacon (5 g)  Snk (12-1 PM) 2 strips of [place] bacon (5 g) L ((PM): 3-4 baked fish overnight (21-28 g) D (8-9 PM): none Snk(11 PM):   none Snk (3-4 AM): none  Fluid intake: at least 32 oz water and 16-24 oz sugar free Kool Aid  Estimated total protein intake: ~31-38 g  Medications: No changes Supplementation:  Refuses to take vitamins and calcium   Using straws: No Drinking while eating:  Tries to wait 30 minutes to drink after meals Hair loss: No Carbonated beverages: about 1 x moth N/V/D/C:  Had some nausea/vomiting if she doesn't chew well Last Lap-Band fill: no fill in past 3 months  Recent physical activity:  uses stairs when  possible  Progress Towards Goal(s):  In progress.   Nutritional Diagnosis:   [place]-3.3 Overweight/obesity related to past poor dietary habits and physical inactivity as evidenced by patient w/ recent LAGB surgery following dietary guidelines for continued weight loss.  Intervention:  Nutrition education/reinforcement.  Goals:  Follow Phase 3B:  High Protein + Non-Starchy Vegetables Eat 3-6 small meals/snacks, every 3-5 hrs No drinking 15 min before meals, any during, and for 30 min after meals/snacks. Increase lean protein foods to meet 60-80g goal  Aim for >30 min of physical activity daily - think about adding additional exercise if possible Resume (1) multivitamin and (3) doses of calcium citrate daily  Take a complete multivitamin flinstone complete (adult dose) Talk to your doctor about getting vitamin blood work done Aim to get 64 oz of fluid in each day Try Atkins lift to get in more protein and fluid   Monitoring/Evaluation:  Dietary intake, exercise, lap band fills, and body weight. Follow up in 6 months

## 2015-05-25 NOTE — Patient Instructions (Addendum)
Goals:  Follow Phase 3B:  High Protein + Non-Starchy Vegetables Eat 3-6 small meals/snacks, every 3-5 hrs No drinking 15 min before meals, any during, and for 30 min after meals/snacks. Increase lean protein foods to meet 60-80g goal  Aim for >30 min of physical activity daily - think about adding additional exercise if possible Resume (1) multivitamin and (3) doses of calcium citrate daily  Take a complete multivitamin flinstone complete (adult dose) Talk to your doctor about getting vitamin blood work done Aim to get 64 oz of fluid in each day Try Atkins lift to get in more protein and fluid

## 2015-06-29 ENCOUNTER — Ambulatory Visit (INDEPENDENT_AMBULATORY_CARE_PROVIDER_SITE_OTHER): Payer: 59 | Admitting: Adult Health

## 2015-06-29 ENCOUNTER — Encounter: Payer: Self-pay | Admitting: Adult Health

## 2015-06-29 VITALS — BP 104/78 | Temp 98.6°F | Ht 64.0 in | Wt 216.3 lb

## 2015-06-29 DIAGNOSIS — Z7689 Persons encountering health services in other specified circumstances: Secondary | ICD-10-CM

## 2015-06-29 DIAGNOSIS — Z7189 Other specified counseling: Secondary | ICD-10-CM | POA: Diagnosis not present

## 2015-06-29 DIAGNOSIS — I10 Essential (primary) hypertension: Secondary | ICD-10-CM

## 2015-06-29 NOTE — Patient Instructions (Signed)
It was great meeting you today!  Please continue to work on diet and exercise.   Follow up in a month or two for your complete physical.   If you need anything in the meantime, please let me know.

## 2015-06-29 NOTE — Progress Notes (Signed)
HPI:  Sharon Fowler is here to establish care.  Last PCP and physical:09/2013  Immunizations:UTD Diet: She eats a lot of processed food or fast foods. She works nights and finds it hard to [employer] Exercise:She walks stairs at work.  Pap Smear: Has appointment for September   Has the following chronic problems that require follow up and concerns today:  Weight loss She has had gastric banding done in 2011 and had initially lost 90 pounds, she endorses that she does not exercise and does not eat healthy. Her diet includes a lot of fast food and processed foods. She has gained back about 30 pounds. She understands that she needs to go to the gym and she needs to eat healthy but she does not have motivation to do so.   HTN - She has taken herself off her lisinopril and endorses that her blood continues to be controlled. Today in the office it is 104/78    ROS negative for unless reported above: fevers, chills,feeling poorly, unintentional weight loss, hearing or vision loss, chest pain, palpitations, leg claudication, struggling to breath,Not feeling congested in the chest, no orthopenia, no cough,no wheezing, normal appetite, no soft tissue swelling, no hemoptysis, melena, hematochezia, hematuria, falls, loc, si, or thoughts of self harm.    Past Medical History  Diagnosis Date  . GOITER, MULTINODULAR 07/15/2009  . HYPERLIPIDEMIA 03/25/2008  . HYPERTENSION 03/25/2008  . OBESITY 03/25/2008  . ANEMIA, IRON DEFICIENCY UNSPEC 06/11/2009    corrected with iron replacement---menstrual reelated    Past Surgical History  Procedure Laterality Date  . Laparoscopic gastric banding    . Cholecystectomy  10/91    laparascopy  . Tubal ligation      01/2003  . Cesarean section  7/91, 5/95    x2    Family History  Problem Relation Age of Onset  . Diabetes Mother   . Stroke Mother   . Alcohol abuse Father     Social History   Social History  . Marital Status: Married    Spouse  Name: N/A  . Number of Children: N/A  . Years of Education: N/A   Social History Main Topics  . Smoking status: Former Smoker    Quit date: 01/02/1991  . Smokeless tobacco: None  . Alcohol Use: No  . Drug Use: No  . Sexual Activity: Not Asked   Other Topics Concern  . None   Social History Narrative     Current outpatient prescriptions:  .  atorvastatin (LIPITOR) 10 MG tablet, Take 1 tablet (10 mg total) by mouth daily. Or as directed (Patient not taking: Reported on 06/29/2015), Disp: 90 tablet, Rfl: 3 .  lisinopril (PRINIVIL,ZESTRIL) 40 MG tablet, Take 1 tablet (40 mg total) by mouth daily. (Patient not taking: Reported on 06/29/2015), Disp: 90 tablet, Rfl: 3  EXAM:  Filed Vitals:   06/29/15 0956  BP: 104/78  Temp: 98.6 F (37 C)    Body mass index is 37.11 kg/(m^2).  GENERAL: vitals reviewed and listed above, alert, oriented, appears well hydrated and in no acute distress.Obese.   HEENT: atraumatic, conjunttiva clear, no obvious abnormalities on inspection of external nose and ears. TM visualize, no cerumen impaction. Is wearing classes.   NECK: Neck is soft and supple without masses, no adenopathy or thyromegaly, trachea midline, no JVD. Normal range of motion.   LUNGS: clear to auscultation bilaterally, no wheezes, rales or rhonchi, good air movement  CV: Regular rate and rhythm, normal S1/S2, no audible murmurs,  gallops, or rubs. No carotid bruit and no peripheral edema.   MS: moves all extremities without noticeable abnormality. No edema noted  Abd: soft/nontender/nondistended/normal bowel sounds . Obese around abdomen.   Skin: warm and dry, no rash   Extremities: No clubbing, cyanosis, or edema. Capillary refill is WNL. Pulses intact bilaterally in upper and lower extremities.   Neuro: CN II-XII intact, sensation and reflexes normal throughout, 5/5 muscle strength in bilateral upper and lower extremities. Normal finger to nose. Normal rapid alternating  movements. Normal romberg. No pronator drift.   PSYCH: pleasant and cooperative, no obvious depression or anxiety  ASSESSMENT AND PLAN: 1. Encounter to establish care - Follow up in one month for CPE - Follow up sooner if needed  2. Essential hypertension - Blood pressure controlled without medications.  - Continue to monirot.   3. Obesity, morbid - Stressed importance of exercise and healthy eating.  - She can make meals on days she has off from work for the rest of the week - Motivation to go to the gym seems to be the biggest hurdle with her.   . -We reviewed the PMH, PSH, FH, SH, Meds and Allergies. -We provided refills for any medications we will prescribe as needed. -We addressed current concerns per orders and patient instructions. -We have asked for records for pertinent exams, studies, vaccines and notes from previous providers. -We have advised patient to follow up per instructions below.   -Patient advised to return or notify a provider immediately if symptoms worsen or persist or new concerns arise.    Dorothyann Peng, AGNP

## 2015-07-18 ENCOUNTER — Encounter: Payer: Self-pay | Admitting: Adult Health

## 2015-07-24 ENCOUNTER — Encounter: Payer: Self-pay | Admitting: Adult Health

## 2015-08-05 ENCOUNTER — Other Ambulatory Visit: Payer: 59

## 2015-08-13 ENCOUNTER — Encounter: Payer: 59 | Admitting: Adult Health

## 2015-11-25 ENCOUNTER — Ambulatory Visit: Payer: 59 | Admitting: Dietician

## 2016-02-18 ENCOUNTER — Ambulatory Visit (INDEPENDENT_AMBULATORY_CARE_PROVIDER_SITE_OTHER): Payer: 59

## 2016-02-18 DIAGNOSIS — Z111 Encounter for screening for respiratory tuberculosis: Secondary | ICD-10-CM

## 2016-02-21 LAB — TB SKIN TEST
INDURATION: 0 mm
TB SKIN TEST: NEGATIVE

## 2016-05-05 ENCOUNTER — Other Ambulatory Visit: Payer: Self-pay | Admitting: General Surgery

## 2016-05-05 DIAGNOSIS — Z9884 Bariatric surgery status: Secondary | ICD-10-CM | POA: Diagnosis not present

## 2016-05-05 DIAGNOSIS — R1012 Left upper quadrant pain: Secondary | ICD-10-CM

## 2016-05-11 ENCOUNTER — Other Ambulatory Visit: Payer: 59

## 2016-05-12 ENCOUNTER — Ambulatory Visit
Admission: RE | Admit: 2016-05-12 | Discharge: 2016-05-12 | Disposition: A | Discharge status: Home / Self Care | Payer: 59 | Source: Ambulatory Visit | Attending: General Surgery | Admitting: General Surgery

## 2016-05-12 DIAGNOSIS — R1012 Left upper quadrant pain: Secondary | ICD-10-CM

## 2016-07-14 ENCOUNTER — Encounter: Payer: Self-pay | Admitting: Adult Health

## 2016-08-02 ENCOUNTER — Encounter (HOSPITAL_COMMUNITY): Payer: Self-pay

## 2016-08-15 DIAGNOSIS — N760 Acute vaginitis: Secondary | ICD-10-CM | POA: Diagnosis not present

## 2016-08-15 DIAGNOSIS — Z6841 Body Mass Index (BMI) 40.0 and over, adult: Secondary | ICD-10-CM | POA: Diagnosis not present

## 2016-08-15 DIAGNOSIS — Z1231 Encounter for screening mammogram for malignant neoplasm of breast: Secondary | ICD-10-CM | POA: Diagnosis not present

## 2016-08-15 DIAGNOSIS — Z124 Encounter for screening for malignant neoplasm of cervix: Secondary | ICD-10-CM | POA: Diagnosis not present

## 2016-08-15 DIAGNOSIS — Z01419 Encounter for gynecological examination (general) (routine) without abnormal findings: Secondary | ICD-10-CM | POA: Diagnosis not present

## 2016-08-15 MED FILL — metroNIDAZOLE 500 MG TABS: 500 | 7 days supply | Qty: 14 | Fill #0

## 2016-08-18 ENCOUNTER — Telehealth: Payer: Self-pay | Admitting: Adult Health

## 2016-08-18 NOTE — Telephone Encounter (Signed)
That is ok with me

## 2016-08-18 NOTE — Telephone Encounter (Signed)
Pt has seen Tommi Rumps a year ago, only one visit, and prefers to see a female. Pt would like to switch to [place].  Is that ok with you Tommi Rumps? OK with you Gregary Signs?

## 2016-08-18 NOTE — Telephone Encounter (Signed)
Okay with me 

## 2016-08-24 ENCOUNTER — Encounter: Payer: Self-pay | Admitting: Family Medicine

## 2016-08-24 ENCOUNTER — Ambulatory Visit (INDEPENDENT_AMBULATORY_CARE_PROVIDER_SITE_OTHER): Payer: 59 | Admitting: Family Medicine

## 2016-08-24 VITALS — BP 124/98 | HR 92 | Temp 98.4°F | Wt 244.0 lb

## 2016-08-24 DIAGNOSIS — E785 Hyperlipidemia, unspecified: Secondary | ICD-10-CM | POA: Diagnosis not present

## 2016-08-24 DIAGNOSIS — I10 Essential (primary) hypertension: Secondary | ICD-10-CM | POA: Diagnosis not present

## 2016-08-24 DIAGNOSIS — Z7689 Persons encountering health services in other specified circumstances: Secondary | ICD-10-CM | POA: Diagnosis not present

## 2016-08-24 MED ORDER — LISINOPRIL 10 MG PO TABS: 10.0000 mg | ORAL_TABLET | Freq: Every day | ORAL | 3 refills | Status: DC

## 2016-08-24 MED ORDER — ATORVASTATIN CALCIUM 10 MG PO TABS: 10.0000 mg | ORAL_TABLET | Freq: Every day | ORAL | 3 refills | Status: DC

## 2016-08-24 MED FILL — LISINOPRIL 10 MG TABLET: 10 | 90 days supply | Qty: 90 | Fill #0

## 2016-08-24 MED FILL — ATORVASTATIN 10 MG TABLET: 10 | 90 days supply | Qty: 90 | Fill #0

## 2016-08-24 NOTE — Progress Notes (Signed)
Patient ID: Sharon Fowler, female   DOB: 06/28/1971, 45 y.o.   MRN: [id number]  Patient presents to clinic today to establish care.   Chronic Issues:  HTN:  She reports intermittently taking her lisinopril and atorvastatin one year ago because she does not like taking medications.  After the recent loss of her mother who suffered a stroke, she is now interested in resuming her medications on a regular basis. She does not monitor her BP at home. She reports that her BP at her gynecology office last week was "120s/90s" She denies chest pain, palpitations, SOB, numbness, tingling, HAs, nosebleeds, numbness, tingling, or weakness. Last lab work was in 2015 and she is due for a CPE.  Hyperlipidemia: She denies myalgias on atorvastatin. Diet consists of processed and fast foods. She reports working nights with 2 jobs and has difficulty finding time to [employer].    Lab Results  Component Value Date   CHOL 208 (H) 02/06/2014   HDL 54.40 02/06/2014   LDLCALC 140 (H) 02/06/2014   LDLDIRECT 197.8 10/15/2013   TRIG 67.0 02/06/2014   CHOLHDL 4 02/06/2014     Morbid Obesity/Weight loss:  Gastric banding done in 2011 and she reports total loss of 92 lbs but has gained about 80 lbs.since that time. She endorses a diet that is high in fat with fast and processed foods.  She does not desire to go to they gym but reports trying to walk when she has an opportunity which is very limited. BMI:  41.88     Health Maintenance: Dental -- Every 3 months Vision -- UTD Immunizations -- UTD Mammogram --Followed by gynecology  PAP -- Followed by gynecology    Review of Systems  Constitutional: Negative for chills and fever.  HENT: Negative for congestion and sore throat.   Eyes: Negative for blurred vision and double vision.  Respiratory: Negative for cough, shortness of breath and wheezing.   Cardiovascular: Negative for chest pain, palpitations and leg swelling.  Gastrointestinal: Negative for  abdominal pain, heartburn, nausea and vomiting.  Genitourinary: Negative for dysuria and frequency.  Musculoskeletal: Negative for back pain and myalgias.  Skin: Negative for rash.  Neurological: Negative for dizziness, tingling and headaches.  Psychiatric/Behavioral:       Denies depressed or anxious mood   Past Medical History:  Diagnosis Date  . ANEMIA, IRON DEFICIENCY UNSPEC 06/11/2009   corrected with iron replacement---menstrual reelated  . GOITER, MULTINODULAR 07/15/2009  . HYPERLIPIDEMIA 03/25/2008  . HYPERTENSION 03/25/2008  . OBESITY 03/25/2008     Social History   Social History  . Marital status: Married    Spouse name: N/A  . Number of children: N/A  . Years of education: N/A   Occupational History  . Not on file.   Social History Main Topics  . Smoking status: Former Smoker    Quit date: 01/02/1991  . Smokeless tobacco: Not on file  . Alcohol use 0.0 oz/week     Comment: occ  . Drug use: No  . Sexual activity: Not on file   Other Topics Concern  . 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    She likes to sleep when she is not at work.     Past Surgical History:  Procedure Laterality Date  . CESAREAN SECTION  7/91, 5/95   x2  . CHOLECYSTECTOMY  10/91   laparascopy  . LAPAROSCOPIC GASTRIC BANDING  2011  . TUBAL LIGATION     01/2003    Family History  Problem Relation Age of Onset  . Diabetes Mother   . Stroke Mother   . Alcohol abuse Father   . Stroke Father     Allergies  Allergen Reactions  . Chlorthalidone     REACTION: hypokalemia (2.8)    No current outpatient prescriptions on file prior to visit.   No current facility-administered medications on file prior to visit.     BP (!) 124/98   Pulse 92   Temp 98.4 F (36.9 C) (Oral)   Wt 244 lb (110.7 kg)   SpO2 90%   BMI 41.88 kg/m    Physical Exam  Constitutional: She is oriented to  person, place, and time and well-developed, well-nourished, and in no distress.  Obese  HENT:  Right Ear: Tympanic membrane normal.  Left Ear: Tympanic membrane normal.  Nose: Right sinus exhibits no maxillary sinus tenderness and no frontal sinus tenderness. Left sinus exhibits no maxillary sinus tenderness and no frontal sinus tenderness.  Mouth/Throat: Mucous membranes are normal. No oropharyngeal exudate or posterior oropharyngeal erythema.  Eyes: Pupils are equal, round, and reactive to light. No scleral icterus.  Neck: Neck supple.  Cardiovascular: Normal rate, regular rhythm and intact distal pulses.   Pulmonary/Chest: Effort normal and breath sounds normal. She has no wheezes. She has no rales.  Abdominal: Soft. Bowel sounds are normal. There is no tenderness.  Lymphadenopathy:    She has no cervical adenopathy.  Neurological: She is alert and oriented to person, place, and time. Coordination normal.  Skin: Skin is warm and dry. No rash noted.  Psychiatric: Mood, memory, affect and judgment normal.    Assessment/Plan:  1. Essential hypertension Resume lisinopril at 10 mg versus 40 mg that was prescribed prior to gastric banding when BP had systolic ranges in the 347Q. She reports being off of lisinopril for several months and BP today is 124/98. Advised monitoring her BP at home once daily for 2 weeks and follow up for BP evaluation and CPE. Lab work will be completed tomorrow morning as she is not fasting today. Discussed dietary changes including the DASH diet and written information was provided.  - lisinopril (PRINIVIL,ZESTRIL) 10 MG tablet; Take 1 tablet (10 mg total) by mouth daily.  Dispense: 90 tablet; Refill: 3 - CBC with Differential/Platelet; Future - Hemoglobin A1c; Future - Hepatic function panel; Future - Basic metabolic panel; Future - TSH; Future  2. Hyperlipidemia, unspecified hyperlipidemia type Resume atorvastatin due to prior lipid levels; recent weight  gain; and no improvement in diet. Will obtain lipid level tomorrow with fasting labs. - atorvastatin (LIPITOR) 10 MG tablet; Take 1 tablet (10 mg total) by mouth daily.  Dispense: 90 tablet; Refill: 3 - Lipid panel; Future  3. Morbidly obese ([place]) Discussed the importance of improving her diet and decreasing her fast food and salt intake. Discussed meal options that are easy to prepare and have on hand to avoid choosing fast food options for convenience.  4. Encounter to establish care Follow up in 2 weeks for BP evaluation and CPE  We reviewed the PMH, PSH, FH, SH, Meds and Allergies. -We provided refills for any medications we will prescribe as needed. -We addressed current concerns per orders and patient instructions. -We have asked for records for pertinent exams, studies, vaccines and notes from previous providers. -We have advised patient to follow up per  instructions below.   -Patient advised to return or notify a provider immediately if  new concerns arise.  Delano Metz, FNP-C

## 2016-08-24 NOTE — Progress Notes (Signed)
Pre visit review using our clinic review tool, if applicable. No additional management support is needed unless otherwise documented below in the visit note. 

## 2016-08-24 NOTE — Patient Instructions (Signed)
It was a pleasure meeting you today! Please schedule lab visit for blood work. Also, lisinopril and atorvastatin have been provided for you today. Please monitor your blood pressure and follow up for evaluation in 2 weeks which can be combined with your physical.  Minimal Blood Pressure Goal= AVERAGE < 140/90; Ideal is an AVERAGE < 135/85. This AVERAGE should be calculated from @ least 5-7 BP readings taken @ different times of day on different days of week. You should not respond to isolated BP readings , but rather the AVERAGE for that week .Please bring your blood pressure cuff to office visits to verify that it is reliable.It can also be checked against the blood pressure device at the pharmacy. Finger or wrist cuffs are not dependable; an arm cuff is.  Please consider reducing your salt intake and the DASH diet is a great source of where you can start!  DASH Eating Plan DASH stands for "Dietary Approaches to Stop Hypertension." The DASH eating plan is a healthy eating plan that has been shown to reduce high blood pressure (hypertension). Additional health benefits may include reducing the risk of type 2 diabetes mellitus, heart disease, and stroke. The DASH eating plan may also help with weight loss. WHAT DO I NEED TO KNOW ABOUT THE DASH EATING PLAN? For the DASH eating plan, you will follow these general guidelines:  Choose foods with a percent daily value for sodium of less than 5% (as listed on the food label).  Use salt-free seasonings or herbs instead of table salt or sea salt.  Check with your health care provider or pharmacist before using salt substitutes.  Eat lower-sodium products, often labeled as "lower sodium" or "no salt added."  Eat fresh foods.  Eat more vegetables, fruits, and low-fat dairy products.  Choose whole grains. Look for the word "whole" as the first word in the ingredient list.  Choose fish and skinless chicken or [place] more often than red meat. Limit  fish, poultry, and meat to 6 oz (170 g) each day.  Limit sweets, desserts, sugars, and sugary drinks.  Choose heart-healthy fats.  Limit cheese to 1 oz (28 g) per day.  Eat more home-cooked food and less restaurant, buffet, and fast food.  Limit fried foods.  Cook foods using methods other than frying.  Limit canned vegetables. If you do use them, rinse them well to decrease the sodium.  When eating at a restaurant, ask that your food be prepared with less salt, or no salt if possible. WHAT FOODS CAN I EAT? Seek help from a dietitian for individual calorie needs. Grains Whole grain or whole wheat bread. Brown rice. Whole grain or whole wheat pasta. Quinoa, bulgur, and whole grain cereals. Low-sodium cereals. Corn or whole wheat flour tortillas. Whole grain cornbread. Whole grain crackers. Low-sodium crackers. Vegetables Fresh or frozen vegetables (raw, steamed, roasted, or grilled). Low-sodium or reduced-sodium tomato and vegetable juices. Low-sodium or reduced-sodium tomato sauce and paste. Low-sodium or reduced-sodium canned vegetables.  Fruits All fresh, canned (in natural juice), or frozen fruits. Meat and Other Protein Products Ground beef (85% or leaner), grass-fed beef, or beef trimmed of fat. Skinless chicken or [place]. Ground chicken or [place]. Pork trimmed of fat. All fish and seafood. Eggs. Dried beans, peas, or lentils. Unsalted nuts and seeds. Unsalted canned beans. Dairy Low-fat dairy products, such as skim or 1% milk, 2% or reduced-fat cheeses, low-fat ricotta or cottage cheese, or plain low-fat yogurt. Low-sodium or reduced-sodium cheeses. Fats and Oils Tub margarines  without trans fats. Light or reduced-fat mayonnaise and salad dressings (reduced sodium). Avocado. Safflower, olive, or canola oils. Natural peanut or almond butter. Other Unsalted popcorn and pretzels. The items listed above may not be a complete list of recommended foods or beverages. Contact your  dietitian for more options. WHAT FOODS ARE NOT RECOMMENDED? Grains White bread. White pasta. White rice. Refined cornbread. Bagels and croissants. Crackers that contain trans fat. Vegetables Creamed or fried vegetables. Vegetables in a cheese sauce. Regular canned vegetables. Regular canned tomato sauce and paste. Regular tomato and vegetable juices. Fruits Dried fruits. Canned fruit in light or heavy syrup. Fruit juice. Meat and Other Protein Products Fatty cuts of meat. Ribs, chicken wings, bacon, sausage, bologna, salami, chitterlings, fatback, hot dogs, bratwurst, and packaged luncheon meats. Salted nuts and seeds. Canned beans with salt. Dairy Whole or 2% milk, cream, half-and-half, and cream cheese. Whole-fat or sweetened yogurt. Full-fat cheeses or blue cheese. Nondairy creamers and whipped toppings. Processed cheese, cheese spreads, or cheese curds. Condiments Onion and garlic salt, seasoned salt, table salt, and sea salt. Canned and packaged gravies. Worcestershire sauce. Tartar sauce. Barbecue sauce. Teriyaki sauce. Soy sauce, including reduced sodium. Steak sauce. Fish sauce. Oyster sauce. Cocktail sauce. Horseradish. Ketchup and mustard. Meat flavorings and tenderizers. Bouillon cubes. Hot sauce. Tabasco sauce. Marinades. Taco seasonings. Relishes. Fats and Oils Butter, stick margarine, lard, shortening, ghee, and bacon fat. Coconut, palm kernel, or palm oils. Regular salad dressings. Other Pickles and olives. Salted popcorn and pretzels. The items listed above may not be a complete list of foods and beverages to avoid. Contact your dietitian for more information. WHERE CAN I FIND MORE INFORMATION? National Heart, Lung, and Blood Institute: travelstabloid.com   This information is not intended to replace advice given to you by your health care provider. Make sure you discuss any questions you have with your health care provider.   Document  Released: 10/05/2011 Document Revised: 11/06/2014 Document Reviewed: 08/20/2013 Elsevier Interactive Patient Education [employer].

## 2016-08-25 ENCOUNTER — Other Ambulatory Visit (INDEPENDENT_AMBULATORY_CARE_PROVIDER_SITE_OTHER): Payer: 59

## 2016-08-25 DIAGNOSIS — I10 Essential (primary) hypertension: Secondary | ICD-10-CM

## 2016-08-25 DIAGNOSIS — E785 Hyperlipidemia, unspecified: Secondary | ICD-10-CM | POA: Diagnosis not present

## 2016-08-25 LAB — LIPID PANEL
Cholesterol: 214 mg/dL — ABNORMAL HIGH (ref 0–200)
HDL: 54.3 mg/dL (ref >39.00)
LDL CALC: 141 mg/dL — AB (ref 0–99)
NONHDL: 159.22
Total CHOL/HDL Ratio: 4
Triglycerides: 90 mg/dL (ref 0.0–149.0)
VLDL: 18 mg/dL (ref 0.0–40.0)

## 2016-08-25 LAB — HEPATIC FUNCTION PANEL
ALBUMIN: 3.8 g/dL (ref 3.5–5.2)
ALT: 15 U/L (ref 0–35)
AST: 19 U/L (ref 0–37)
Alkaline Phosphatase: 75 U/L (ref 39–117)
Bilirubin, Direct: 0.1 mg/dL (ref 0.0–0.3)
Total Bilirubin: 0.4 mg/dL (ref 0.2–1.2)
Total Protein: 6.8 g/dL (ref 6.0–8.3)

## 2016-08-25 LAB — CBC WITH DIFFERENTIAL/PLATELET
Basophils Absolute: 0 10*3/uL (ref 0.0–0.1)
Basophils Relative: 0.4 % (ref 0.0–3.0)
EOS PCT: 1.4 % (ref 0.0–5.0)
Eosinophils Absolute: 0.1 10*3/uL (ref 0.0–0.7)
HEMATOCRIT: 42.1 % (ref 36.0–46.0)
HEMOGLOBIN: 14 g/dL (ref 12.0–15.0)
LYMPHS ABS: 1.8 10*3/uL (ref 0.7–4.0)
Lymphocytes Relative: 23.6 % (ref 12.0–46.0)
MCHC: 33.2 g/dL (ref 30.0–36.0)
MCV: 81.8 fl (ref 78.0–100.0)
MONOS PCT: 4.4 % (ref 3.0–12.0)
Monocytes Absolute: 0.3 10*3/uL (ref 0.1–1.0)
Neutro Abs: 5.5 10*3/uL (ref 1.4–7.7)
Neutrophils Relative %: 70.2 % (ref 43.0–77.0)
Platelets: 329 10*3/uL (ref 150.0–400.0)
RBC: 5.15 Mil/uL — AB (ref 3.87–5.11)
RDW: 14.6 % (ref 11.5–15.5)
WBC: 7.8 10*3/uL (ref 4.0–10.5)

## 2016-08-25 LAB — BASIC METABOLIC PANEL
BUN: 9 mg/dL (ref 6–23)
CALCIUM: 9.3 mg/dL (ref 8.4–10.5)
CO2: 28 meq/L (ref 19–32)
Chloride: 107 mEq/L (ref 96–112)
Creatinine, Ser: 0.62 mg/dL (ref 0.40–1.20)
GFR: 134.56 mL/min (ref >60.00)
Glucose, Bld: 91 mg/dL (ref 70–99)
Potassium: 3.9 mEq/L (ref 3.5–5.1)
Sodium: 140 mEq/L (ref 135–145)

## 2016-08-25 LAB — HEMOGLOBIN A1C: HEMOGLOBIN A1C: 5.5 % (ref 4.6–6.5)

## 2016-08-28 LAB — TSH: TSH: 0.36 u[IU]/mL (ref 0.35–4.50)

## 2016-09-07 ENCOUNTER — Encounter: Payer: Self-pay | Admitting: Family Medicine

## 2016-09-07 ENCOUNTER — Ambulatory Visit (INDEPENDENT_AMBULATORY_CARE_PROVIDER_SITE_OTHER): Payer: 59 | Admitting: Family Medicine

## 2016-09-07 VITALS — BP 118/84 | HR 75 | Temp 97.9°F | Ht 64.0 in | Wt 243.0 lb

## 2016-09-07 DIAGNOSIS — Z0001 Encounter for general adult medical examination with abnormal findings: Secondary | ICD-10-CM | POA: Diagnosis not present

## 2016-09-07 DIAGNOSIS — E785 Hyperlipidemia, unspecified: Secondary | ICD-10-CM

## 2016-09-07 DIAGNOSIS — R252 Cramp and spasm: Secondary | ICD-10-CM

## 2016-09-07 DIAGNOSIS — I10 Essential (primary) hypertension: Secondary | ICD-10-CM

## 2016-09-07 NOTE — Progress Notes (Addendum)
Subjective:    Patient ID: Sharon Fowler, female    DOB: Mar 18, 1971, 45 y.o.   MRN: [id number]  HPI  [name] is a 45 year old female who presents for routine physical examination and follow up for blood pressure and recent change in lisinopril and atorvastatin.    Hyperlipidemia  Patient is currently maintained on the following medication for hyperlipidemia: Atorvastatin 10 mg daily Last lipid panel as follows:  Lab Results  Component Value Date   CHOL 214 (H) 08/25/2016   HDL 54.30 08/25/2016   LDLCALC 141 (H) 08/25/2016   LDLDIRECT 197.8 10/15/2013   TRIG 90.0 08/25/2016   CHOLHDL 4 08/25/2016   Patient denies myalgia.  She is positive for intermittent bilateral leg cramps that last a few seconds and resolve with stretching.    Hypertension  Patient is currently maintained on the following medications for blood pressure: lisinopril 10 mg daily.  She reports ordering an arm BP monitor that she just received and will begin monitoring her BP.  Patient reports good compliance with blood pressure medications. Patient denies chest pain, shortness of breath or swelling. Last 3 blood pressure readings in our office are as follows: BP Readings from Last 3 Encounters:  09/07/16 118/84  08/24/16 (!) 124/98  06/29/15 104/78    Leg cramps have been present intermittently since 2015.  She reports drinking "very little water" and notes that she has not increased her water intake when she has a cramp.  She reports no increase in leg cramp with atorvastatin and denies myalgias.  She reports that that this occurs approximately 2x/week and can occur in the evening. No chest pain, palpitations, dyspnea, numbness, tingling, headaches, or weakness. Electrolytes are WNL. She reports initiating an oral B supplement this week.  She denies increasing water intake and tonic water.  Electrolytes with BMP normal. Will consider magnesium and multivitamin B complex  Review of  Systems  Constitutional: No fever, chills, significant weight change, fatigue, weakness or night sweats Eyes: No redness, discharge, pain, blurred vision, double vision, or loss of vision ENT/mouth: No nasal congestion, postnasal drainage,epistaxis, purulent discharge, earache, hearing loss, tinnitus ,sore throat , dental pain, or hoarseness   Cardiovascular: no chest pain, palpitations, racing, irregular rhythm, syncope, nausea, sweating, claudication, or edema  Respiratory: No cough, sputum production,hemoptysis,  dyspnea, paroxysmal nocturnal dyspnea, pleuritic chest pain, significant snoring, or  apnea    Gastrointestinal: No heartburn,dysphagia, nausea and vomiting,abdominal pain, change in bowels, anorexia, diarrhea, significant constipation, rectal bleeding, melena,  stool incontinence or jaundice Genitourinary: No dysuria,hematuria, pyuria, frequency, urgency,  incontinence, nocturia, dark urine or flank pain Musculoskeletal: No myalgias, joint stiffness, joint swelling, joint color change, weakness, or cyanosis. + legs cramps intermittently Dermatologic: No rash, pruritus, urticaria, or change in color or temperature of skin Neurologic: No headache, vertigo, limb weakness, tremor, gait disturbance, seizures, memory loss, numbness or tingling Psychiatric: No significant anxiety or depression, anhedonia, panic attacks, insomnia, or anorexia Endocrine: No change in hair/skin/ nails, excessive thirst, excessive hunger, excessive urination, or unexplained fatigue Hematologic/lymphatic: No bruising, lymphadenopathy,or  abnormal clotting Allergy/immunology: No itchy/ watery eyes, abnormal sneezing, rhinitis, urticaria ,or angioedema     Past Medical History:  Diagnosis Date  . ANEMIA, IRON DEFICIENCY UNSPEC 06/11/2009   corrected with iron replacement---menstrual reelated  . GOITER, MULTINODULAR 07/15/2009  . HYPERLIPIDEMIA 03/25/2008  . HYPERTENSION 03/25/2008  . OBESITY 03/25/2008      Social History   Social History  . Marital status: Married    Spouse  name: N/A  . Number of children: N/A  . Years of education: N/A   Occupational History  . Not on file.   Social History Main Topics  . Smoking status: Former Smoker    Quit date: 01/02/1991  . Smokeless tobacco: Never Used  . Alcohol use 0.0 oz/week     Comment: occ  . Drug use: No  . Sexual activity: Not on file   Other Topics Concern  . 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    She likes to sleep when she is not at work.     Past Surgical History:  Procedure Laterality Date  . CESAREAN SECTION  7/91, 5/95   x2  . CHOLECYSTECTOMY  10/91   laparascopy  . LAPAROSCOPIC GASTRIC BANDING  2011  . TUBAL LIGATION     01/2003    Family History  Problem Relation Age of Onset  . Diabetes Mother   . Stroke Mother   . Alcohol abuse Father   . Stroke Father     Allergies  Allergen Reactions  . Chlorthalidone     REACTION: hypokalemia (2.8)    Current Outpatient Prescriptions on File Prior to Visit  Medication Sig Dispense Refill  . atorvastatin (LIPITOR) 10 MG tablet Take 1 tablet (10 mg total) by mouth daily. 90 tablet 3  . lisinopril (PRINIVIL,ZESTRIL) 10 MG tablet Take 1 tablet (10 mg total) by mouth daily. 90 tablet 3   No current facility-administered medications on file prior to visit.     BP 118/84 (BP Location: Left Arm, Patient Position: Sitting, Cuff Size: Large)   Pulse 75   Temp 97.9 F (36.6 C) (Oral)   Ht 5' 4"  (1.626 m)   Wt 243 lb (110.2 kg)   LMP 05/08/2011 (Approximate)   SpO2 98%   BMI 41.71 kg/m      Objective:   Physical Exam Physical Exam  Constitutional: She is oriented to person, place, and time. She is morbidly obese and appears in no distress  HENT:  Head: Normocephalic and atraumatic.  Right Ear: Tympanic membrane and ear canal normal.  Left Ear: Tympanic  membrane and ear canal normal.  Mouth/Throat: Oropharynx is clear and moist.  Eyes: Pupils are equal, round, and reactive to light. No scleral icterus.  Neck: Normal range of motion.  She has a goiter Cardiovascular: Normal rate and regular rhythm.   No murmur heard. Pulmonary/Chest: Effort normal and breath sounds normal. No respiratory distress. He has no wheezes. She has no rales. She exhibits no tenderness.  Abdominal: Soft. Bowel sounds are normal. She exhibits no distension and no mass. There is no tenderness. There is no rebound and no guarding.  Musculoskeletal: She exhibits no edema.  Lymphadenopathy:    She has no cervical adenopathy.  Neurological: She is alert and oriented to person, place, and time. She has normal patellar reflexes. She exhibits normal muscle tone. Coordination normal.  Skin: Skin is warm and dry. No unusual changes in color or abnormal nevi noted Psychiatric: She has a normal mood and affect. Her behavior is normal. Judgment and thought content normal.  Breasts and Pelvic exam: Followed by gynecology and is UTD      Assessment & Plan:  1. Encounter for routine adult health examination with abnormal findings 45 y.o. female presenting for annual physical.  Health Maintenance counseling: 1.  Anticipatory guidance: Patient counseled regarding regular dental exams, eye exams, wearing seatbelts.  2. Risk factor reduction:  Advised patient of need for regular exercise and diet rich and fruits and vegetables to reduce risk of heart attack and stroke.  3. Immunizations/screenings/ancillary studies Immunization History  Administered Date(s) Administered  . Influenza-Unspecified 07/30/2013, 07/10/2016  . PPD Test 02/18/2016  . Pneumococcal-Unspecified 07/18/2015  . Td 10/31/2003  . Tdap 02/06/2014   Health Maintenance Due  Topic Date Due  . HIV Screening  10/11/1987  . MAMMOGRAM  07/21/2016  . PAP SMEAR  07/21/2016   4. Cervical cancer screening- Followed by  gynecology; patient reports normal finding 5. Breast cancer screening-  Followed by gynecology. Mammogram:  Followed by gynecology; patient reports normal finding 6. Colon cancer screening - Not needed at this time 7. Skin cancer screening- Visual examination of skin did not find any abnormal nevi. Advised patient to use sunscreen and monitor any changes in skin or nevi. Further advised that taking a photo of any suspicious nevi that may occur and follow up for further evaluation.   2. Essential hypertension Controlled; continue lisinopril daily  3. Hyperlipidemia, unspecified hyperlipidemia type Continue atorvastatin. Advised heart healthy diet with decrease in fast food intake.   4. Morbidly obese ([place]) Discussed in detail methods to improve diet and salt intake. Meal options to have on hand with avoidance of fast food options stressed. Patient is in agreement for dietary changes.  5. Bilateral leg cramps Intermittent leg cramps that have been present since 2015. Patient reports limited water intake and she states that cramps occurred in the past when she was not taking atorvastatin. She denies myalgias today. BMP is WNL. Advised increased water and consideration of tonic water. If symptoms do not improve with increased water intake will consider Mg.  Further advised her to continue B complex.  Recommended one year follow up or sooner if needed if leg cramps continue with increase in water intake.  Delano Metz, FNP-C

## 2016-09-07 NOTE — Progress Notes (Signed)
Pre visit review using our clinic review tool, if applicable. No additional management support is needed unless otherwise documented below in the visit note. 

## 2016-09-07 NOTE — Patient Instructions (Signed)
It was a pleasure to see you today! Please increase your water intake so that your urine is pale yellow or clear. Also, continue to make dietary changes to decrease fatty foods and increase lean meats and vegetables.  See information below regarding leg cramps. If symptoms do not improve with increased water intake and stretching, follow up for further evaluation and treatment.  Leg Cramps Leg cramps occur when a muscle or muscles tighten and you have no control over this tightening (involuntary muscle contraction). Muscle cramps can develop in any muscle, but the most common place is in the calf muscles of the leg. Those cramps can occur during exercise or when you are at rest. Leg cramps are painful, and they may last for a few seconds to a few minutes. Cramps may return several times before they finally stop. Usually, leg cramps are not caused by a serious medical problem. In many cases, the cause is not known. Some common causes include:  Overexertion.  Overuse from repetitive motions, or doing the same thing over and over.  Remaining in a certain position for a long period of time.  Improper preparation, form, or technique while performing a sport or an activity.  Dehydration.  Injury.  Side effects of some medicines.  Abnormally low levels of the salts and ions in your blood (electrolytes), especially potassium and calcium. These levels could be low if you are taking water pills (diuretics) or if you are pregnant. HOME CARE INSTRUCTIONS Watch your condition for any changes. Taking the following actions may help to lessen any discomfort that you are feeling:  Stay well-hydrated. Drink enough fluid to keep your urine clear or pale yellow.  Try massaging, stretching, and relaxing the affected muscle. Do this for several minutes at a time.  For tight or tense muscles, use a warm towel, heating pad, or hot shower water directed to the affected area.  If you are sore or have pain  after a cramp, applying ice to the affected area may relieve discomfort.  Put ice in a plastic bag.  Place a towel between your skin and the bag.  Leave the ice on for 20 minutes, 2-3 times per day.  Avoid strenuous exercise for several days if you have been having frequent leg cramps.  Make sure that your diet includes the essential minerals for your muscles to work normally.  Take medicines only as directed by your health care provider. SEEK MEDICAL CARE IF:  Your leg cramps get more severe or more frequent, or they do not improve over time.  Your foot becomes cold, numb, or blue.   This information is not intended to replace advice given to you by your health care provider. Make sure you discuss any questions you have with your health care provider.   Document Released: 11/23/2004 Document Revised: 03/02/2015 Document Reviewed: 09/23/2014 Elsevier Interactive Patient Education [employer].

## 2016-10-12 DIAGNOSIS — R300 Dysuria: Secondary | ICD-10-CM | POA: Diagnosis not present

## 2016-10-12 DIAGNOSIS — N760 Acute vaginitis: Secondary | ICD-10-CM | POA: Diagnosis not present

## 2016-10-20 MED FILL — NITROFURANTOIN MONO-MCR 100: 100 | 7 days supply | Qty: 14 | Fill #0

## 2016-11-23 DIAGNOSIS — Z9884 Bariatric surgery status: Secondary | ICD-10-CM | POA: Diagnosis not present

## 2016-11-23 DIAGNOSIS — R1012 Left upper quadrant pain: Secondary | ICD-10-CM | POA: Diagnosis not present

## 2016-12-14 ENCOUNTER — Encounter: Payer: 59 | Attending: General Surgery | Admitting: Skilled Nursing Facility1

## 2016-12-14 ENCOUNTER — Encounter: Payer: Self-pay | Admitting: Skilled Nursing Facility1

## 2016-12-14 DIAGNOSIS — Z713 Dietary counseling and surveillance: Secondary | ICD-10-CM | POA: Insufficient documentation

## 2016-12-14 DIAGNOSIS — E669 Obesity, unspecified: Secondary | ICD-10-CM

## 2016-12-14 MED FILL — KETOCONAZOLE 2% CREAM: 2 | 30 days supply | Qty: 60 | Fill #0

## 2016-12-14 NOTE — Patient Instructions (Addendum)
-  use fruit and vegetables and protein powder and peanut butter butter powder and unsweet almond milk in your smoothie  -Try to eat 3 meals every day   -Take intentional 30 minute breaks for meals at work  -Take 3 meals and 1 snack to work with you everyday   -Work on sticking with one serving of peanuts/peanutbutter a day not including your smoothie   -Work on finding something else to help you cope with stress other than food:   -Try ranch packet in greek yogurt   -stick with 30 minute meals   -Try to get up every 90 minutes at work

## 2016-12-14 NOTE — Progress Notes (Signed)
Follow-up visit:  4.0+ Years Post-Operative LAGB Surgery  Medical Nutrition Therapy:  Appt start time:  539   End time: 820 .  Primary concerns today:  Post-operative bariatric surgery nutrition management.  Pt states her mother passed away. Pt states she is Back on lisinpril and lipitor. Pt states she saw her surgeon 2 weeks ago and gained 85 % wt that was lost back:  From 288-188 now 242 from 250 pounds 2 weeks ago. Pt states she Want to get back on track. Pt states she has been on a liquid diet (just water). Pt states her lap band was just filled about 2 weeks ago. Pt states she  Loves peanut butter, fried chicken, ice cream, and nuts, fried chicken tenders. 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 Pt states there is no time for exercise but she is trying to use the stairs more often. Pt states it is A struggle to drink more water, limited sweet tea and cool aid. Pt states she has had some issues with burping as she eats /after eating. Pt states she [place]. Pt states she has occasional muscle spasms near her band.   Discuss supplements for next appointment.  Surgery date: 10/04/10 Start weight at [facility]: 295.6 lbs Weight today: 242  Weight change: +31 lb  BMI: 41.64  Weight goal: 170 lbs   TANITA  BODY COMP RESULTS  04/09/12  07/15/12 01/30/13 08/12/13 02/16/14 08/21/14 10/21/14 12/15/14 02/22/15 05/25/15   Fat Mass (lbs) 94.5 94.0 102.5 93.0 87.5 99.5 108.5 105.5 109.0 106.0   Fat Free Mass (lbs) 109.5 103.5 108.0 105.5 106.5 107.0 114.0 114.5 108.0 105.5   Total Body Water (lbs) 80.0 76.0 79.0 77.0 78.0 78.5 83.5 84.0 78.0 77.0   24-hr recall:    B (7 AM): protein shake with 2% milk or almond milk and fruit----water Snk (12-1 PM)  L ((PM): soup or salad---water D (8-9 PM): none Snk(11 PM):   none Snk (3-4 AM): none  Fluid intake: sparkling water/flavored water 60 ounces Estimated total protein intake: ~31-38 g  Medications: See  List Supplementation:  Refuses to take vitamins and calcium b12 and multi when you remember    Using straws: No Drinking while eating:  Tries to wait 30 minutes to drink after meals Hair loss: NO Carbonated beverages: about 1 x moth N/V/D/C: NO, NO, NO, YES: due to not eating  Last Lap-Band fill: 2 weeks ago  Recent physical activity:  uses stairs when possible  Progress Towards Goal(s):  In progress.   Nutritional Diagnosis:   [place]-3.3 Overweight/obesity related to past poor dietary habits and physical inactivity as evidenced by patient w/ recent LAGB surgery following dietary guidelines for continued weight loss.  Intervention:  Nutrition education/reinforcement.  Goals:  -use fruit and vegetables and protein powder and peanut butter butter powder and unsweet almond milk in your smoothie -Try to eat 3 meals every day  -Take intentional 30 minute breaks for meals at work -Take 3 meals and 1 snack to work with you everyday  -Work on sticking with one serving of peanuts/peanutbutter a day not including your smoothie  -Work on finding something else to help you cope with stress other than food:  -Try ranch packet in greek yogurt  -stick with 30 minute meals   -Try to get up every 90 minutes at work Monitoring/Evaluation:  Dietary intake, exercise, lap band fills, and body weight. Follow up in 6 months

## 2017-03-15 ENCOUNTER — Ambulatory Visit: Payer: 59 | Admitting: Skilled Nursing Facility1

## 2017-03-20 MED FILL — metroNIDAZOLE 500 MG TABS: 500 | 7 days supply | Qty: 14 | Fill #0

## 2017-04-05 ENCOUNTER — Ambulatory Visit: Payer: 59 | Admitting: Skilled Nursing Facility1

## 2017-04-05 ENCOUNTER — Encounter: Payer: Self-pay | Admitting: Skilled Nursing Facility1

## 2017-04-05 ENCOUNTER — Encounter: Payer: 59 | Attending: General Surgery | Admitting: Skilled Nursing Facility1

## 2017-04-05 DIAGNOSIS — Z713 Dietary counseling and surveillance: Secondary | ICD-10-CM | POA: Diagnosis not present

## 2017-04-05 DIAGNOSIS — E669 Obesity, unspecified: Secondary | ICD-10-CM | POA: Diagnosis not present

## 2017-04-05 DIAGNOSIS — Z6841 Body Mass Index (BMI) 40.0 and over, adult: Secondary | ICD-10-CM | POA: Insufficient documentation

## 2017-04-05 DIAGNOSIS — Z9884 Bariatric surgery status: Secondary | ICD-10-CM | POA: Insufficient documentation

## 2017-04-05 NOTE — Patient Instructions (Addendum)
-  Aim for 100% fruit juice if you are going to drink juice  -Refer to your yellow card to make your meals   -Prepare your meals for work: wake up earlier or do it on a day off  -Listen to your body for fullness and satisfaction   -Be sure to chew your food well

## 2017-04-05 NOTE — Progress Notes (Signed)
Follow-up visit:  4.0+ Years Post-Operative LAGB Surgery  Medical Nutrition Therapy:  Appt start time:  038   End time: 820 .  Primary concerns today:  Post-operative bariatric surgery nutrition management.  Arrives having lost 8lbs. Pt states she has not worked on anything discussed in the last appointment. Pt states 6 months ago she was 251 pounds at Dr. Sharee Pimple office. Pt states she eats fried foods, ice cream, candies, cookies again. Pt states she is training with a friend that has had the sleeve. Pt states she is cutting out fast food, soda, increase water-not getting 64 but trying, drinking cranberry juice. Pt states she needs ideas for foods to eat because she has never eaten at home before. Pt states she is having trouble getting some foods down: bread. Pt states she Stays away from starch except cakes cookies pies.  Surgery date: 10/04/10 Start weight at [facility]: 295.6 lbs Weight today: 234.4.8 Weight loss: 8 pounds   BMI: 40.22  Weight goal: 170 lbs   TANITA  BODY COMP RESULTS  04/09/12  07/15/12 01/30/13 08/12/13 02/16/14 08/21/14 10/21/14 12/15/14 02/22/15 05/25/15   Fat Mass (lbs) 94.5 94.0 102.5 93.0 87.5 99.5 108.5 105.5 109.0 106.0   Fat Free Mass (lbs) 109.5 103.5 108.0 105.5 106.5 107.0 114.0 114.5 108.0 105.5   Total Body Water (lbs) 80.0 76.0 79.0 77.0 78.0 78.5 83.5 84.0 78.0 77.0   24-hr recall:    B (7 AM): protein shake with 2% milk or almond milk and fruit----water Snk (12-1 PM)  L ((PM): soup or salad---water D (8-9 PM): none Snk(11 PM):   none Snk (3-4 AM): none  Fluid intake: sparkling water/flavored water 60 ounces, cranberry juice Estimated total protein intake: ~31-38 g  Medications: See List Supplementation:  Refuses to take vitamins and calcium b12 and multi when you remember    Using straws: No Drinking while eating:  Tries to wait 30 minutes to drink after meals Hair loss: NO Carbonated beverages: about 1 x moth N/V/D/C: NO, NO, NO, YES: due to not  eating  Last Lap-Band fill: 2 weeks ago  Recent physical activity:  uses stairs when possible  Progress Towards Goal(s):  In progress.   Nutritional Diagnosis:   [place]-3.3 Overweight/obesity related to past poor dietary habits and physical inactivity as evidenced by patient w/ recent LAGB surgery following dietary guidelines for continued weight loss.  Intervention:  Nutrition education/reinforcement.  Goals:  -Aim for 100% fruit juice if you are going to drink juice -Refer to your yellow card to make your meals  -Prepare your meals for work: wake up earlier or do it on a day off -Listen to your body for fullness and satisfaction  -Be sure to chew your food well Monitoring/Evaluation:  Dietary intake, exercise, lap band fills, and body weight.

## 2017-05-03 ENCOUNTER — Encounter: Payer: 59 | Attending: General Surgery | Admitting: Skilled Nursing Facility1

## 2017-05-03 ENCOUNTER — Encounter: Payer: Self-pay | Admitting: Skilled Nursing Facility1

## 2017-05-03 DIAGNOSIS — Z9884 Bariatric surgery status: Secondary | ICD-10-CM | POA: Insufficient documentation

## 2017-05-03 DIAGNOSIS — Z713 Dietary counseling and surveillance: Secondary | ICD-10-CM | POA: Diagnosis not present

## 2017-05-03 DIAGNOSIS — E669 Obesity, unspecified: Secondary | ICD-10-CM | POA: Insufficient documentation

## 2017-05-03 DIAGNOSIS — Z6841 Body Mass Index (BMI) 40.0 and over, adult: Secondary | ICD-10-CM | POA: Insufficient documentation

## 2017-05-03 NOTE — Patient Instructions (Addendum)
-  Stay working out  -Keep avoiding fast food   -Only eat sitting up at the table  Barriers:   -The comfort of the bed    -The habit of it  Strategy:   -To get the whole family involved and everyone eat at the table as a family, having a good conversation  Motivator:   -No regurgitation   -Better quality of life   -Better relationship with the kids    -Take bites the size a dime or size of a thumb nail

## 2017-05-03 NOTE — Progress Notes (Signed)
LAGB Surgery  Medical Nutrition Therapy:  Appt start time:  350   End time: 820 .  Primary concerns today:  Post-operative bariatric surgery nutrition management.  Pt states her water intake has gotten better but not at 64 fluid ounces. Pt states she has only eaten out 3 times since the last appointment. Pt states she still struggles with meal prepping and not eating at a set time. Pt states she does not stop eating after 30 minutes because she does not want to waste it. Pt states her coworker walks with her sometimes. Pt states she has had foods coming back up: example chicken salad with crackers. Pt states she is having regurgitation at night due to laying down and eating. Pt states everyone in her family eats in their rooms and lays down while they eat. Pt states she is thinking of getting fluid taken out of her band.   Surgery date: 10/04/10 Start weight at [facility]: 295.6 lbs Weight today: 231 Weight loss: 3 pounds   BMI: 39.72  Weight goal: 170 lbs   TANITA  BODY COMP RESULTS  04/09/12  07/15/12 01/30/13 08/12/13 02/16/14 08/21/14 10/21/14 12/15/14 02/22/15 04/06/15 05/03/2017   Fat Mass (lbs) 94.5 94.0 102.5 93.0 87.5 99.5 108.5 105.5 109.0 106.0 119.2   Fat Free Mass (lbs) 109.5 103.5 108.0 105.5 106.5 107.0 114.0 114.5 108.0 105.5 112.2   Total Body Water (lbs) 80.0 76.0 79.0 77.0 78.0 78.5 83.5 84.0 78.0 77.0 82   24-hr recall:  Only eats 2 meals   B (7 AM): protein shake with 2% milk or almond milk and fruit----water----2 packs of oatmeal with butter and a scoop of peanutbutter Snk (12-1 PM) 2 cheese sticks and a fruit cup  L ((PM): none D (8-9 PM): salmon salad spinach cheese carrots cucumbers onion green peppers  Snk(11 PM):   oatmeal Snk (3-4 AM): none  Fluid intake: sparkling water/flavored water 60 ounces, cranberry juice Estimated total protein intake: ~31-38 g  Medications: See List Supplementation:  Refuses to take vitamins and calcium b12 and multi when you remember     Using straws: No Drinking while eating:  Tries to wait 30 minutes to drink after meals Hair loss: NO Carbonated beverages: about 1 x moth N/V/D/C: NO, NO, NO, YES: due to not eating  Last Lap-Band fill: 2 weeks ago  Recent physical activity:  3 days a week 3+ miles walking, 3 days a week zumba for 60 minutes  Progress Towards Goal(s):  In progress.   Nutritional Diagnosis:   [place]-3.3 Overweight/obesity related to past poor dietary habits and physical inactivity as evidenced by patient w/ recent LAGB surgery following dietary guidelines for continued weight loss.  Intervention:  Nutrition education/reinforcement.  Goals:  -Stay working out -Keep avoiding fast food   -Only eat sitting up at the table  Barriers:   -The comfort of the bed    -The habit of it  Strategy:   -To get the whole family involved and everyone eat at the table as a family, having a good conversation  Motivator:   -No regurgitation   -Better quality of life   -Better relationship with the kids -Take bites the size a dime or size of a thumb nail Monitoring/Evaluation:  Dietary intake, exercise, lap band fills, and body weight.

## 2017-05-26 ENCOUNTER — Encounter: Payer: Self-pay | Admitting: Osteopathic Medicine

## 2017-05-26 ENCOUNTER — Ambulatory Visit (INDEPENDENT_AMBULATORY_CARE_PROVIDER_SITE_OTHER): Payer: 59 | Admitting: Osteopathic Medicine

## 2017-05-26 ENCOUNTER — Ambulatory Visit (INDEPENDENT_AMBULATORY_CARE_PROVIDER_SITE_OTHER): Payer: 59

## 2017-05-26 ENCOUNTER — Telehealth: Payer: 59 | Admitting: Physician Assistant

## 2017-05-26 VITALS — BP 128/86 | HR 88 | Temp 99.2°F | Resp 17 | Ht 63.5 in | Wt 224.0 lb

## 2017-05-26 DIAGNOSIS — R1110 Vomiting, unspecified: Secondary | ICD-10-CM | POA: Diagnosis not present

## 2017-05-26 DIAGNOSIS — R809 Proteinuria, unspecified: Secondary | ICD-10-CM | POA: Insufficient documentation

## 2017-05-26 DIAGNOSIS — R319 Hematuria, unspecified: Secondary | ICD-10-CM

## 2017-05-26 DIAGNOSIS — R112 Nausea with vomiting, unspecified: Secondary | ICD-10-CM | POA: Diagnosis not present

## 2017-05-26 DIAGNOSIS — R109 Unspecified abdominal pain: Secondary | ICD-10-CM

## 2017-05-26 DIAGNOSIS — M546 Pain in thoracic spine: Secondary | ICD-10-CM | POA: Diagnosis not present

## 2017-05-26 LAB — POCT URINALYSIS DIP (MANUAL ENTRY)
Glucose, UA: NEGATIVE mg/dL
Nitrite, UA: NEGATIVE
UROBILINOGEN UA: 1 U/dL
pH, UA: 5.5 (ref 5.0–8.0)

## 2017-05-26 MED ORDER — NAPROXEN 500 MG PO TABS: 500.0000 mg | ORAL_TABLET | Freq: Two times a day (BID) | ORAL | 0 refills | Status: DC

## 2017-05-26 MED ORDER — KETOROLAC TROMETHAMINE 60 MG/2ML IM SOLN
60.0000 mg | Freq: Once | INTRAMUSCULAR | Status: AC
  Administered 2017-05-26: 60 mg via INTRAMUSCULAR

## 2017-05-26 MED ORDER — CYCLOBENZAPRINE HCL 10 MG PO TABS: 5.0000 mg | ORAL_TABLET | Freq: Three times a day (TID) | ORAL | 0 refills | Status: DC | PRN

## 2017-05-26 MED ORDER — ONDANSETRON 8 MG PO TBDP: 8.0000 mg | ORAL_TABLET | Freq: Three times a day (TID) | ORAL | 1 refills | Status: DC | PRN

## 2017-05-26 NOTE — Progress Notes (Signed)
HPI: Sharon Fowler is a 46 y.o. female  who presents to [facility] at [place] today, 05/26/17,  for chief complaint of:  Chief Complaint  Patient presents with  . Back Pain    since Tuesday & radiating around to ribs/chest area. Wants to rule out kidney infection, UTI, & pulled muscle    Onset: About 5 days ago (Tuesday), was feeling a bit better on Wednesday but then poorly again on Thursday. Today is Saturday  Location/quality: Mid back pain on the left side  Associated symptoms: Nausea. Inability to keep down much in the way of food or liquids, patient states she has had absolutely nothing to eat for the past 5 days.  Context: No known injury   She typically exercises walking about 6 miles a day which she was able to do on Tuesday, she does not recall any specific injury, lifting, twisting motion which may have exacerbated this that she did participate in [employer] classes on Monday   Patient has history of gastric banding in 2011 which she states may be contributing to her nausea and inability to keep down food or significant liquids  Concerned about possible UTI though denies fever, urinary frequency/urgency, dysuria  . Modifying factors: Ibuprofen was helping for a while but at this point nothing really seems to make it better or worse.  Patient was given a shot of Toradol and waited in the office about half an hour prior to being able to give urine specimen. Reexam at this point, patient was feeling a good bit better.    Past medical, surgical, social and family history reviewed: Patient Active Problem List   Diagnosis Date Noted  . GOITER, MULTINODULAR 07/15/2009  . ANEMIA, IRON DEFICIENCY UNSPEC 06/11/2009  . HYPERLIPIDEMIA 03/25/2008  . OBESITY 03/25/2008  . HYPERTENSION 03/25/2008   Past Surgical History:  Procedure Laterality Date  . CESAREAN SECTION  7/91, 5/95   x2  . CHOLECYSTECTOMY  10/91   laparascopy  . LAPAROSCOPIC GASTRIC BANDING  2011  .  TUBAL LIGATION     01/2003   Social History  Substance Use Topics  . Smoking status: Former Smoker    Quit date: 01/02/1991  . Smokeless tobacco: Never Used  . Alcohol use 0.0 oz/week     Comment: occ   Family History  Problem Relation Age of Onset  . Diabetes Mother   . Stroke Mother   . Alcohol abuse Father   . Stroke Father      Current medication list and allergy/intolerance information reviewed:   Current Outpatient Prescriptions  Medication Sig Dispense Refill  . Cyanocobalamin (B-12 PO) Take by mouth.    Mariane Baumgarten Calcium (STOOL SOFTENER PO) Take by mouth.    . Multiple Vitamin (MULTIVITAMIN) tablet Take 1 tablet by mouth daily.    Marland Kitchen atorvastatin (LIPITOR) 10 MG tablet Take 1 tablet (10 mg total) by mouth daily. (Patient not taking: Reported on 05/26/2017) 90 tablet 3  . lisinopril (PRINIVIL,ZESTRIL) 10 MG tablet Take 1 tablet (10 mg total) by mouth daily. (Patient not taking: Reported on 05/26/2017) 90 tablet 3   No current facility-administered medications for this visit.    Allergies  Allergen Reactions  . Chlorthalidone     REACTION: hypokalemia (2.8)      Review of Systems:  Constitutional:  No  fever, no chills, No unintentional weight changes. No significant fatigue.   HEENT: No  headache, no vision change  Cardiac: No  chest pain, No  pressure  Respiratory:  No  shortness of breath.   Gastrointestinal: +epigastric abdominal pain, +nausea, +vomiting,  No  blood in stool, No  diarrhea, No  constipation   Musculoskeletal: +new myalgia/arthralgia  Genitourinary: No  incontinence, No  abnormal genital bleeding, No abnormal genital discharge, no dysuria, no hematuria, no urinary frequency  Skin: No  Rash  Exam:  BP 128/86   Pulse 88   Temp 99.2 F (37.3 C) (Oral)   Resp 17   Ht 5' 3.5" (1.613 m)   Wt 224 lb (101.6 kg)   LMP 05/08/2011 (Approximate)   SpO2 100%   BMI 39.06 kg/m   Constitutional: VS see above. General Appearance: alert,  well-developed, well-nourished, NAD  Eyes: Normal lids and conjunctive, non-icteric sclera  Ears, Nose, Mouth, Throat: MMM, Normal external inspection ears/nares/mouth/lips/gums.  Neck: No masses, trachea midline.   Respiratory: Normal respiratory effort. no wheeze, no rhonchi, no rales  Cardiovascular: S1/S2 normal, no rub/gallop auscultated. RRR.  Gastrointestinal: Nontender, no masses. No hepatomegaly, no splenomegaly. No hernia appreciated. Bowel sounds normal. Rectal exam deferred.   Musculoskeletal: Gait normal. No clubbing/cyanosis of digits. Paraspinal tenderness left mid lumbar spine, negative Lloyd sign bilaterally  Neurological: Normal balance/coordination. No tremor.  Skin: warm, dry, intact. No rash/ulcer.   Psychiatric: Normal judgment/insight. Anxious mood and affect. Oriented x3.     Results for orders placed or performed in visit on 05/26/17 (from the past 72 hour(s))  POCT urinalysis dipstick     Status: Abnormal   Collection Time: 05/26/17  3:00 PM  Result Value Ref Range   Color, UA yellow yellow   Clarity, UA clear clear   Glucose, UA negative negative mg/dL   Bilirubin, UA moderate (A) negative   Ketones, POC UA moderate (40) (A) negative mg/dL   Spec Grav, UA >=1.030 (A) 1.010 - 1.025   Blood, UA small (A) negative   pH, UA 5.5 5.0 - 8.0   Protein Ur, POC =100 (A) negative mg/dL   Urobilinogen, UA 1.0 0.2 or 1.0 E.U./dL   Nitrite, UA Negative Negative   Leukocytes, UA Trace (A) Negative    Dg Thoracic Spine 2 View  Result Date: 05/26/2017 CLINICAL DATA:  Acute left-sided thoracic back pain. EXAM: THORACIC SPINE 2 VIEWS COMPARISON:  Radiographs of June 28, 2010. FINDINGS: There is no evidence of thoracic spine fracture or spondylolisthesis. Mild anterior osteophyte formation is noted at multiple levels of the lower thoracic spine. IMPRESSION: Mild degenerative changes are noted in the lower thoracic spine. No acute abnormality is noted.  Electronically Signed   By: Marijo Conception, M.D.   On: 05/26/2017 14:40   Dg Abd 2 Views  Result Date: 05/26/2017 CLINICAL DATA:  Vomiting, nausea. EXAM: ABDOMEN - 2 VIEW COMPARISON:  Radiographs of May 12, 2016. FINDINGS: The bowel gas pattern is normal. There is no evidence of free air. No radio-opaque calculi or other significant radiographic abnormality is seen. Status post cholecystectomy. Lap band device is unchanged in position. Surgical clips are noted in the pelvis. IMPRESSION: No evidence of bowel obstruction or ileus. Electronically Signed   By: Marijo Conception, M.D.   On: 05/26/2017 14:39    Recent pertinent labs reviewed: About 9 months ago, normal TSH, normal renal function and other basic metabolic panel, J0D 5.5  ASSESSMENT/PLAN: Most likely musculoskeletal spasm, especially given improvement with Toradol, we'll treat as such with close follow-up in the office, see below. Additional urine culture to confirm or rule out urinary tract infection the patient does not  have symptoms at this time. GI symptoms such as nausea/vomiting are more concerning for possible complication from gastric bypass surgery, flank pain could also represent nephrolithiasis, would consider EGD or abdominal CT if no improvement.   Flank pain - Plan: DG Abd 2 Views, ketorolac (TORADOL) injection 60 mg, POCT urinalysis dipstick, Urine Culture, Urinalysis, microscopic only, CANCELED: Urine Microscopic, CANCELED: POCT Microscopic Urinalysis ([facility])  Acute left-sided thoracic back pain - Plan: DG Thoracic Spine 2 View, ketorolac (TORADOL) injection 60 mg, POCT urinalysis dipstick  Non-intractable vomiting with nausea, unspecified vomiting type - Plan: DG Abd 2 Views, ondansetron (ZOFRAN-ODT) 8 MG disintegrating tablet, POCT urinalysis dipstick  Proteinuria, unspecified type - Plan: CANCELED: Urine Microscopic    Patient Instructions   Plan:  Antinausea medications  Anti-inflammatories and muscle relaxer  medications  Home exercises for back pain, consider formal physical therapy  Await further testing for urine culture  Please see your primary care provider in their office sometime early this week to recheck symptoms  If anything gets worse, please call his office or seek care in hospital/emergency department    IF you received an x-ray today, you will receive an invoice from [facility]. Please contact [facility] at [phone] with questions or concerns regarding your invoice.   IF you received labwork today, you will receive an invoice from [facility]. Please contact [facility] at [phone] with questions or concerns regarding your invoice.   Our billing staff will not be able to assist you with questions regarding bills from these companies.  You will be contacted with the lab results as soon as they are available. The fastest way to get your results is to activate your My Chart account. Instructions are located on the last page of this paperwork. If you have not heard from [place] regarding the results in 2 weeks, please contact this office.         Visit summary with medication list and pertinent instructions was printed for patient to review. All questions at time of visit were answered - patient instructed to contact office with any additional concerns. ER/RTC precautions were reviewed with the patient. Follow-up plan: Return for recheck with primary care provider in 2-3 days if no better .

## 2017-05-26 NOTE — Patient Instructions (Addendum)
Plan:  Antinausea medications  Anti-inflammatories and muscle relaxer medications  Home exercises for back pain, consider formal physical therapy  Await further testing for urine culture  Please see your primary care provider in their office sometime early this week to recheck symptoms  If anything gets worse, please call his office or seek care in hospital/emergency department    IF you received an x-ray today, you will receive an invoice from [facility]. Please contact [facility] at [phone] with questions or concerns regarding your invoice.   IF you received labwork today, you will receive an invoice from [facility]. Please contact [facility] at [phone] with questions or concerns regarding your invoice.   Our billing staff will not be able to assist you with questions regarding bills from these companies.  You will be contacted with the lab results as soon as they are available. The fastest way to get your results is to activate your My Chart account. Instructions are located on the last page of this paperwork. If you have not heard from [place] regarding the results in 2 weeks, please contact this office.

## 2017-05-26 NOTE — Progress Notes (Signed)
Based on what you shared with me it looks like you have a serious condition that should be evaluated in a face to face office visit today -- at an Urgent Care or an ER facility.  I am concerned that you either have a significant kidney infection (pyleonephritis) starting or potentially a kidney stone due to pain. Either of these require a face-to-face assessment, urine testing and potential imaging. You may require an injection of antibiotics in addition to oral medications in the case of a kidney infection. These can get serious very quickly, so please do not delay care.    NOTE: Even if you have entered your credit card information for this eVisit, you will not be charged.   If you are having a true medical emergency please call 911.  If you need an urgent face to face visit, [facility] has four urgent care centers for your convenience.  If you need care fast and have a high deductible or no insurance consider:   [email or web address]  [phone]  [address] [place], [place] [address] 8 am to 8 pm Monday-Friday 10 am to 4 pm Saturday-Sunday   The following sites will take your  insurance:    . [facility]gent [place] a Provider at this Location  [address] [place], [place] [address] . 10 am to 8 pm Monday-Friday . 12 pm to 8 pm Saturday-Sunday   . [facility] at [place] a Provider at this Location  [address], [place], [place] [address] . 8 am to 8 pm Monday-Friday . 9 am to 6 pm Saturday . 11 am to 6 pm Sunday   . [facility] at [place] Get Driving Directions  [address] [place], [place] [address] . 8 am to 8 pm Monday-Friday . 8 am to 4 pm Saturday-Sunday   Your e-visit answers were reviewed by a board certified advanced clinical practitioner to  complete your personal care plan.  Thank you for using e-Visits.

## 2017-05-27 LAB — URINALYSIS, MICROSCOPIC ONLY
BACTERIA UA: NONE SEEN
Casts: NONE SEEN /lpf
Epithelial Cells (non renal): NONE SEEN /hpf (ref 0–10)
RBC, UA: NONE SEEN /hpf (ref 0–?)
WBC, UA: NONE SEEN /hpf (ref 0–?)

## 2017-05-27 LAB — URINE CULTURE

## 2017-06-10 DIAGNOSIS — H524 Presbyopia: Secondary | ICD-10-CM | POA: Diagnosis not present

## 2017-06-27 ENCOUNTER — Telehealth: Payer: Self-pay | Admitting: Adult Health

## 2017-06-27 NOTE — Telephone Encounter (Signed)
The patient dropped off [employer] forms  Fax forms to: [phone]  ATTN: Stephens  Call patient for confirmation that the form has been faxed.  Disposition: Dr's Folder

## 2017-06-28 NOTE — Telephone Encounter (Signed)
Please see forwarded message

## 2017-06-28 NOTE — Telephone Encounter (Signed)
Forms have been received and have been placed on [place] desk for signing on Friday.

## 2017-06-28 NOTE — Telephone Encounter (Signed)
Pt following up on forms.  Pt states she hopes we can get this done asap so she can donate asap.

## 2017-07-04 NOTE — Telephone Encounter (Signed)
Spoke with pt notified her that Gregary Signs is now working only on Fridays and that she will look at the forms on Friday and will  call her back then.

## 2017-07-05 NOTE — Telephone Encounter (Signed)
These were completed last Friday and placed on the computer at Nancy's work station.  Please check with Misty to see if she has seen these forms.

## 2017-07-09 ENCOUNTER — Encounter: Payer: 59 | Attending: General Surgery | Admitting: Skilled Nursing Facility1

## 2017-07-09 ENCOUNTER — Telehealth: Payer: Self-pay

## 2017-07-09 ENCOUNTER — Encounter: Payer: Self-pay | Admitting: Skilled Nursing Facility1

## 2017-07-09 DIAGNOSIS — Z6841 Body Mass Index (BMI) 40.0 and over, adult: Secondary | ICD-10-CM | POA: Diagnosis not present

## 2017-07-09 DIAGNOSIS — E669 Obesity, unspecified: Secondary | ICD-10-CM | POA: Diagnosis not present

## 2017-07-09 DIAGNOSIS — Z713 Dietary counseling and surveillance: Secondary | ICD-10-CM | POA: Insufficient documentation

## 2017-07-09 DIAGNOSIS — Z9884 Bariatric surgery status: Secondary | ICD-10-CM | POA: Insufficient documentation

## 2017-07-09 NOTE — Progress Notes (Signed)
LAGB Surgery  Medical Nutrition Therapy:  Appt start time:  888   End time: 820 .  Primary concerns today:  Post-operative bariatric surgery nutrition management.  Pt states since June she has been walking 3-6 miles 3 days a week and zumba 3-4 times a week but August and September there is no time for zumba due to working more. Pt states she does not know if she will continue walking due tot the season changes coming. Pt states she is trying her best to not eat fried chicken and has not eaten out in the last 2 months.  Pt states she eats ice cream to have a bowel movement. Pt states she has not had any regurgitation stating she feels the band is too tight but she is managing. Pt states she still struggles to not eat fast food and uses will power as her strategy.   Surgery date: 10/04/10 Start weight at [facility]: 295.6 lbs Weight today: 217.4 Weight loss: 14 pounds   BMI: 39.72  Weight goal: 170 lbs   TANITA  BODY COMP RESULTS  04/09/12  07/15/12 01/30/13 08/12/13 02/16/14 08/21/14 10/21/14 12/15/14 02/22/15 04/06/15 05/03/2017   Fat Mass (lbs) 94.5 94.0 102.5 93.0 87.5 99.5 108.5 105.5 109.0 106.0 119.2   Fat Free Mass (lbs) 109.5 103.5 108.0 105.5 106.5 107.0 114.0 114.5 108.0 105.5 112.2   Total Body Water (lbs) 80.0 76.0 79.0 77.0 78.0 78.5 83.5 84.0 78.0 77.0 82   24-hr recall:  Only eats 2 meals   B (7 AM): 1 pack of oatmeal with butter and peanutbutter Snk (12-1 PM) yogurt L ((PM): none D (8-9 PM): chicken or salmon Snk(11 PM):   Deli meat or string cheese Snk (3-4 AM): ice cream  Fluid intake: sparkling water/flavored water/sugar free cool aid 54 ounces Estimated total protein intake: 60 g  Medications: See List Supplementation:  Refuses to take vitamins and calcium b12 and multi when you remember    Using straws: No Drinking while eating:  Tries to wait 30 minutes to drink after meals Hair loss: NO Carbonated beverages: about 1 x moth N/V/D/C: constipated  Last Lap-Band fill:  none  Recent physical activity:  3 days a week 3+ miles walking, 3 days a week zumba for 60 minutes  Progress Towards Goal(s):  In progress.   Nutritional Diagnosis:   [place]-3.3 Overweight/obesity related to past poor dietary habits and physical inactivity as evidenced by patient w/ recent LAGB surgery following dietary guidelines for continued weight loss.  Intervention:  Nutrition education/reinforcement.  Goals:  -Keep walking; wear a coat if it gets chilly  -Look into your schedule to see where you can fit in more fluid -Keep up chewing well and taking small bites   Monitoring/Evaluation:  Dietary intake, exercise, lap band fills, and body weight.

## 2017-07-09 NOTE — Patient Instructions (Addendum)
-  Keep walking; wear a coat if it gets chilly    -Look into your schedule to see where you can fit in more fluid    -Keep up chewing well and taking small bites

## 2017-07-09 NOTE — Telephone Encounter (Signed)
Called pt and advised her to fax another set of forms for completion since we could not find the previous set that was completed. Patient stated that she will drop the forms in the morning at the office.

## 2017-07-10 NOTE — Telephone Encounter (Signed)
Completed by another provider

## 2017-07-10 NOTE — Telephone Encounter (Signed)
Pt brought the Forms this morning and were completed by [facility].

## 2017-07-12 DIAGNOSIS — L723 Sebaceous cyst: Secondary | ICD-10-CM | POA: Diagnosis not present

## 2017-07-19 ENCOUNTER — Encounter: Payer: Self-pay | Admitting: Family Medicine

## 2017-08-01 ENCOUNTER — Encounter: Payer: Self-pay | Admitting: Family Medicine

## 2017-08-01 ENCOUNTER — Ambulatory Visit (INDEPENDENT_AMBULATORY_CARE_PROVIDER_SITE_OTHER): Payer: 59 | Admitting: Family Medicine

## 2017-08-01 VITALS — BP 120/80 | HR 73 | Temp 98.3°F | Ht 63.25 in | Wt 218.1 lb

## 2017-08-01 DIAGNOSIS — Z862 Personal history of diseases of the blood and blood-forming organs and certain disorders involving the immune mechanism: Secondary | ICD-10-CM | POA: Diagnosis not present

## 2017-08-01 DIAGNOSIS — Z131 Encounter for screening for diabetes mellitus: Secondary | ICD-10-CM

## 2017-08-01 DIAGNOSIS — Z7689 Persons encountering health services in other specified circumstances: Secondary | ICD-10-CM | POA: Diagnosis not present

## 2017-08-01 DIAGNOSIS — E785 Hyperlipidemia, unspecified: Secondary | ICD-10-CM | POA: Diagnosis not present

## 2017-08-01 DIAGNOSIS — I10 Essential (primary) hypertension: Secondary | ICD-10-CM

## 2017-08-01 DIAGNOSIS — J302 Other seasonal allergic rhinitis: Secondary | ICD-10-CM

## 2017-08-01 DIAGNOSIS — Z9884 Bariatric surgery status: Secondary | ICD-10-CM | POA: Diagnosis not present

## 2017-08-01 LAB — LIPID PANEL
CHOLESTEROL: 194 mg/dL (ref 0–200)
HDL: 50.4 mg/dL (ref >39.00)
LDL CALC: 127 mg/dL — AB (ref 0–99)
NONHDL: 143.97
Total CHOL/HDL Ratio: 4
Triglycerides: 85 mg/dL (ref 0.0–149.0)
VLDL: 17 mg/dL (ref 0.0–40.0)

## 2017-08-01 LAB — CBC WITH DIFFERENTIAL/PLATELET
Basophils Absolute: 0 10*3/uL (ref 0.0–0.1)
Basophils Relative: 0.6 % (ref 0.0–3.0)
EOS PCT: 1.6 % (ref 0.0–5.0)
Eosinophils Absolute: 0.1 10*3/uL (ref 0.0–0.7)
HEMATOCRIT: 39.3 % (ref 36.0–46.0)
Hemoglobin: 12.9 g/dL (ref 12.0–15.0)
LYMPHS ABS: 1.6 10*3/uL (ref 0.7–4.0)
LYMPHS PCT: 25.3 % (ref 12.0–46.0)
MCHC: 32.8 g/dL (ref 30.0–36.0)
MCV: 82.9 fl (ref 78.0–100.0)
MONOS PCT: 4.7 % (ref 3.0–12.0)
Monocytes Absolute: 0.3 10*3/uL (ref 0.1–1.0)
NEUTROS ABS: 4.4 10*3/uL (ref 1.4–7.7)
NEUTROS PCT: 67.8 % (ref 43.0–77.0)
PLATELETS: 333 10*3/uL (ref 150.0–400.0)
RBC: 4.74 Mil/uL (ref 3.87–5.11)
RDW: 15 % (ref 11.5–15.5)
WBC: 6.5 10*3/uL (ref 4.0–10.5)

## 2017-08-01 LAB — COMPREHENSIVE METABOLIC PANEL
ALBUMIN: 3.6 g/dL (ref 3.5–5.2)
ALK PHOS: 78 U/L (ref 39–117)
ALT: 15 U/L (ref 0–35)
AST: 16 U/L (ref 0–37)
BUN: 7 mg/dL (ref 6–23)
CO2: 28 mEq/L (ref 19–32)
CREATININE: 0.52 mg/dL (ref 0.40–1.20)
Calcium: 9.4 mg/dL (ref 8.4–10.5)
Chloride: 105 mEq/L (ref 96–112)
GFR: 164.14 mL/min (ref >60.00)
GLUCOSE: 90 mg/dL (ref 70–99)
Potassium: 4.2 mEq/L (ref 3.5–5.1)
SODIUM: 139 meq/L (ref 135–145)
TOTAL PROTEIN: 6.4 g/dL (ref 6.0–8.3)
Total Bilirubin: 0.5 mg/dL (ref 0.2–1.2)

## 2017-08-01 LAB — HEMOGLOBIN A1C: Hgb A1c MFr Bld: 5.6 % (ref 4.6–6.5)

## 2017-08-01 LAB — TSH: TSH: 0.5 u[IU]/mL (ref 0.35–4.50)

## 2017-08-01 LAB — T4, FREE: FREE T4: 0.98 ng/dL (ref 0.60–1.60)

## 2017-08-01 MED ORDER — LISINOPRIL 10 MG PO TABS: 10.0000 mg | ORAL_TABLET | Freq: Every day | ORAL | 3 refills | Status: DC

## 2017-08-01 MED ORDER — ATORVASTATIN CALCIUM 10 MG PO TABS: 10.0000 mg | ORAL_TABLET | Freq: Every day | ORAL | 3 refills | Status: DC

## 2017-08-01 MED FILL — ATORVASTATIN 10 MG TABLET: 10 | 90 days supply | Qty: 90 | Fill #0

## 2017-08-01 MED FILL — LISINOPRIL 10 MG TABS: 10 | 90 days supply | Qty: 90 | Fill #0

## 2017-08-01 NOTE — Progress Notes (Signed)
Patient presents to clinic today to re-establish care and for acute issue.  SUBJECTIVE: PMH: Pt is a 46 yo AAF with pmh sig  HTN, HLD, h/o anemia.  Pt was formerly seen in the by Delano Metz, FNP.  HTN: -Patient currently on lisinopril 10 mg daily -No issues of headache, shortness of breath, blurred vision -Formerly on chlorthalidone however rapidly decreased potassium to 2.8  HLD: -Taking atorvastatin 10 mg daily -Trying to eat better -Denies muscle pain  H/o anemia: -Taking iron daily -Also taking a multivitamin and B12 -Denies dizziness, palpitations  Acute concern: -Patient has been donating plasma. Typically goes twice a week -Endorses random crackle in lungs, does not last long.  -Happened twice. -Also with dry cough. Notes when talking -Denies SOB, throat irritation -Noticed symptoms after flu shot on September 18.  Allergies: Chlorthalidone-causes hypokalemia. Noted at 2.8  Past surgical history: -Gastric banding in 2011,  Pt was 288lbs at her heaviest. C-section 2 Cholecystectomy LEEP Tubal ligation  Social history: Patient has been married 4 years but is currently separated. Patient is interested in proceeding with divorce. Patient has 5 kids ages 32, 34, 47, 39, 4. 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 Patient denies tobacco use, EtOH, drug use. LMP 7 years ago.  Family medical history: Mom-deceased, CVA, hypertension, DM, asthma Dad-CVA, DM-diet controlled MGM-DM, MI at 26 deceased MGF-? Deceased PGM-healthy at 76, Alzheimer's disease PGF-DM, heart surgery, stents, deceased 2 sisters-1 with gastric banding the other with DM however they don't speak to each other.   Past Medical History:  Diagnosis Date  . ANEMIA, IRON DEFICIENCY UNSPEC 06/11/2009   corrected with iron replacement---menstrual reelated  . GOITER, MULTINODULAR 07/15/2009  . HYPERLIPIDEMIA 03/25/2008  . HYPERTENSION 03/25/2008  . OBESITY 03/25/2008    Past  Surgical History:  Procedure Laterality Date  . CESAREAN SECTION  7/91, 5/95   x2  . CHOLECYSTECTOMY  10/91   laparascopy  . LAPAROSCOPIC GASTRIC BANDING  2011  . TUBAL LIGATION     01/2003    Current Outpatient Prescriptions on File Prior to Visit  Medication Sig Dispense Refill  . Cyanocobalamin (B-12 PO) Take by mouth.    . cyclobenzaprine (FLEXERIL) 10 MG tablet Take 0.5-1 tablets (5-10 mg total) by mouth 3 (three) times daily as needed for muscle spasms. 30 tablet 0  . Docusate Calcium (STOOL SOFTENER PO) Take by mouth.    . Multiple Vitamin (MULTIVITAMIN) tablet Take 1 tablet by mouth daily.    . naproxen (NAPROSYN) 500 MG tablet Take 1 tablet (500 mg total) by mouth 2 (two) times daily with a meal. Take daily for 1-2 weeks then can use as-needed after that 45 tablet 0  . ondansetron (ZOFRAN-ODT) 8 MG disintegrating tablet Take 1 tablet (8 mg total) by mouth every 8 (eight) hours as needed for nausea or vomiting. 20 tablet 1   No current facility-administered medications on file prior to visit.     Allergies  Allergen Reactions  . Chlorthalidone     REACTION: hypokalemia (2.8)    Family History  Problem Relation Age of Onset  . Diabetes Mother   . Stroke Mother   . Alcohol abuse Father   . Stroke Father     Social History   Social History  . Marital status: Married    Spouse name: N/A  . Number of children: N/A  . Years of education: N/A   Occupational History  . Not on file.   Social History  Main Topics  . Smoking status: Former Smoker    Quit date: 01/02/1991  . Smokeless tobacco: Never Used  . Alcohol use 0.0 oz/week     Comment: occ  . Drug use: No  . Sexual activity: Not on file   Other Topics Concern  . 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    She likes to sleep when she is not at work.     ROS General: Denies fever, chills, night sweats,  changes in weight, changes in appetite, Dizziness HEENT: Denies headaches, ear pain, changes in vision, sore throat  +rhinorrhea CV: Denies CP, palpitations, SOB, orthopnea Pulm: Denies SOB, wheezing  +dry cough GI: Denies abdominal pain, nausea, vomiting, diarrhea, constipation GU: Denies dysuria, hematuria, frequency, vaginal discharge Msk: Denies muscle cramps, joint pains Neuro: Denies weakness, numbness, tingling Skin: Denies rashes, bruising Psych: Denies depression, anxiety, hallucinations  BP 120/80 (BP Location: Right Arm, Patient Position: Sitting, Cuff Size: Large)   Pulse 73   Temp 98.3 F (36.8 C) (Oral)   Ht 5' 3.25" (1.607 m)   Wt 218 lb 1.6 oz (98.9 kg)   LMP 05/08/2011 (Approximate)   BMI 38.33 kg/m   Physical Exam Gen. Pleasant, well developed, well-nourished, in NAD HEENT - [place]/AT, PERRL, no scleral icterus, no nasal drainage, pharynx without erythema or exudate. Lungs: no use of accessory muscles, CTAB, no wheezes, rales or rhonchi Cardiovascular: RRR, No r/g/m, no peripheral edema Abdomen: BS present, soft, nontender,nondistended, no hepatosplenomegaly Musculoskeletal: No deformities, moves all four extremities, no cyanosis or clubbing, normal tone Neuro:  A&Ox3, CN II-XII intact, normal gait Skin:  Warm, dry, intact, no lesions Psych: normal affect, mood appropriate  Recent Results (from the past 2160 hour(s))  POCT urinalysis dipstick     Status: Abnormal   Collection Time: 05/26/17  3:00 PM  Result Value Ref Range   Color, UA yellow yellow   Clarity, UA clear clear   Glucose, UA negative negative mg/dL   Bilirubin, UA moderate (A) negative   Ketones, POC UA moderate (40) (A) negative mg/dL   Spec Grav, UA >=1.030 (A) 1.010 - 1.025   Blood, UA small (A) negative   pH, UA 5.5 5.0 - 8.0   Protein Ur, POC =100 (A) negative mg/dL   Urobilinogen, UA 1.0 0.2 or 1.0 E.U./dL   Nitrite, UA Negative Negative   Leukocytes, UA Trace (A) Negative  Urine  Culture     Status: None   Collection Time: 05/26/17  3:14 PM  Result Value Ref Range   Urine Culture, Routine Final report    Organism ID, Bacteria Comment     Comment: Mixed urogenital flora 10,000-25,000 colony forming units per mL   Urinalysis, microscopic only     Status: Abnormal   Collection Time: 05/26/17  3:26 PM  Result Value Ref Range   WBC, UA None seen 0 - 5 /hpf   RBC, UA None seen 0 - 2 /hpf   Epithelial Cells (non renal) None seen 0 - 10 /hpf   Casts None seen None seen /lpf   Crystals Present (A) N/A   Crystal Type Amorphous Sediment N/A   Bacteria, UA None seen None seen/Few    Assessment/Plan: Essential hypertension -continue lisinopril 10 mg  -will check labs. Will also monitor in case contributing to dry cough. -discussed lifestyle modifications  - Plan:  Comprehensive metabolic panel, lisinopril (PRINIVIL,ZESTRIL) 10 MG tablet  Seasonal allergies -Discussed possible causes for dry cough including allergies, lisinopril use -Will try daily allergy pill.   -Also discussed nasal spray use, but will start with allergy pill. -If no improvement in symptoms will d/c lisinopril  Hyperlipidemia, unspecified hyperlipidemia type -Discussed lifestyle modifications - Plan: Lipid panel, atorvastatin (LIPITOR) 10 MG tablet  History of anemia  - Plan: CBC with Differential/Platelet, CBC with Differential/Platelet  Gastric banding status -Discussed importance of continuing daily vitamin  - Plan: TSH, T4, free  Encounter to establish care -Records relief  Diabetes mellitus screening  - Plan: Hemoglobin A1c   F/u prn.  If dry cough continues will need to d/c lisinopril and switch to a different bp med.

## 2017-08-01 NOTE — Patient Instructions (Addendum)
Allergies An allergy is when your body reacts to a substance in a way that is not normal. An allergic reaction can happen after you:  Eat something.  Breathe in something.  Touch something.  You can be allergic to:  Things that are only around during certain seasons, like molds and pollens.  Foods.  Drugs.  Insects.  Animal dander.  What are the signs or symptoms?  Puffiness (swelling). This may happen on the lips, face, tongue, mouth, or throat.  Sneezing.  Coughing.  Breathing loudly (wheezing).  Stuffy nose.  Tingling in the mouth.  A rash.  Itching.  Itchy, red, puffy areas of skin (hives).  Watery eyes.  Throwing up (vomiting).  Watery poop (diarrhea).  Dizziness.  Feeling faint or fainting.  Trouble breathing or swallowing.  A tight feeling in the chest.  A fast heartbeat. How is this diagnosed? Allergies can be diagnosed with:  A medical and family history.  Skin tests.  Blood tests.  A food diary. A food diary is a record of all the foods, drinks, and symptoms you have each day.  The results of an elimination diet. This diet involves making sure not to eat certain foods and then seeing what happens when you start eating them again.  How is this treated? There is no cure for allergies, but allergic reactions can be treated with medicine. Severe reactions usually need to be treated at a hospital. How is this prevented? The best way to prevent an allergic reaction is to avoid the thing you are allergic to. Allergy shots and medicines can also help prevent reactions in some cases. This information is not intended to replace advice given to you by your health care provider. Make sure you discuss any questions you have with your health care provider. Document Released: 02/10/2013 Document Revised: 06/12/2016 Document Reviewed: 07/28/2014 Elsevier Interactive Patient Education  2018 [place] Eating Plan DASH stands for  "Dietary Approaches to Stop Hypertension." The DASH eating plan is a healthy eating plan that has been shown to reduce high blood pressure (hypertension). It may also reduce your risk for type 2 diabetes, heart disease, and stroke. The DASH eating plan may also help with weight loss. What are tips for following this plan? General guidelines  Avoid eating more than 2,300 mg (milligrams) of salt (sodium) a day. If you have hypertension, you may need to reduce your sodium intake to 1,500 mg a day.  Limit alcohol intake to no more than 1 drink a day for nonpregnant women and 2 drinks a day for men. One drink equals 12 oz of beer, 5 oz of wine, or 1 oz of hard liquor.  Work with your health care provider to maintain a healthy body weight or to lose weight. Ask what an ideal weight is for you.  Get at least 30 minutes of exercise that causes your heart to beat faster (aerobic exercise) most days of the week. Activities may include walking, swimming, or biking.  Work with your health care provider or diet and nutrition specialist (dietitian) to adjust your eating plan to your individual calorie needs. Reading food labels  Check food labels for the amount of sodium per serving. Choose foods with less than 5 percent of the Daily Value of sodium. Generally, foods with less than 300 mg of sodium per serving fit into this eating plan.  To find whole grains, look for the word "whole" as the first word in the ingredient list. Shopping  Buy  products labeled as "low-sodium" or "no salt added."  Buy fresh foods. Avoid canned foods and premade or frozen meals. Cooking  Avoid adding salt when cooking. Use salt-free seasonings or herbs instead of table salt or sea salt. Check with your health care provider or pharmacist before using salt substitutes.  Do not fry foods. Cook foods using healthy methods such as baking, boiling, grilling, and broiling instead.  Cook with heart-healthy oils, such as olive,  canola, soybean, or sunflower oil. Meal planning   Eat a balanced diet that includes: ? 5 or more servings of fruits and vegetables each day. At each meal, try to fill half of your plate with fruits and vegetables. ? Up to 6-8 servings of whole grains each day. ? Less than 6 oz of lean meat, poultry, or fish each day. A 3-oz serving of meat is about the same size as a deck of cards. One egg equals 1 oz. ? 2 servings of low-fat dairy each day. ? A serving of nuts, seeds, or beans 5 times each week. ? Heart-healthy fats. Healthy fats called Omega-3 fatty acids are found in foods such as flaxseeds and coldwater fish, like sardines, salmon, and mackerel.  Limit how much you eat of the following: ? Canned or prepackaged foods. ? Food that is high in trans fat, such as fried foods. ? Food that is high in saturated fat, such as fatty meat. ? Sweets, desserts, sugary drinks, and other foods with added sugar. ? Full-fat dairy products.  Do not salt foods before eating.  Try to eat at least 2 vegetarian meals each week.  Eat more home-cooked food and less restaurant, buffet, and fast food.  When eating at a restaurant, ask that your food be prepared with less salt or no salt, if possible. What foods are recommended? The items listed may not be a complete list. Talk with your dietitian about what dietary choices are best for you. Grains Whole-grain or whole-wheat bread. Whole-grain or whole-wheat pasta. Brown rice. [place]. Bulgur. Whole-grain and low-sodium cereals. Pita bread. Low-fat, low-sodium crackers. Whole-wheat flour tortillas. Vegetables Fresh or frozen vegetables (raw, steamed, roasted, or grilled). Low-sodium or reduced-sodium tomato and vegetable juice. Low-sodium or reduced-sodium tomato sauce and tomato paste. Low-sodium or reduced-sodium canned vegetables. Fruits All fresh, dried, or frozen fruit. Canned fruit in natural juice (without added sugar). Meat and other  protein foods Skinless chicken or [place]. Ground chicken or [place]. Pork with fat trimmed off. Fish and seafood. Egg whites. Dried beans, peas, or lentils. Unsalted nuts, nut butters, and seeds. Unsalted canned beans. Lean cuts of beef with fat trimmed off. Low-sodium, lean deli meat. Dairy Low-fat (1%) or fat-free (skim) milk. Fat-free, low-fat, or reduced-fat cheeses. Nonfat, low-sodium ricotta or cottage cheese. Low-fat or nonfat yogurt. Low-fat, low-sodium cheese. Fats and oils Soft margarine without trans fats. Vegetable oil. Low-fat, reduced-fat, or light mayonnaise and salad dressings (reduced-sodium). Canola, safflower, olive, soybean, and sunflower oils. Avocado. Seasoning and other foods Herbs. Spices. Seasoning mixes without salt. Unsalted popcorn and pretzels. Fat-free sweets. What foods are not recommended? The items listed may not be a complete list. Talk with your dietitian about what dietary choices are best for you. Grains Baked goods made with fat, such as croissants, muffins, or some breads. Dry pasta or rice meal packs. Vegetables Creamed or fried vegetables. Vegetables in a cheese sauce. Regular canned vegetables (not low-sodium or reduced-sodium). Regular canned tomato sauce and paste (not low-sodium or reduced-sodium). Regular tomato and vegetable juice (not  low-sodium or reduced-sodium). Angie Fava. Olives. Fruits Canned fruit in a light or heavy syrup. Fried fruit. Fruit in cream or butter sauce. Meat and other protein foods Fatty cuts of meat. Ribs. Fried meat. Berniece Salines. Sausage. Bologna and other processed lunch meats. Salami. Fatback. Hotdogs. Bratwurst. Salted nuts and seeds. Canned beans with added salt. Canned or smoked fish. Whole eggs or egg yolks. Chicken or [place] with skin. Dairy Whole or 2% milk, cream, and half-and-half. Whole or full-fat cream cheese. Whole-fat or sweetened yogurt. Full-fat cheese. Nondairy creamers. Whipped toppings. Processed cheese and cheese  spreads. Fats and oils Butter. Stick margarine. Lard. Shortening. Ghee. Bacon fat. Tropical oils, such as coconut, palm kernel, or palm oil. Seasoning and other foods Salted popcorn and pretzels. Onion salt, garlic salt, seasoned salt, table salt, and sea salt. Worcestershire sauce. Tartar sauce. Barbecue sauce. Teriyaki sauce. Soy sauce, including reduced-sodium. Steak sauce. Canned and packaged gravies. Fish sauce. Oyster sauce. Cocktail sauce. Horseradish that you find on the shelf. Ketchup. Mustard. Meat flavorings and tenderizers. Bouillon cubes. Hot sauce and Tabasco sauce. Premade or packaged marinades. Premade or packaged taco seasonings. Relishes. Regular salad dressings. Where to find more information:  National Heart, Lung, and [place]: https://wilson-eaton.com/  American Heart Association: www.heart.org Summary  The DASH eating plan is a healthy eating plan that has been shown to reduce high blood pressure (hypertension). It may also reduce your risk for type 2 diabetes, heart disease, and stroke.  With the DASH eating plan, you should limit salt (sodium) intake to 2,300 mg a day. If you have hypertension, you may need to reduce your sodium intake to 1,500 mg a day.  When on the DASH eating plan, aim to eat more fresh fruits and vegetables, whole grains, lean proteins, low-fat dairy, and heart-healthy fats.  Work with your health care provider or diet and nutrition specialist (dietitian) to adjust your eating plan to your individual calorie needs. This information is not intended to replace advice given to you by your health care provider. Make sure you discuss any questions you have with your health care provider. Document Released: 10/05/2011 Document Revised: 10/09/2016 Document Reviewed: 10/09/2016 Elsevier Interactive Patient Education  2017 Elsevier Inc. Anemia, Nonspecific Anemia is a condition in which the concentration of red blood cells or hemoglobin in the blood is  below normal. Hemoglobin is a substance in red blood cells that carries oxygen to the tissues of the body. Anemia results in not enough oxygen reaching these tissues. What are the causes? Common causes of anemia include:  Excessive bleeding. Bleeding may be internal or external. This includes excessive bleeding from periods (in women) or from the intestine.  Poor nutrition.  Chronic kidney, thyroid, and liver disease.  Bone marrow disorders that decrease red blood cell production.  Cancer and treatments for cancer.  HIV, AIDS, and their treatments.  Spleen problems that increase red blood cell destruction.  Blood disorders.  Excess destruction of red blood cells due to infection, medicines, and autoimmune disorders.  What are the signs or symptoms?  Minor weakness.  Dizziness.  Headache.  Palpitations.  Shortness of breath, especially with exercise.  Paleness.  Cold sensitivity.  Indigestion.  Nausea.  Difficulty sleeping.  Difficulty concentrating. Symptoms may occur suddenly or they may develop slowly. How is this diagnosed? Additional blood tests are often needed. These help your health care provider determine the best treatment. Your health care provider will check your stool for blood and look for other causes of blood loss. How is this  treated? Treatment varies depending on the cause of the anemia. Treatment can include:  Supplements of iron, vitamin K81, or folic acid.  Hormone medicines.  A blood transfusion. This may be needed if blood loss is severe.  Hospitalization. This may be needed if there is significant continual blood loss.  Dietary changes.  Spleen removal.  Follow these instructions at home: Keep all follow-up appointments. It often takes many weeks to correct anemia, and having your health care provider check on your condition and your response to treatment is very important. Get help right away if:  You develop extreme weakness,  shortness of breath, or chest pain.  You become dizzy or have trouble concentrating.  You develop heavy vaginal bleeding.  You develop a rash.  You have bloody or [place], tarry stools.  You faint.  You vomit up blood.  You vomit repeatedly.  You have abdominal pain.  You have a fever or persistent symptoms for more than 2-3 days.  You have a fever and your symptoms suddenly get worse.  You are dehydrated. This information is not intended to replace advice given to you by your health care provider. Make sure you discuss any questions you have with your health care provider. Document Released: 11/23/2004 Document Revised: 03/29/2016 Document Reviewed: 04/11/2013 Elsevier Interactive Patient Education  2017 [place] taking Zyretec (or any other Allergy pill) daily.  If additional medication is needed to help with your symptoms you can use a nasal spray such as Flonase or a saline nasal spray.  If your symptoms become worse please follow up with the clinic.

## 2017-08-08 ENCOUNTER — Emergency Department (HOSPITAL_COMMUNITY)
Admission: EM | Admit: 2017-08-08 | Discharge: 2017-08-09 | Disposition: A | Discharge status: Home / Self Care | Payer: 59 | Attending: Emergency Medicine | Admitting: Emergency Medicine

## 2017-08-08 ENCOUNTER — Encounter (HOSPITAL_COMMUNITY): Payer: Self-pay | Admitting: Emergency Medicine

## 2017-08-08 DIAGNOSIS — R1012 Left upper quadrant pain: Secondary | ICD-10-CM | POA: Diagnosis not present

## 2017-08-08 DIAGNOSIS — E785 Hyperlipidemia, unspecified: Secondary | ICD-10-CM | POA: Diagnosis not present

## 2017-08-08 DIAGNOSIS — I10 Essential (primary) hypertension: Secondary | ICD-10-CM | POA: Diagnosis not present

## 2017-08-08 DIAGNOSIS — Z87891 Personal history of nicotine dependence: Secondary | ICD-10-CM | POA: Diagnosis not present

## 2017-08-08 DIAGNOSIS — N3001 Acute cystitis with hematuria: Secondary | ICD-10-CM | POA: Diagnosis not present

## 2017-08-08 DIAGNOSIS — Z79899 Other long term (current) drug therapy: Secondary | ICD-10-CM | POA: Insufficient documentation

## 2017-08-08 DIAGNOSIS — N3000 Acute cystitis without hematuria: Secondary | ICD-10-CM

## 2017-08-08 LAB — URINALYSIS, ROUTINE W REFLEX MICROSCOPIC
Bilirubin Urine: NEGATIVE
Glucose, UA: NEGATIVE mg/dL
Ketones, ur: 5 mg/dL — AB
Nitrite: NEGATIVE
Protein, ur: 30 mg/dL — AB
Specific Gravity, Urine: 1.023 (ref 1.005–1.030)
pH: 5 (ref 5.0–8.0)

## 2017-08-08 LAB — CBC
HCT: 41.7 % (ref 36.0–46.0)
Hemoglobin: 13.6 g/dL (ref 12.0–15.0)
MCH: 27.2 pg (ref 26.0–34.0)
MCHC: 32.6 g/dL (ref 30.0–36.0)
MCV: 83.4 fL (ref 78.0–100.0)
PLATELETS: 363 10*3/uL (ref 150–400)
RBC: 5 MIL/uL (ref 3.87–5.11)
RDW: 14.6 % (ref 11.5–15.5)
WBC: 10.1 10*3/uL (ref 4.0–10.5)

## 2017-08-08 LAB — COMPREHENSIVE METABOLIC PANEL WITH GFR
ALT: 17 U/L (ref 14–54)
AST: 20 U/L (ref 15–41)
Albumin: 3.7 g/dL (ref 3.5–5.0)
Alkaline Phosphatase: 85 U/L (ref 38–126)
Anion gap: 8 (ref 5–15)
BUN: 10 mg/dL (ref 6–20)
CO2: 26 mmol/L (ref 22–32)
Calcium: 9.4 mg/dL (ref 8.9–10.3)
Chloride: 106 mmol/L (ref 101–111)
Creatinine, Ser: 0.64 mg/dL (ref 0.44–1.00)
GFR calc Af Amer: >60 mL/min
GFR calc non Af Amer: >60 mL/min
Glucose, Bld: 93 mg/dL (ref 65–99)
Potassium: 4.5 mmol/L (ref 3.5–5.1)
Sodium: 140 mmol/L (ref 135–145)
Total Bilirubin: 0.5 mg/dL (ref 0.3–1.2)
Total Protein: 7.1 g/dL (ref 6.5–8.1)

## 2017-08-08 LAB — LIPASE, BLOOD: LIPASE: 21 U/L (ref 11–51)

## 2017-08-08 MED ORDER — SODIUM CHLORIDE 0.9 % IV BOLUS (SEPSIS)
1000.0000 mL | Freq: Once | INTRAVENOUS | Status: AC
  Administered 2017-08-09: 1000 mL via INTRAVENOUS

## 2017-08-08 MED ORDER — HYDROMORPHONE HCL 1 MG/ML IJ SOLN
1.0000 mg | Freq: Once | INTRAMUSCULAR | Status: AC
  Administered 2017-08-09: 1 mg via INTRAVENOUS
  Filled 2017-08-08: qty 1

## 2017-08-08 MED ORDER — ONDANSETRON HCL 4 MG/2ML IJ SOLN
4.0000 mg | Freq: Once | INTRAMUSCULAR | Status: AC
  Administered 2017-08-09: 4 mg via INTRAVENOUS
  Filled 2017-08-08: qty 2

## 2017-08-08 NOTE — ED Triage Notes (Signed)
Pt with abd pain since last night at 6pm. She was sent from [facility] for CT scan. Denies n/v/d or fever.

## 2017-08-08 NOTE — ED Notes (Signed)
Pt had drawn for labs:  Gold Blue Lavender Lt green Dark green x2

## 2017-08-09 ENCOUNTER — Encounter (HOSPITAL_COMMUNITY): Payer: Self-pay

## 2017-08-09 ENCOUNTER — Emergency Department (HOSPITAL_COMMUNITY): Payer: 59

## 2017-08-09 DIAGNOSIS — I10 Essential (primary) hypertension: Secondary | ICD-10-CM | POA: Diagnosis not present

## 2017-08-09 DIAGNOSIS — E785 Hyperlipidemia, unspecified: Secondary | ICD-10-CM | POA: Diagnosis not present

## 2017-08-09 DIAGNOSIS — Z87891 Personal history of nicotine dependence: Secondary | ICD-10-CM | POA: Diagnosis not present

## 2017-08-09 DIAGNOSIS — R109 Unspecified abdominal pain: Secondary | ICD-10-CM | POA: Diagnosis not present

## 2017-08-09 DIAGNOSIS — N3001 Acute cystitis with hematuria: Secondary | ICD-10-CM | POA: Diagnosis not present

## 2017-08-09 DIAGNOSIS — Z79899 Other long term (current) drug therapy: Secondary | ICD-10-CM | POA: Diagnosis not present

## 2017-08-09 DIAGNOSIS — R1012 Left upper quadrant pain: Secondary | ICD-10-CM | POA: Diagnosis not present

## 2017-08-09 MED ORDER — IOPAMIDOL (ISOVUE-300) INJECTION 61%
INTRAVENOUS | Status: AC
  Filled 2017-08-09: qty 100

## 2017-08-09 MED ORDER — CEPHALEXIN 500 MG PO CAPS: 500.0000 mg | ORAL_CAPSULE | Freq: Two times a day (BID) | ORAL | 0 refills | Status: DC

## 2017-08-09 MED ORDER — HYDROCODONE-ACETAMINOPHEN 5-325 MG PO TABS: 1.0000 | ORAL_TABLET | ORAL | 0 refills | Status: DC | PRN

## 2017-08-09 MED ORDER — IOPAMIDOL (ISOVUE-300) INJECTION 61%
100.0000 mL | Freq: Once | INTRAVENOUS | Status: AC | PRN
Start: 2017-08-09 — End: 2017-08-09
  Administered 2017-08-09: 100 mL via INTRAVENOUS

## 2017-08-09 MED ORDER — DEXTROSE 5 % IV SOLN
1.0000 g | Freq: Once | INTRAVENOUS | Status: AC
  Administered 2017-08-09: 1 g via INTRAVENOUS
  Filled 2017-08-09: qty 10

## 2017-08-09 MED FILL — CEPHALEXIN 500 MG CAPSULE: 500 | 10 days supply | Qty: 20 | Fill #0

## 2017-08-09 NOTE — ED Provider Notes (Signed)
[facility] DEPT Provider Note   CSN: [id number] Arrival date & time: 08/08/17  1511     History   Chief Complaint Chief Complaint  Patient presents with  . Abdominal Pain    HPI Sharon Fowler is a 46 y.o. female.  Patient presents to the emergency department for evaluation of abdominal pain. Patient reports that she had onset of pain at 6 PM last night. She went to urgent care and was sent to the ER for CT scan. Patient has not had nausea or vomiting. She has had constant and severe pain in the left upper abdomen. This worsens with movement.      Past Medical History:  Diagnosis Date  . ANEMIA, IRON DEFICIENCY UNSPEC 06/11/2009   corrected with iron replacement---menstrual reelated  . GOITER, MULTINODULAR 07/15/2009  . HYPERLIPIDEMIA 03/25/2008  . HYPERTENSION 03/25/2008  . OBESITY 03/25/2008    Patient Active Problem List   Diagnosis Date Noted  . Proteinuria 05/26/2017  . Non-intractable vomiting with nausea 05/26/2017  . Acute left-sided thoracic back pain 05/26/2017  . Flank pain 05/26/2017  . GOITER, MULTINODULAR 07/15/2009  . ANEMIA, IRON DEFICIENCY UNSPEC 06/11/2009  . HYPERLIPIDEMIA 03/25/2008  . OBESITY 03/25/2008  . HYPERTENSION 03/25/2008    Past Surgical History:  Procedure Laterality Date  . CESAREAN SECTION  7/91, 5/95   x2  . CHOLECYSTECTOMY  10/91   laparascopy  . LAPAROSCOPIC GASTRIC BANDING  2011  . TUBAL LIGATION     01/2003    OB History    No data available       Home Medications    Prior to Admission medications   Medication Sig Start Date End Date Taking? Authorizing Provider  atorvastatin (LIPITOR) 10 MG tablet Take 1 tablet (10 mg total) by mouth daily. 08/01/17  Yes Billie Ruddy, MD  Cyanocobalamin (B-12 PO) Take 1 tablet by mouth daily.    Yes [provider]  cyclobenzaprine (FLEXERIL) 10 MG tablet Take 0.5-1 tablets (5-10 mg total) by mouth 3 (three) times daily as needed for muscle spasms. 05/26/17  Yes  Emeterio Reeve, DO  ibuprofen (ADVIL,MOTRIN) 200 MG tablet Take 400 mg by mouth every 6 (six) hours as needed for moderate pain.   Yes [provider]  lisinopril (PRINIVIL,ZESTRIL) 10 MG tablet Take 1 tablet (10 mg total) by mouth daily. 08/01/17  Yes Billie Ruddy, MD  Multiple Vitamin (MULTIVITAMIN) tablet Take 1 tablet by mouth daily.   Yes [provider]  naproxen (NAPROSYN) 500 MG tablet Take 1 tablet (500 mg total) by mouth 2 (two) times daily with a meal. Take daily for 1-2 weeks then can use as-needed after that 05/26/17  Yes Emeterio Reeve, DO  cephALEXin (KEFLEX) 500 MG capsule Take 1 capsule (500 mg total) by mouth 2 (two) times daily. 08/09/17   Orpah Greek, MD  HYDROcodone-acetaminophen (NORCO/VICODIN) 5-325 MG tablet Take 1 tablet by mouth every 4 (four) hours as needed for moderate pain. 08/09/17   Orpah Greek, MD  ondansetron (ZOFRAN-ODT) 8 MG disintegrating tablet Take 1 tablet (8 mg total) by mouth every 8 (eight) hours as needed for nausea or vomiting. Patient not taking: Reported on 08/08/2017 05/26/17   Emeterio Reeve, DO    Family History Family History  Problem Relation Age of Onset  . Diabetes Mother   . Stroke Mother   . Alcohol abuse Father   . Stroke Father     Social History Social History  Substance Use Topics  . Smoking  status: Former Smoker    Quit date: 01/02/1991  . Smokeless tobacco: Never Used  . Alcohol use 0.0 oz/week     Comment: occ     Allergies   Chlorthalidone   Review of Systems Review of Systems  Gastrointestinal: Positive for abdominal pain.  All other systems reviewed and are negative.    Physical Exam Updated Vital Signs BP 130/67 (BP Location: Right Arm)   Pulse 84   Temp 98 F (36.7 C) (Oral)   Resp 18   Wt 98.4 kg (217 lb)   LMP 05/08/2011 (Approximate)   SpO2 97%   BMI 38.14 kg/m   Physical Exam  Constitutional: She is oriented to person, place, and time. She  appears well-developed and well-nourished. No distress.  HENT:  Head: Normocephalic and atraumatic.  Right Ear: Hearing normal.  Left Ear: Hearing normal.  Nose: Nose normal.  Mouth/Throat: Oropharynx is clear and moist and mucous membranes are normal.  Eyes: Pupils are equal, round, and reactive to light. Conjunctivae and EOM are normal.  Neck: Normal range of motion. Neck supple.  Cardiovascular: Regular rhythm, S1 normal and S2 normal.  Exam reveals no gallop and no friction rub.   No murmur heard. Pulmonary/Chest: Effort normal and breath sounds normal. No respiratory distress. She exhibits no tenderness.  Abdominal: Soft. Normal appearance and bowel sounds are normal. There is no hepatosplenomegaly. There is tenderness in the left upper quadrant. There is no rebound, no guarding, no tenderness at McBurney's point and negative Murphy's sign. No hernia.  Musculoskeletal: Normal range of motion.  Neurological: She is alert and oriented to person, place, and time. She has normal strength. No cranial nerve deficit or sensory deficit. Coordination normal. GCS eye subscore is 4. GCS verbal subscore is 5. GCS motor subscore is 6.  Skin: Skin is warm, dry and intact. No rash noted. No cyanosis.  Psychiatric: She has a normal mood and affect. Her speech is normal and behavior is normal. Thought content normal.  Nursing note and vitals reviewed.    ED Treatments / Results  Labs (all labs ordered are listed, but only abnormal results are displayed) Labs Reviewed  URINALYSIS, ROUTINE W REFLEX MICROSCOPIC - Abnormal; Notable for the following:       Result Value   APPearance CLOUDY (*)    Hgb urine dipstick SMALL (*)    Ketones, ur 5 (*)    Protein, ur 30 (*)    Leukocytes, UA LARGE (*)    Bacteria, UA MANY (*)    Squamous Epithelial / LPF 0-5 (*)    All other components within normal limits  LIPASE, BLOOD  COMPREHENSIVE METABOLIC PANEL  CBC    EKG  EKG Interpretation None        Radiology Ct Abdomen Pelvis W Contrast  Result Date: 08/09/2017 CLINICAL DATA:  Acute onset of generalized abdominal pain. Red blood cells and white blood cells in the urine. Initial encounter. EXAM: CT ABDOMEN AND PELVIS WITH CONTRAST TECHNIQUE: Multidetector CT imaging of the abdomen and pelvis was performed using the standard protocol following bolus administration of intravenous contrast. CONTRAST:  100 mL of Isovue 300 IV contrast COMPARISON:  Abdominal radiograph performed 05/26/2017 FINDINGS: Lower chest: Minimal bibasilar atelectasis is noted. The visualized portions of the mediastinum are unremarkable. Hepatobiliary: The liver is unremarkable in appearance. The patient is status post cholecystectomy, with clips noted at the gallbladder fossa. The common bile duct remains normal in caliber. Pancreas: The pancreas is within normal limits. Spleen: The spleen  is unremarkable in appearance. Adrenals/Urinary Tract: The adrenal glands are unremarkable in appearance. A small left renal cyst is noted. There is no evidence of hydronephrosis. No renal or ureteral stones are identified. No perinephric stranding is seen. Stomach/Bowel: The patient's gastric band is grossly unremarkable in appearance. There is trace inflammation about the gastric band tubing along the anterior peritoneum. The stomach is unremarkable in appearance. Vascular/Lymphatic: The abdominal aorta is unremarkable in appearance. The inferior vena cava is grossly unremarkable. No retroperitoneal lymphadenopathy is seen. No pelvic sidewall lymphadenopathy is identified. Reproductive: The bladder is mildly distended and grossly unremarkable. The uterus is unremarkable in appearance. A small amount of free fluid within the pelvis is likely physiologic in nature. The ovaries are relatively symmetric. No suspicious adnexal masses are seen. Bilateral tubal ligation clips are noted. Other: No additional soft tissue abnormalities are seen.  Musculoskeletal: No acute osseous abnormalities are identified. The visualized musculature is unremarkable in appearance. IMPRESSION: 1. No acute abnormality seen to explain the patient's symptoms. 2. Trace inflammation about the patient's gastric band tubing along the anterior peritoneum. Gastric band otherwise unremarkable in appearance. 3. Small left renal cyst noted. Electronically Signed   By: Garald Balding M.D.   On: 08/09/2017 01:59    Procedures Procedures (including critical care time)  Medications Ordered in ED Medications  iopamidol (ISOVUE-300) 61 % injection (not administered)  cefTRIAXone (ROCEPHIN) 1 g in dextrose 5 % 50 mL IVPB (not administered)  sodium chloride 0.9 % bolus 1,000 mL (1,000 mLs Intravenous New Bag/Given 08/09/17 0001)  HYDROmorphone (DILAUDID) injection 1 mg (1 mg Intravenous Given 08/09/17 0001)  ondansetron (ZOFRAN) injection 4 mg (4 mg Intravenous Given 08/09/17 0001)  iopamidol (ISOVUE-300) 61 % injection 100 mL (100 mLs Intravenous Contrast Given 08/09/17 0115)     Initial Impression / Assessment and Plan / ED Course  I have reviewed the triage vital signs and the nursing notes.  Pertinent labs & imaging results that were available during my care of the patient were reviewed by me and considered in my medical decision making (see chart for details).     Patient presents to the ER with complaints of severe left upper abdominal pain. Patient has a history of lap band surgery, performed by Dr. Excell Seltzer. She has not had any nausea or vomiting. No diarrhea or constipation. Patient is afebrile. She appeared very uncomfortable on arrival. This significantly improved after a single dose of Dilaudid. Examination after meds revealed mild left upper quadrant tenderness, no guarding or rebound. Lab work unremarkable. CT abdomen and pelvis does not show any acute abnormality with her LAP-BAND. No other abnormalities are noted. She did have evidence of urinary tract  infection on urinalysis, administered Rocephin and will treat with Keflex outpatient, follow-up with Dr. Excell Seltzer in the office. Return if symptoms worsen.  Final Clinical Impressions(s) / ED Diagnoses   Final diagnoses:  Left upper quadrant pain  Acute cystitis without hematuria    New Prescriptions New Prescriptions   CEPHALEXIN (KEFLEX) 500 MG CAPSULE    Take 1 capsule (500 mg total) by mouth 2 (two) times daily.   HYDROCODONE-ACETAMINOPHEN (NORCO/VICODIN) 5-325 MG TABLET    Take 1 tablet by mouth every 4 (four) hours as needed for moderate pain.     Orpah Greek, MD 08/09/17 [phone]

## 2017-08-13 ENCOUNTER — Ambulatory Visit: Payer: Self-pay | Admitting: Family Medicine

## 2017-08-15 ENCOUNTER — Other Ambulatory Visit: Payer: Self-pay | Admitting: Surgery

## 2017-08-15 DIAGNOSIS — Z9884 Bariatric surgery status: Secondary | ICD-10-CM | POA: Diagnosis not present

## 2017-08-15 DIAGNOSIS — R1012 Left upper quadrant pain: Secondary | ICD-10-CM | POA: Diagnosis not present

## 2017-08-15 NOTE — H&P (Signed)
Sharon Fowler  Location: [facility] Patient #: [id number] DOB: 20-Jul-1971 Married / Language: English / Race: Dow[place] or African American Female  History of Present Illness   The patient is a 46 year old female is here for LapBand followup. Sharon Fowler is a 46 year old patient who received an AP-Standard lap band in December 2011. I last saw her in January of this year when she had some lack of restriction and we did a small fill. She was also having some intermittent crampy left upper quadrant abdominal pain that seemed musculoskeletal. She said this has been on and off but not as bad. She did experience some increased restriction with the filling January and has lost 35 pounds for now total weight loss of 72 pounds from surgery.   However, last week she had the fairly sudden onset, 1 week ago, of left flank and left upper quadrant pain. She describes fairly severe aching pain. Often worse with movement or deep breathing. She went to urgent care and subsequently to the emergency room. A CT scan was obtained showing some possible inflammation along the band tubing, very minimal in the upper abdomen along the anterior abdominal wall but the band itself unremarkable. I reviewed this with Dr. Lucia Gaskins, we do not see any definite inflammation or evidence of erosion. Nothing else remarkable on CT. Lab work showed a possible UTI with red and white cells in her urine and she was given a course of antibiotics which she is just finishing.  White blood count was 10,000. She states the pain may be a little better but is persistent and significant.  Problem List/Past Medical Marland Kitchen T. Hoxworth, MD; 08/15/2017 3:58 PM) MORBID OBESITY WITH BMI OF 45.0-49.9, ADULT (E66.01)  ENCOUNTER FOR FITTING AND ADJUSTMENT OF GASTRIC LAP BAND (Z46.51)  GASTRIC BANDING STATUS (Z98.84)  LEFT UPPER QUADRANT PAIN (R10.12)   Past Surgical History Marland Kitchen T. Hoxworth, MD; 08/15/2017 3:58 PM) Lap  Band  Gallbladder Surgery - Laparoscopic  Resection of Stomach  Oral Surgery  Cesarean Section - Multiple   Diagnostic Studies History Marland Kitchen T. Hoxworth, MD; 08/15/2017 3:58 PM) Mammogram  within last year Colonoscopy  never Pap Smear  1-5 years ago  Allergies Malachy Moan, RMA; 08/15/2017 3:20 PM) Chlorthalidone *DIURETICS*   Medication History Malachy Moan, RMA; 08/15/2017 3:21 PM) Cyanocobalamin (Oral) Specific strength unknown - Active. No Current Medications (Taken starting 08/15/2017) Multivitamins (Oral) Active. Cephalexin (500MG Capsule, Oral) Active. Lisinopril (10MG Tablet, Oral) Active. Atorvastatin Calcium (10MG Tablet, Oral) Active. Medications Reconciled  Social History Marland Kitchen T. Hoxworth, MD; 08/15/2017 3:58 PM) No caffeine use  Alcohol use  Occasional alcohol use. Tobacco use  Former smoker. No drug use   Family History Marland Kitchen T. Hoxworth, MD; 08/15/2017 3:58 PM) Alcohol Abuse  Father, Mother. Heart disease in female family member before age 7  Heart disease in female family member before age 53  Respiratory Condition  Mother. Hypertension  Mother. Cerebrovascular Accident  Mother. Arthritis  Mother. Heart Disease  Family Members In General. Diabetes Mellitus  Family Members In General, Mother, Sister.  Pregnancy / Birth History Marland Kitchen T. Hoxworth, MD; 08/15/2017 3:58 PM) Age at menarche  35 years. Irregular periods  Gravida  5 Para  5 Maternal age  64-20  Other Problems Marland Kitchen T. Hoxworth, MD; 08/15/2017 3:58 PM) High blood pressure  Hemorrhoids  Thyroid Disease  Hypercholesterolemia  Cholelithiasis   Vitals Malachy Moan RMA; 08/15/2017 3:21 PM) 08/15/2017 3:21 PM Weight: 216 lb Height: 64in Body Surface Area: 2.02  m Body Mass Index: 37.08 kg/m  Temp.: 97.61F  Pulse: 82 (Regular)  BP: 120/86 (Sitting, Left Arm, Standard)   Physical Exam  General:  overweight African American female who appears uncomfortable in the office and painful moving about. Skin: No rash or infection HEENT: No palpable masses or adenopathy Lungs: Clear respirations bilaterally although some pain with deep breathing Cardiac: Regular rate and rhythm. No edema.  Abdomen: There is moderate tenderness in the left upper quadrant and epigastrium. No peritoneal signs. Also some tenderness along the costal margin on the left.   Port site in the right abdomen is unremarkable and nontender. No hernias. Extremities: No edema or deformity Neurologic: Alert and fully oriented. Gait normal. Affect normal.  Assessment & Plan  1.  GASTRIC BANDING STATUS (Z98.84)  Impression: Overall good weight loss of 72 pounds and appropriate restriction after the last fill  2.  LEFT UPPER QUADRANT PAIN (R10.12)  Impression: Recent onset of significant left upper quadrant abdominal pain of uncertain etiology. CT is really not remarkable even in terms of her band although some questionable inflammation along the tubing with the band appearing unremarkable. Not responding to antibiotics for possible UTI in terms of pain.  I think with these findings it would have to be some concern of erosion and infection causing inflammation along the tubing. I discussed this with Dr. Lucia Gaskins and I think upper endoscopy would be in order to rule this out as the next step. He is in agreement and will plan to proceed with upper endoscopy this week. Further workup pending that evaluation.  Plans:  Follow up with [place] in the office in 2 weeks.  Alphonsa Overall, MD, [facility] Surgery Pager: [phone] Office phone:  [phone]

## 2017-08-16 ENCOUNTER — Ambulatory Visit (HOSPITAL_COMMUNITY): Payer: 59 | Admitting: Anesthesiology

## 2017-08-16 ENCOUNTER — Ambulatory Visit: Payer: Self-pay | Admitting: Surgery

## 2017-08-16 ENCOUNTER — Encounter (HOSPITAL_COMMUNITY): Payer: Self-pay

## 2017-08-16 ENCOUNTER — Encounter (HOSPITAL_COMMUNITY)
Admission: RE | Disposition: A | Discharge status: Home / Self Care | Payer: Self-pay | Source: Ambulatory Visit | Attending: Surgery

## 2017-08-16 ENCOUNTER — Ambulatory Visit (HOSPITAL_COMMUNITY)
Admission: RE | Admit: 2017-08-16 | Discharge: 2017-08-16 | Disposition: A | Discharge status: Home / Self Care | Payer: 59 | Source: Ambulatory Visit | Attending: Surgery | Admitting: Surgery

## 2017-08-16 DIAGNOSIS — Z888 Allergy status to other drugs, medicaments and biological substances status: Secondary | ICD-10-CM | POA: Diagnosis not present

## 2017-08-16 DIAGNOSIS — Z6838 Body mass index (BMI) 38.0-38.9, adult: Secondary | ICD-10-CM | POA: Insufficient documentation

## 2017-08-16 DIAGNOSIS — E7800 Pure hypercholesterolemia, unspecified: Secondary | ICD-10-CM | POA: Insufficient documentation

## 2017-08-16 DIAGNOSIS — T85898A Other specified complication of other internal prosthetic devices, implants and grafts, initial encounter: Secondary | ICD-10-CM | POA: Diagnosis not present

## 2017-08-16 DIAGNOSIS — K9509 Other complications of gastric band procedure: Secondary | ICD-10-CM | POA: Diagnosis not present

## 2017-08-16 DIAGNOSIS — Y733 Surgical instruments, materials and gastroenterology and urology devices (including sutures) associated with adverse incidents: Secondary | ICD-10-CM | POA: Diagnosis not present

## 2017-08-16 DIAGNOSIS — E785 Hyperlipidemia, unspecified: Secondary | ICD-10-CM | POA: Diagnosis not present

## 2017-08-16 DIAGNOSIS — Z79899 Other long term (current) drug therapy: Secondary | ICD-10-CM | POA: Diagnosis not present

## 2017-08-16 DIAGNOSIS — R1012 Left upper quadrant pain: Secondary | ICD-10-CM | POA: Diagnosis not present

## 2017-08-16 DIAGNOSIS — Z87891 Personal history of nicotine dependence: Secondary | ICD-10-CM | POA: Diagnosis not present

## 2017-08-16 DIAGNOSIS — I10 Essential (primary) hypertension: Secondary | ICD-10-CM | POA: Insufficient documentation

## 2017-08-16 DIAGNOSIS — D509 Iron deficiency anemia, unspecified: Secondary | ICD-10-CM | POA: Diagnosis not present

## 2017-08-16 HISTORY — PX: ESOPHAGOGASTRODUODENOSCOPY (EGD) WITH PROPOFOL: SHX5813

## 2017-08-16 SURGERY — ESOPHAGOGASTRODUODENOSCOPY (EGD) WITH PROPOFOL
Anesthesia: Monitor Anesthesia Care

## 2017-08-16 MED ORDER — MIDAZOLAM HCL 5 MG/5ML IJ SOLN
INTRAMUSCULAR | Status: DC | PRN
  Administered 2017-08-16: 2 mg via INTRAVENOUS

## 2017-08-16 MED ORDER — ONDANSETRON HCL 4 MG/2ML IJ SOLN
INTRAMUSCULAR | Status: DC | PRN
  Administered 2017-08-16: 4 mg via INTRAVENOUS

## 2017-08-16 MED ORDER — LACTATED RINGERS IV SOLN
INTRAVENOUS | Status: DC | PRN
  Administered 2017-08-16: 11:00:00 via INTRAVENOUS

## 2017-08-16 MED ORDER — PROPOFOL 500 MG/50ML IV EMUL
INTRAVENOUS | Status: DC | PRN
  Administered 2017-08-16: 100 mg via INTRAVENOUS

## 2017-08-16 MED ORDER — PROPOFOL 500 MG/50ML IV EMUL
INTRAVENOUS | Status: DC | PRN
  Administered 2017-08-16: 125 ug/kg/min via INTRAVENOUS

## 2017-08-16 MED ORDER — SODIUM CHLORIDE 0.9 % IV SOLN: INTRAVENOUS | Status: DC

## 2017-08-16 MED ORDER — MIDAZOLAM HCL 2 MG/2ML IJ SOLN
INTRAMUSCULAR | Status: AC
  Filled 2017-08-16: qty 2

## 2017-08-16 SURGICAL SUPPLY — 14 items

## 2017-08-16 NOTE — Discharge Instructions (Signed)
Esophagogastroduodenoscopy, Care After Refer to this sheet in the next few weeks. These instructions provide you with information about caring for yourself after your procedure. Your health care provider may also give you more specific instructions. Your treatment has been planned according to current medical practices, but problems sometimes occur. Call your health care provider if you have any problems or questions after your procedure. What can I expect after the procedure? After the procedure, it is common to have:  A sore throat.  Nausea.  Bloating.  Dizziness.  Fatigue.  Follow these instructions at home:  Do not eat or drink anything until the numbing medicine (local anesthetic) has worn off and your gag reflex has returned. You will know that the local anesthetic has worn off when you can swallow comfortably.  Do not drive for 24 hours if you received a medicine to help you relax (sedative).  If your health care provider took a tissue sample for testing during the procedure, make sure to get your test results. This is your responsibility. Ask your health care provider or the department performing the test when your results will be ready.  Keep all follow-up visits as told by your health care provider. This is important. Contact a health care provider if:  You cannot stop coughing.  You are not urinating.  You are urinating less than usual. Get help right away if:  You have trouble swallowing.  You cannot eat or drink.  You have throat or chest pain that gets worse.  You are dizzy or light-headed.  You faint.  You have nausea or vomiting.  You have chills.  You have a fever.  You have severe abdominal pain.  You have Pelzer, tarry, or bloody stools. This information is not intended to replace advice given to you by your health care provider. Make sure you discuss any questions you have with your health care provider. Document Released: 10/02/2012 Document  Revised: 03/23/2016 Document Reviewed: 09/09/2015 Elsevier Interactive Patient Education  2018 [place] TO PATIENT  Activity:  Driving - May drive tomoroow  Diet:  As tolerated  Plan:  To schedule surgery for removal of the lap band on Monday, 10/22.  Medications and dosages:  Resume your home medications.  Call Dr. Excell Seltzer or his office  [phone]) if you have:  Temperature greater than 100.4,  Persistent nausea and vomiting,  Any other questions or concerns you may have after discharge.  In an emergency, call 911 or go to an Emergency Department at a nearby hospital.

## 2017-08-16 NOTE — Patient Instructions (Signed)
Malicia Blasdel Croffer  08/16/2017   Your procedure is scheduled on: 08-20-17  Report to [facility] Main  Entrance Take [place]  elevators to 3rd floor to  [place] at     [phone].    Call this number if you have problems the morning of surgery [phone]    Remember: ONLY 1 PERSON MAY GO WITH YOU TO SHORT STAY TO GET  READY MORNING OF [place].  Do not eat food or drink liquids :After Midnight.     Take these medicines the morning of surgery with A SIP OF WATER: Cephalexin                                You may not have any metal on your body including hair pins and              piercings  Do not wear jewelry, make-up, lotions, powders or perfumes, deodorant             Do not wear nail polish.  Do not shave  48 hours prior to surgery.          Do not bring valuables to the hospital. [place].  Contacts, dentures or bridgework may not be worn into surgery.  Leave suitcase in the car. After surgery it may be brought to your room.               Please read over the following fact sheets you were given: _____________________________________________________________________          [facility] - Preparing for Surgery Before surgery, you can play an important role.  Because skin is not sterile, your skin needs to be as free of germs as possible.  You can reduce the number of germs on your skin by washing with CHG (chlorahexidine gluconate) soap before surgery.  CHG is an antiseptic cleaner which kills germs and bonds with the skin to continue killing germs even after washing. Please DO NOT use if you have an allergy to CHG or antibacterial soaps.  If your skin becomes reddened/irritated stop using the CHG and inform your nurse when you arrive at Short Stay. Do not shave (including legs and underarms) for at least 48 hours prior to the first CHG shower.  You may shave your face/neck. Please follow these  instructions carefully:  1.  Shower with CHG Soap the night before surgery and the  morning of Surgery.  2.  If you choose to wash your hair, wash your hair first as usual with your  normal  shampoo.  3.  After you shampoo, rinse your hair and body thoroughly to remove the  shampoo.                           4.  Use CHG as you would any other liquid soap.  You can apply chg directly  to the skin and wash                       Gently with a scrungie or clean washcloth.  5.  Apply the CHG Soap to your body ONLY FROM THE NECK DOWN.   Do not  use on face/ open                           Wound or open sores. Avoid contact with eyes, ears mouth and genitals (private parts).                       Wash face,  Genitals (private parts) with your normal soap.             6.  Wash thoroughly, paying special attention to the area where your surgery  will be performed.  7.  Thoroughly rinse your body with warm water from the neck down.  8.  DO NOT shower/wash with your normal soap after using and rinsing off  the CHG Soap.                9.  Pat yourself dry with a clean towel.            10.  Wear clean pajamas.            11.  Place clean sheets on your bed the night of your first shower and do not  sleep with pets. Day of Surgery : Do not apply any lotions/deodorants the morning of surgery.  Please wear clean clothes to the hospital/surgery center.  FAILURE TO FOLLOW THESE INSTRUCTIONS MAY RESULT IN THE CANCELLATION OF YOUR SURGERY PATIENT SIGNATURE_________________________________  NURSE SIGNATURE__________________________________  ________________________________________________________________________

## 2017-08-16 NOTE — Anesthesia Postprocedure Evaluation (Signed)
Anesthesia Post Note  Patient: Sharon Fowler  Procedure(s) Performed: ESOPHAGOGASTRODUODENOSCOPY (EGD) WITH PROPOFOL (N/A )     Patient location during evaluation: PACU Anesthesia Type: MAC Level of consciousness: awake and alert Pain management: pain level controlled Vital Signs Assessment: post-procedure vital signs reviewed and stable Respiratory status: spontaneous breathing and respiratory function stable Cardiovascular status: stable Postop Assessment: no apparent nausea or vomiting Anesthetic complications: no    Last Vitals:  Vitals:   08/16/17 0949 08/16/17 1110  BP: (!) 165/110 (!) 136/103  Pulse: 74 70  Resp: 12 19  Temp: 36.7 C   SpO2: 100% 100%    Last Pain:  Vitals:   08/16/17 0949  TempSrc: Oral  PainSc: 5                  Reiley Keisler DANIEL

## 2017-08-16 NOTE — Op Note (Signed)
08/16/2017  10:53 AM  PATIENT:  LYNDSIE WALLMAN, 46 y.o., female, MRN: [id number]  PREOP DIAGNOSIS:  Lap band, pain in LUQ of abdomen  POSTOP DIAGNOSIS:   Erosion of lap band  [photo in chart]  PROCEDURE:  Esophagogastroduedonoscopy  SURGEON:   Alphonsa Overall, M.D.  ANESTHESIA:   Propofol  INDICATIONS FOR PROCEDURE:  MORRISSA SHEIN is a 46 y.o. (DOB: 1971-07-25)  AA female whose primary care physician is Billie Ruddy, MD and comes for upper endoscopy to evaluate her lap band.   She had a standard lap band by Dr. Excell Seltzer in December 2011.  She has had LUQ pain over the last week.   The indications and risks of the endoscopy were explained to the patient.  The risks include, but are not limited to, perforation, bleeding, or injury to the bowel.  PROCEDURE:  The patient was monitored with a pulse oximetry, BP cuff, and EKG.  The patient has nasal O2 flowing during the procedure.   Anesthesia was done with propofol by nurse anesthesia.  Findings include:   Esophagus:   Normal   GE junction at:  38 cm   Lap band:  She has an approximate 4 cm pouch, on retroflexion, she has erosion of the lap band   Stomach: Normal   Duodenum:   Normal to 2nd portion of the duodenum  PLAN:  Discussed with Dr. Excell Seltzer - plan lap band extraction on Monday.  Alphonsa Overall, MD, [facility] Surgery Pager: [phone] Office phone:  [phone]

## 2017-08-16 NOTE — Transfer of Care (Signed)
Immediate Anesthesia Transfer of Care Note  Patient: Sharon Fowler  Procedure(s) Performed: Procedure(s): ESOPHAGOGASTRODUODENOSCOPY (EGD) WITH PROPOFOL (N/A)  Patient Location: PACU  Anesthesia Type:MAC  Level of Consciousness:  sedated, patient cooperative and responds to stimulation  Airway & Oxygen Therapy:Patient Spontanous Breathing and Patient connected to face mask oxgen  Post-op Assessment:  Report given to PACU RN and Post -op Vital signs reviewed and stable  Post vital signs:  Reviewed and stable  Last Vitals:  Vitals:   08/16/17 0949  BP: (!) 165/110  Pulse: 74  Resp: 12  Temp: 36.7 C  SpO2: 892%    Complications: No apparent anesthesia complications

## 2017-08-16 NOTE — Interval H&P Note (Signed)
History and Physical Interval Note:  08/16/2017 10:01 AM  Sharon Fowler  has presented today for surgery, with the diagnosis of lap band ledt upper abdominal pain  The various methods of treatment have been discussed with the patient and family. Son, Rupert, with patient.    After consideration of risks, benefits and other options for treatment, the patient has consented to  Procedure(s): ESOPHAGOGASTRODUODENOSCOPY (EGD) WITH PROPOFOL (N/A) as a surgical intervention .  The patient's history has been reviewed, patient examined, no change in status, stable for surgery.  I have reviewed the patient's chart and labs.  Questions were answered to the patient's satisfaction.     Valera Vallas H

## 2017-08-16 NOTE — Anesthesia Preprocedure Evaluation (Signed)
Anesthesia Evaluation  Patient identified by MRN, date of birth, ID band Patient awake    Reviewed: Allergy & Precautions, NPO status , Patient's Chart, lab work & pertinent test results  Airway Mallampati: II  TM Distance: >3 FB Neck ROM: Full    Dental no notable dental hx. (+) Dental Advisory Given   Pulmonary former smoker,    Pulmonary exam normal        Cardiovascular hypertension, Normal cardiovascular exam     Neuro/Psych negative neurological ROS  negative psych ROS   GI/Hepatic negative GI ROS, Neg liver ROS,   Endo/Other  Morbid obesity  Renal/GU negative Renal ROS  negative genitourinary   Musculoskeletal negative musculoskeletal ROS (+)   Abdominal   Peds negative pediatric ROS (+)  Hematology negative hematology ROS (+)   Anesthesia Other Findings   Reproductive/Obstetrics negative OB ROS                             Anesthesia Physical Anesthesia Plan  ASA: II  Anesthesia Plan: MAC   Post-op Pain Management:    Induction:   PONV Risk Score and Plan: 2 and Ondansetron and Dexamethasone  Airway Management Planned: Natural Airway  Additional Equipment:   Intra-op Plan:   Post-operative Plan:   Informed Consent: I have reviewed the patients History and Physical, chart, labs and discussed the procedure including the risks, benefits and alternatives for the proposed anesthesia with the patient or authorized representative who has indicated his/her understanding and acceptance.   Dental advisory given  Plan Discussed with: Anesthesiologist and CRNA  Anesthesia Plan Comments:         Anesthesia Quick Evaluation

## 2017-08-16 NOTE — Progress Notes (Signed)
Labs 08-08-17 cbc, cmp epic  CT abd and pelvis 08/09/17 epic

## 2017-08-17 ENCOUNTER — Encounter (HOSPITAL_COMMUNITY): Payer: Self-pay

## 2017-08-17 ENCOUNTER — Encounter (HOSPITAL_COMMUNITY)
Admission: RE | Admit: 2017-08-17 | Discharge: 2017-08-17 | Disposition: A | Discharge status: Home / Self Care | Payer: 59 | Source: Ambulatory Visit | Attending: General Surgery | Admitting: General Surgery

## 2017-08-17 DIAGNOSIS — I10 Essential (primary) hypertension: Secondary | ICD-10-CM | POA: Diagnosis not present

## 2017-08-17 DIAGNOSIS — Z9884 Bariatric surgery status: Secondary | ICD-10-CM | POA: Diagnosis not present

## 2017-08-17 DIAGNOSIS — T85898A Other specified complication of other internal prosthetic devices, implants and grafts, initial encounter: Secondary | ICD-10-CM | POA: Diagnosis not present

## 2017-08-17 DIAGNOSIS — Z87891 Personal history of nicotine dependence: Secondary | ICD-10-CM | POA: Diagnosis not present

## 2017-08-17 DIAGNOSIS — Z79899 Other long term (current) drug therapy: Secondary | ICD-10-CM | POA: Diagnosis not present

## 2017-08-17 DIAGNOSIS — K660 Peritoneal adhesions (postprocedural) (postinfection): Secondary | ICD-10-CM | POA: Diagnosis not present

## 2017-08-17 DIAGNOSIS — Z6841 Body Mass Index (BMI) 40.0 and over, adult: Secondary | ICD-10-CM | POA: Diagnosis not present

## 2017-08-17 DIAGNOSIS — Z888 Allergy status to other drugs, medicaments and biological substances status: Secondary | ICD-10-CM | POA: Diagnosis not present

## 2017-08-17 LAB — CBC WITH DIFFERENTIAL/PLATELET
BASOS ABS: 0 10*3/uL (ref 0.0–0.1)
BASOS PCT: 0 %
Eosinophils Absolute: 0.2 10*3/uL (ref 0.0–0.7)
Eosinophils Relative: 3 %
HCT: 39.9 % (ref 36.0–46.0)
Hemoglobin: 12.9 g/dL (ref 12.0–15.0)
Lymphocytes Relative: 32 %
Lymphs Abs: 2.2 10*3/uL (ref 0.7–4.0)
MCH: 27.2 pg (ref 26.0–34.0)
MCHC: 32.3 g/dL (ref 30.0–36.0)
MCV: 84 fL (ref 78.0–100.0)
MONO ABS: 0.5 10*3/uL (ref 0.1–1.0)
Monocytes Relative: 7 %
Neutro Abs: 4 10*3/uL (ref 1.7–7.7)
Neutrophils Relative %: 58 %
PLATELETS: 407 10*3/uL — AB (ref 150–400)
RBC: 4.75 MIL/uL (ref 3.87–5.11)
RDW: 14.6 % (ref 11.5–15.5)
WBC: 6.9 10*3/uL (ref 4.0–10.5)

## 2017-08-17 NOTE — Progress Notes (Signed)
[place] EKG done 08/17/17 in epic

## 2017-08-20 ENCOUNTER — Encounter (HOSPITAL_COMMUNITY): Payer: Self-pay | Admitting: *Deleted

## 2017-08-20 ENCOUNTER — Inpatient Hospital Stay (HOSPITAL_COMMUNITY)
Admission: AD | Admit: 2017-08-20 | Discharge: 2017-08-22 | DRG: 988 | Disposition: A | Discharge status: Home / Self Care | Payer: 59 | Source: Ambulatory Visit | Attending: General Surgery | Admitting: General Surgery

## 2017-08-20 ENCOUNTER — Encounter (HOSPITAL_COMMUNITY)
Admission: AD | Disposition: A | Discharge status: Home / Self Care | Payer: Self-pay | Source: Ambulatory Visit | Attending: General Surgery

## 2017-08-20 ENCOUNTER — Ambulatory Visit (HOSPITAL_COMMUNITY): Payer: 59 | Admitting: Certified Registered Nurse Anesthetist

## 2017-08-20 DIAGNOSIS — Z79899 Other long term (current) drug therapy: Secondary | ICD-10-CM | POA: Diagnosis not present

## 2017-08-20 DIAGNOSIS — Z888 Allergy status to other drugs, medicaments and biological substances status: Secondary | ICD-10-CM

## 2017-08-20 DIAGNOSIS — Y838 Other surgical procedures as the cause of abnormal reaction of the patient, or of later complication, without mention of misadventure at the time of the procedure: Secondary | ICD-10-CM | POA: Diagnosis present

## 2017-08-20 DIAGNOSIS — Z9884 Bariatric surgery status: Secondary | ICD-10-CM

## 2017-08-20 DIAGNOSIS — T85898A Other specified complication of other internal prosthetic devices, implants and grafts, initial encounter: Secondary | ICD-10-CM | POA: Diagnosis not present

## 2017-08-20 DIAGNOSIS — Z87891 Personal history of nicotine dependence: Secondary | ICD-10-CM

## 2017-08-20 DIAGNOSIS — I10 Essential (primary) hypertension: Secondary | ICD-10-CM | POA: Diagnosis present

## 2017-08-20 DIAGNOSIS — E785 Hyperlipidemia, unspecified: Secondary | ICD-10-CM | POA: Diagnosis not present

## 2017-08-20 DIAGNOSIS — K9509 Other complications of gastric band procedure: Secondary | ICD-10-CM | POA: Diagnosis present

## 2017-08-20 DIAGNOSIS — R809 Proteinuria, unspecified: Secondary | ICD-10-CM

## 2017-08-20 DIAGNOSIS — D509 Iron deficiency anemia, unspecified: Secondary | ICD-10-CM | POA: Diagnosis not present

## 2017-08-20 DIAGNOSIS — K660 Peritoneal adhesions (postprocedural) (postinfection): Secondary | ICD-10-CM | POA: Diagnosis present

## 2017-08-20 DIAGNOSIS — Y828 Other medical devices associated with adverse incidents: Secondary | ICD-10-CM | POA: Diagnosis present

## 2017-08-20 DIAGNOSIS — Z6841 Body Mass Index (BMI) 40.0 and over, adult: Secondary | ICD-10-CM

## 2017-08-20 SURGERY — REMOVAL, GASTRIC BAND, LAPAROSCOPIC
Anesthesia: General | Site: Abdomen

## 2017-08-20 MED ORDER — EVICEL 5 ML EX KIT
PACK | Freq: Once | CUTANEOUS | Status: AC
  Administered 2017-08-20: 5 mL
  Filled 2017-08-20: qty 1

## 2017-08-20 MED ORDER — FENTANYL CITRATE (PF) 100 MCG/2ML IJ SOLN
INTRAMUSCULAR | Status: DC | PRN
  Administered 2017-08-20 (×5): 50 ug via INTRAVENOUS

## 2017-08-20 MED ORDER — SUGAMMADEX SODIUM 200 MG/2ML IV SOLN
INTRAVENOUS | Status: AC
  Filled 2017-08-20: qty 2

## 2017-08-20 MED ORDER — 0.9 % SODIUM CHLORIDE (POUR BTL) OPTIME
TOPICAL | Status: DC | PRN
  Administered 2017-08-20: 1000 mL

## 2017-08-20 MED ORDER — ROCURONIUM BROMIDE 50 MG/5ML IV SOSY
PREFILLED_SYRINGE | INTRAVENOUS | Status: AC
  Filled 2017-08-20: qty 5

## 2017-08-20 MED ORDER — PROPOFOL 10 MG/ML IV BOLUS
INTRAVENOUS | Status: AC
  Filled 2017-08-20: qty 20

## 2017-08-20 MED ORDER — ENOXAPARIN SODIUM 40 MG/0.4ML ~~LOC~~ SOLN
40.0000 mg | SUBCUTANEOUS | Status: DC
  Administered 2017-08-20 – 2017-08-21 (×2): 40 mg via SUBCUTANEOUS
  Filled 2017-08-20 (×2): qty 0.4

## 2017-08-20 MED ORDER — HYDRALAZINE HCL 20 MG/ML IJ SOLN: 10.0000 mg | INTRAMUSCULAR | Status: DC | PRN

## 2017-08-20 MED ORDER — MORPHINE SULFATE (PF) 2 MG/ML IV SOLN
2.0000 mg | INTRAVENOUS | Status: DC | PRN
  Administered 2017-08-20 – 2017-08-21 (×5): 2 mg via INTRAVENOUS
  Administered 2017-08-22: 4 mg via INTRAVENOUS
  Filled 2017-08-20 (×8): qty 1

## 2017-08-20 MED ORDER — ONDANSETRON HCL 4 MG/2ML IJ SOLN
INTRAMUSCULAR | Status: AC
  Filled 2017-08-20: qty 2

## 2017-08-20 MED ORDER — HYDROMORPHONE HCL 1 MG/ML IJ SOLN
0.2500 mg | INTRAMUSCULAR | Status: DC | PRN
  Administered 2017-08-20 (×4): 0.5 mg via INTRAVENOUS

## 2017-08-20 MED ORDER — DEXAMETHASONE SODIUM PHOSPHATE 10 MG/ML IJ SOLN
INTRAMUSCULAR | Status: DC | PRN
  Administered 2017-08-20: 10 mg via INTRAVENOUS

## 2017-08-20 MED ORDER — ONDANSETRON HCL 4 MG/2ML IJ SOLN
INTRAMUSCULAR | Status: DC | PRN
  Administered 2017-08-20: 4 mg via INTRAVENOUS

## 2017-08-20 MED ORDER — POTASSIUM CHLORIDE IN NACL 20-0.9 MEQ/L-% IV SOLN
INTRAVENOUS | Status: DC
  Filled 2017-08-20 (×2): qty 1000

## 2017-08-20 MED ORDER — ROCURONIUM BROMIDE 50 MG/5ML IV SOSY
PREFILLED_SYRINGE | INTRAVENOUS | Status: DC | PRN
  Administered 2017-08-20: 40 mg via INTRAVENOUS
  Administered 2017-08-20: 10 mg via INTRAVENOUS

## 2017-08-20 MED ORDER — HEPARIN SODIUM (PORCINE) 5000 UNIT/ML IJ SOLN: 5000.0000 [IU] | Freq: Three times a day (TID) | INTRAMUSCULAR | Status: DC

## 2017-08-20 MED ORDER — CEFOTETAN DISODIUM-DEXTROSE 2-2.08 GM-%(50ML) IV SOLR
2.0000 g | INTRAVENOUS | Status: AC
  Administered 2017-08-20: 2 g via INTRAVENOUS
  Filled 2017-08-20: qty 50

## 2017-08-20 MED ORDER — SUCCINYLCHOLINE CHLORIDE 200 MG/10ML IV SOSY
PREFILLED_SYRINGE | INTRAVENOUS | Status: DC | PRN
  Administered 2017-08-20: 140 mg via INTRAVENOUS

## 2017-08-20 MED ORDER — PANTOPRAZOLE SODIUM 40 MG IV SOLR
40.0000 mg | Freq: Every day | INTRAVENOUS | Status: DC
  Administered 2017-08-20: 40 mg via INTRAVENOUS
  Filled 2017-08-20: qty 40

## 2017-08-20 MED ORDER — SODIUM CHLORIDE 0.9 % IJ SOLN
INTRAMUSCULAR | Status: AC
Start: 2017-08-20 — End: ?
  Filled 2017-08-20: qty 50

## 2017-08-20 MED ORDER — DEXAMETHASONE SODIUM PHOSPHATE 10 MG/ML IJ SOLN
INTRAMUSCULAR | Status: AC
  Filled 2017-08-20: qty 1

## 2017-08-20 MED ORDER — CHLORHEXIDINE GLUCONATE CLOTH 2 % EX PADS: 6.0000 | MEDICATED_PAD | Freq: Once | CUTANEOUS | Status: DC

## 2017-08-20 MED ORDER — HEPARIN SODIUM (PORCINE) 5000 UNIT/ML IJ SOLN
INTRAMUSCULAR | Status: AC
  Filled 2017-08-20: qty 1

## 2017-08-20 MED ORDER — MIDAZOLAM HCL 2 MG/2ML IJ SOLN
INTRAMUSCULAR | Status: AC
  Filled 2017-08-20: qty 2

## 2017-08-20 MED ORDER — ONDANSETRON 4 MG PO TBDP: 4.0000 mg | ORAL_TABLET | Freq: Four times a day (QID) | ORAL | Status: DC | PRN

## 2017-08-20 MED ORDER — LACTATED RINGERS IV SOLN
INTRAVENOUS | Status: DC
  Administered 2017-08-20: 09:00:00 via INTRAVENOUS

## 2017-08-20 MED ORDER — SUCCINYLCHOLINE CHLORIDE 200 MG/10ML IV SOSY
PREFILLED_SYRINGE | INTRAVENOUS | Status: AC
  Filled 2017-08-20: qty 10

## 2017-08-20 MED ORDER — OXYCODONE HCL 5 MG/5ML PO SOLN
5.0000 mg | Freq: Once | ORAL | Status: DC | PRN
  Filled 2017-08-20: qty 5

## 2017-08-20 MED ORDER — HYDROMORPHONE HCL 1 MG/ML IJ SOLN
INTRAMUSCULAR | Status: AC
  Filled 2017-08-20: qty 2

## 2017-08-20 MED ORDER — SUGAMMADEX SODIUM 200 MG/2ML IV SOLN
INTRAVENOUS | Status: DC | PRN
  Administered 2017-08-20: 200 mg via INTRAVENOUS

## 2017-08-20 MED ORDER — ONDANSETRON HCL 4 MG/2ML IJ SOLN: 4.0000 mg | Freq: Four times a day (QID) | INTRAMUSCULAR | Status: DC | PRN

## 2017-08-20 MED ORDER — FENTANYL CITRATE (PF) 250 MCG/5ML IJ SOLN
INTRAMUSCULAR | Status: AC
  Filled 2017-08-20: qty 5

## 2017-08-20 MED ORDER — LIDOCAINE 2% (20 MG/ML) 5 ML SYRINGE
INTRAMUSCULAR | Status: AC
  Filled 2017-08-20: qty 5

## 2017-08-20 MED ORDER — PROPOFOL 10 MG/ML IV BOLUS
INTRAVENOUS | Status: DC | PRN
  Administered 2017-08-20: 200 mg via INTRAVENOUS

## 2017-08-20 MED ORDER — HEPARIN SODIUM (PORCINE) 5000 UNIT/ML IJ SOLN
5000.0000 [IU] | Freq: Once | INTRAMUSCULAR | Status: AC
Start: 2017-08-20 — End: 2017-08-20
  Administered 2017-08-20: 5000 [IU] via SUBCUTANEOUS

## 2017-08-20 MED ORDER — MIDAZOLAM HCL 5 MG/5ML IJ SOLN
INTRAMUSCULAR | Status: DC | PRN
  Administered 2017-08-20: 2 mg via INTRAVENOUS

## 2017-08-20 MED ORDER — SUGAMMADEX SODIUM 500 MG/5ML IV SOLN
INTRAVENOUS | Status: AC
  Filled 2017-08-20: qty 5

## 2017-08-20 MED ORDER — LIDOCAINE 2% (20 MG/ML) 5 ML SYRINGE
INTRAMUSCULAR | Status: DC | PRN
  Administered 2017-08-20: 100 mg via INTRAVENOUS

## 2017-08-20 MED ORDER — SODIUM CHLORIDE 0.9 % IJ SOLN
INTRAMUSCULAR | Status: DC | PRN
  Administered 2017-08-20: 50 mL

## 2017-08-20 MED ORDER — OXYCODONE HCL 5 MG PO TABS: 5.0000 mg | ORAL_TABLET | Freq: Once | ORAL | Status: DC | PRN

## 2017-08-20 MED ORDER — BUPIVACAINE-EPINEPHRINE 0.25% -1:200000 IJ SOLN
INTRAMUSCULAR | Status: AC
  Filled 2017-08-20: qty 1

## 2017-08-20 MED ORDER — BUPIVACAINE LIPOSOME 1.3 % IJ SUSP
20.0000 mL | Freq: Once | INTRAMUSCULAR | Status: AC
  Administered 2017-08-20: 20 mL
  Filled 2017-08-20: qty 20

## 2017-08-20 MED ORDER — BUPIVACAINE-EPINEPHRINE (PF) 0.25% -1:200000 IJ SOLN
INTRAMUSCULAR | Status: DC | PRN
  Administered 2017-08-20: 20 mL

## 2017-08-20 SURGICAL SUPPLY — 43 items
BIOPATCH WHT 1IN DISK W/4.0 H (GAUZE/BANDAGES/DRESSINGS) ×2 IMPLANT
CABLE HIGH FREQUENCY MONO STRZ (ELECTRODE) ×2 IMPLANT
DECANTER SPIKE VIAL GLASS SM (MISCELLANEOUS) ×4 IMPLANT
DERMABOND ADVANCED (GAUZE/BANDAGES/DRESSINGS) ×1
DERMABOND ADVANCED .7 DNX12 (GAUZE/BANDAGES/DRESSINGS) ×1 IMPLANT
DRAIN CHANNEL 19F RND (DRAIN) ×2 IMPLANT
DRSG TEGADERM 2-3/8X2-3/4 SM (GAUZE/BANDAGES/DRESSINGS) ×2 IMPLANT
ELECT PENCIL ROCKER SW 15FT (MISCELLANEOUS) ×2 IMPLANT
ELECT REM PT RETURN 15FT ADLT (MISCELLANEOUS) ×2 IMPLANT
EVACUATOR DRAINAGE 10X20 100CC (DRAIN) ×1 IMPLANT
EVACUATOR SILICONE 100CC (DRAIN) ×1
GLOVE BIOGEL PI IND STRL 7.5 (GLOVE) ×1 IMPLANT
GLOVE BIOGEL PI INDICATOR 7.5 (GLOVE) ×1
GLOVE ECLIPSE 7.5 STRL STRAW (GLOVE) ×2 IMPLANT
GOWN STRL REUS W/TWL XL LVL3 (GOWN DISPOSABLE) ×6 IMPLANT
HOVERMATT SINGLE USE (MISCELLANEOUS) IMPLANT
KIT BASIN OR (CUSTOM PROCEDURE TRAY) ×2 IMPLANT
NEEDLE SPNL 22GX3.5 QUINCKE BK (NEEDLE) IMPLANT
NS IRRIG 1000ML POUR BTL (IV SOLUTION) ×2 IMPLANT
SCISSORS LAP 5X35 DISP (ENDOMECHANICALS) ×2 IMPLANT
SET IRRIG TUBING LAPAROSCOPIC (IRRIGATION / IRRIGATOR) IMPLANT
SHEARS HARMONIC ACE PLUS 36CM (ENDOMECHANICALS) IMPLANT
SLEEVE ADV FIXATION 5X100MM (TROCAR) IMPLANT
SLEEVE XCEL OPT CAN 5 100 (ENDOMECHANICALS) IMPLANT
SOLUTION ANTI FOG 6CC (MISCELLANEOUS) ×2 IMPLANT
SPONGE DRAIN TRACH 4X4 STRL 2S (GAUZE/BANDAGES/DRESSINGS) ×2 IMPLANT
SUT MNCRL AB 4-0 PS2 18 (SUTURE) ×2 IMPLANT
SUT NYLON 3 0 (SUTURE) ×2 IMPLANT
SUT VIC AB 2-0 SH 27 (SUTURE) ×1
SUT VIC AB 2-0 SH 27X BRD (SUTURE) ×1 IMPLANT
SUT VICRYL 0 UR6 27IN ABS (SUTURE) ×2 IMPLANT
SYR 10ML ECCENTRIC (SYRINGE) ×2 IMPLANT
SYR 20CC LL (SYRINGE) ×2 IMPLANT
TIP RIGID 35CM EVICEL (HEMOSTASIS) ×2 IMPLANT
TOWEL OR 17X26 10 PK STRL BLUE (TOWEL DISPOSABLE) ×2 IMPLANT
TOWEL OR NON WOVEN STRL DISP B (DISPOSABLE) ×2 IMPLANT
TRAY LAP CHOLE (CUSTOM PROCEDURE TRAY) ×2 IMPLANT
TROCAR ADV FIXATION 11X100MM (TROCAR) IMPLANT
TROCAR ADV FIXATION 5X100MM (TROCAR) IMPLANT
TROCAR BLADELESS 15MM (ENDOMECHANICALS) ×2 IMPLANT
TROCAR BLADELESS OPT 5 100 (ENDOMECHANICALS) ×2 IMPLANT
TROCAR XCEL NON-BLD 11X100MML (ENDOMECHANICALS) IMPLANT
TUBING INSUF HEATED (TUBING) ×2 IMPLANT

## 2017-08-20 NOTE — Anesthesia Preprocedure Evaluation (Signed)
Anesthesia Evaluation  Patient identified by MRN, date of birth, ID band Patient awake    Reviewed: Allergy & Precautions, NPO status , Patient's Chart, lab work & pertinent test results  Airway Mallampati: II   Neck ROM: Full    Dental no notable dental hx.    Pulmonary former smoker,    breath sounds clear to auscultation       Cardiovascular hypertension,  Rhythm:Regular Rate:Normal     Neuro/Psych    GI/Hepatic negative GI ROS, Neg liver ROS,   Endo/Other  negative endocrine ROS  Renal/GU negative Renal ROS     Musculoskeletal   Abdominal   Peds  Hematology   Anesthesia Other Findings   Reproductive/Obstetrics                             Anesthesia Physical Anesthesia Plan  ASA: II  Anesthesia Plan: General   Post-op Pain Management:    Induction: Intravenous  PONV Risk Score and Plan: 4 or greater and Ondansetron, Dexamethasone, Midazolam, Scopolamine patch - Pre-op, Propofol infusion and Treatment may vary due to age or medical condition  Airway Management Planned: Oral ETT  Additional Equipment:   Intra-op Plan:   Post-operative Plan: Extubation in OR  Informed Consent: I have reviewed the patients History and Physical, chart, labs and discussed the procedure including the risks, benefits and alternatives for the proposed anesthesia with the patient or authorized representative who has indicated his/her understanding and acceptance.   Dental advisory given  Plan Discussed with: CRNA  Anesthesia Plan Comments:         Anesthesia Quick Evaluation

## 2017-08-20 NOTE — Transfer of Care (Signed)
Immediate Anesthesia Transfer of Care Note  Patient: Sharon Fowler  Procedure(s) Performed: Procedure(s): LAPAROSCOPIC REMOVAL OF GASTRIC BAND (N/A)  Patient Location: PACU  Anesthesia Type:General  Level of Consciousness:  sedated, patient cooperative and responds to stimulation  Airway & Oxygen Therapy:Patient Spontanous Breathing and Patient connected to face mask oxgen  Post-op Assessment:  Report given to PACU RN and Post -op Vital signs reviewed and stable  Post vital signs:  Reviewed and stable  Last Vitals:  Vitals:   08/20/17 0746  BP: (!) 157/117  Pulse: 80  Resp: 18  Temp: 36.8 C  SpO2: 389%    Complications: No apparent anesthesia complications

## 2017-08-20 NOTE — H&P (Signed)
History of Present Illness Marland Kitchen T. Vrinda Heckstall MD; 08/15/2017 3:58 PM)  Sharon Fowler is a 46 year old patient who received an AP-Standard lap band in December 2011. I last saw her in January of this year when she had some lack of restriction and we did a small fill. She was also having some intermittent crampy left upper quadrant abdominal pain that seemed musculoskeletal. She said this has been on and off but not as bad. She did experience some increased restriction with the filling January and has lost 35 pounds for now total weight loss of 72 pounds from surgery. However last week she had the fairly sudden onset, 1 week ago, of left flank and left upper quadrant pain. She describes fairly severe aching pain. Often worse with movement or deep breathing. She went to urgent care and subsequently to the emergency room. A CT scan was obtained showing some possible inflammation along the band tubing, very minimal in the upper abdomen along the anterior abdominal wall but the band itself unremarkable. I reviewed this with Dr. Lucia Gaskins we do not see any definite inflammation or evidence of erosion. Nothing else remarkable on CT. Lab work showed a possible UTI with red and white cells in her urine and she was given a course of antibiotics which she is just finishing. White blood count was 10,000. She states the pain may be a little better but is persistent and significant.  With these findings we elected to proceed with upper endoscopy which was performed by Dr. Lucia Gaskins on 08/16/2017 which revealed an obvious erosion of her band. With this finding we have of course recommended proceeding with laparoscopic removal of her lap band and component since she presents for this procedure.   Problem List/Past Medical Marland Kitchen T. Daleen Steinhaus, MD; 08/15/2017 3:58 PM) MORBID OBESITY WITH BMI OF 45.0-49.9, ADULT (E66.01)  ENCOUNTER FOR FITTING AND ADJUSTMENT OF GASTRIC LAP BAND (Z46.51)  GASTRIC BANDING STATUS  (Z98.84)  LEFT UPPER QUADRANT PAIN (R10.12)   Past Surgical History Marland Kitchen T. Ahliya Glatt, MD; 08/15/2017 3:58 PM) Lap Band  Gallbladder Surgery - Laparoscopic  Resection of Stomach  Oral Surgery  Cesarean Section - Multiple   Diagnostic Studies History Marland Kitchen T. Alfonzo Arca, MD; 08/15/2017 3:58 PM) Mammogram  within last year Colonoscopy  never Pap Smear  1-5 years ago  Allergies Malachy Moan, RMA; 08/15/2017 3:20 PM) Chlorthalidone *DIURETICS*   Medication History Malachy Moan, RMA; 08/15/2017 3:21 PM) Cyanocobalamin (Oral) Specific strength unknown - Active. No Current Medications (Taken starting 08/15/2017) Multivitamins (Oral) Active. Cephalexin (500MG Capsule, Oral) Active. Lisinopril (10MG Tablet, Oral) Active. Atorvastatin Calcium (10MG Tablet, Oral) Active. Medications Reconciled  Social History Marland Kitchen T. Anaisha Mago, MD; 08/15/2017 3:58 PM) No caffeine use  Alcohol use  Occasional alcohol use. Tobacco use  Former smoker. No drug use   Family History Marland Kitchen T. Lamon Rotundo, MD; 08/15/2017 3:58 PM) Alcohol Abuse  Father, Mother. Heart disease in female family member before age 8  Heart disease in female family member before age 106  Respiratory Condition  Mother. Hypertension  Mother. Cerebrovascular Accident  Mother. Arthritis  Mother. Heart Disease  Family Members In General. Diabetes Mellitus  Family Members In General, Mother, Sister.  Pregnancy / Birth History Marland Kitchen T. Cristy Colmenares, MD; 08/15/2017 3:58 PM) Age at menarche  57 years. Irregular periods  Gravida  5 Para  5 Maternal age  28-20  Other Problems Marland Kitchen T. Kaedyn Polivka, MD; 08/15/2017 3:58 PM) High blood pressure  Hemorrhoids  Thyroid Disease  Hypercholesterolemia  Cholelithiasis   Vitals Holley Dexter  Lambert RMA; 08/15/2017 3:21 PM) 08/15/2017 3:21 PM Weight: 216 lb Height: 64in Body Surface Area: 2.02 m Body Mass Index: 37.08 kg/m   Temp.: 97.9F  Pulse: 82 (Regular)  BP: 120/86 (Sitting, Left Arm, Standard)       Physical Exam Marland Kitchen T. Aziel Morgan MD; 08/15/2017 3:59 PM) The physical exam findings are as follows: Note:General: overweight African American female who appears uncomfortable in the office and painful moving about. Skin: No rash or infection HEENT: No palpable masses or adenopathy Lungs: Clear respirations bilaterally although some pain with deep breathing Cardiac: Regular rate and rhythm. No edema. Abdomen: There is moderate tenderness in the left upper quadrant and epigastrium. No peritoneal signs. Also some tenderness along the costal margin on the left. Port site in the right abdomen is unremarkable and nontender. No hernias. Extremities: No edema or deformity Neurologic: Alert and fully oriented. Gait normal. Affect normal.    Assessment & Plan Marland Kitchen T. Lynsay Fesperman MD; 08/15/2017 4:02 PM)   Patient status post lap band placement 2011 who now presents with persistent left upper quadrant abdominal pain CT scan showing some subtle inflammatory changes along her lap band tubing and upper endoscopy by Dr. Lucia Gaskins showing an obvious erosion of her band. I recommended the patient removal of the lap band and components laparoscopically. We discussed the indications and nature of surgery and expected recovery. I discussed that a drain would be left in place. I discussed complications including anesthetic risks, bleeding, infection, injury to adjacent structures and persistent leakage from her stomach. All her questions were answered and she agrees to proceed.  Edward Jolly MD, FACS  08/20/2017, 9:00 AM

## 2017-08-20 NOTE — Progress Notes (Signed)
Patient has a dermal piercing in her left chest. Piercing covered with paper tape. Notified staff downstairs in the OR.

## 2017-08-20 NOTE — Op Note (Signed)
Preoperative Diagnosis: Erosion of Lap Band  Postoprative Diagnosis: Erosion of Lap Band  Procedure: Procedure(s): LAPAROSCOPIC REMOVAL OF GASTRIC BAND   Surgeon: Excell Seltzer T   Assistants: Jens Som  Anesthesia:  General endotracheal anesthesia  Indications: Patient is a 46 year old female status post lap band placement for morbid obesity in 2011. She has a 71 pound weight loss. However she presents with 1 week of persistent left upper quadrant and left flank pain with CT scan showing some slight inflammatory change along the lap band tubing. Upper endoscopy performed by Dr. Lucia Gaskins shows erosion of approximately 50% of the band into the gastric lumen. With these findings I have recommended laparoscopic removal of the lap band and components. We discussed the procedure and indications and risks detailed elsewhere and she agrees to proceed.    Procedure Detail: Patient was brought to the operating room, placed in the supine position on the operating table, and general endotracheal anesthesia induced. She received broad-spectrum preoperative IV antibiotics. Subcutaneous heparin was given. PAS were in place. The abdomen was widely sterilely prepped and draped. Patient timeout was performed and correct procedure verified. Access was obtained with a 5 mm Optiview trocar in the left upper quadrant and pneumoperitoneum established. Laparoscopy showed significant omental adhesions to the anterior abdominal wall which appeared to be around the lap band tubing. A lot of these were easily taken down bluntly dissecting with the camera opening up space into the right upper quadrant where under direct vision I placed a 5 mm trocar laterally and a 15 mm trocar medially in the right upper quadrant at the port site. Further anterior abdominal wall omental adhesions were taken down which were all around the tubing. A camera port was placed above and to the left of the umbilicus. The patient was placed in  reverse Trendelenburg with good exposure of the upper abdomen. The left lobe of liver was elevated and adhesions underneath the left lobe were taken down using hook cautery and blunt dissection. The lap band tubing and lap band could be felt beneath omental adhesions. I dissected the tubing free from omental adhesions just prior to the hub of the catheter. The tubing was divided at this point in the hub grasped and then I dissected with cautery directly along the top of the lobe and the band through dense adhesions. I was able to expose the buckle and a small portion of the band.  At this point I was able to the band and then sharply divided and it was removed easily from its track into pieces and removed through the 15 mm trocar. The remainder of the intra-abdominal portion of the tubing was divided at the abdominal wall and removed through the 15 mm trocar. The upper abdomen was thoroughly irrigated. I could see the track of the band where it had eroded apparently posteriorly but there was no gastric leakage apparent. Evicel was placed around the upper portion of the stomach. A 19 Blake closed suction drain was left underneath the left lobe of the liver and brought out through the lateral left trocar site. A bilateral TA PP block was performed with dilute Exparel. The trochars were removed and all CO2 evacuated. The incision at the port site was lengthened slightly and the port and remainder of the tubing were completely dissected free from subcutaneous tissue and removed. The small fascial defect was closed with a single suture of 0 Vicryl. Skin incisions were closed with subcuticular Monocryl and Dermabond. Sponge needle and instrument counts were  correct.    Findings: As above  Estimated Blood Loss:  less than 50 mL         Drains: 19 Blake drain in left upper quadrant  Blood Given: none          Specimens: None        Complications:  * No complications entered in OR log *         Disposition:  PACU - hemodynamically stable.         Condition: stable

## 2017-08-20 NOTE — Interval H&P Note (Signed)
History and Physical Interval Note:  08/20/2017 9:03 AM  Sharon Fowler  has presented today for surgery, with the diagnosis of Erosion of Lap Band  The various methods of treatment have been discussed with the patient and family. After consideration of risks, benefits and other options for treatment, the patient has consented to  Procedure(s): LAPAROSCOPIC REMOVAL OF GASTRIC BAND (N/A) as a surgical intervention .  The patient's history has been reviewed, patient examined, no change in status, stable for surgery.  I have reviewed the patient's chart and labs.  Questions were answered to the patient's satisfaction.     [name]

## 2017-08-20 NOTE — Anesthesia Procedure Notes (Signed)
Procedure Name: Intubation Date/Time: 08/20/2017 9:45 AM Performed by: Maxwell Caul Pre-anesthesia Checklist: Patient identified, Emergency Drugs available, Suction available and Patient being monitored Patient Re-evaluated:Patient Re-evaluated prior to induction Oxygen Delivery Method: Circle system utilized Preoxygenation: Pre-oxygenation with 100% oxygen Induction Type: IV induction Laryngoscope Size: Mac and 4 Grade View: Grade I Tube type: Oral Tube size: 7.5 mm Number of attempts: 1 Airway Equipment and Method: Stylet Placement Confirmation: ETT inserted through vocal cords under direct vision,  positive ETCO2 and breath sounds checked- equal and bilateral Secured at: 21 cm Tube secured with: Tape Dental Injury: Teeth and Oropharynx as per pre-operative assessment

## 2017-08-21 LAB — COMPREHENSIVE METABOLIC PANEL
ALK PHOS: 77 U/L (ref 38–126)
ALT: 28 U/L (ref 14–54)
AST: 36 U/L (ref 15–41)
Albumin: 3.2 g/dL — ABNORMAL LOW (ref 3.5–5.0)
Anion gap: 10 (ref 5–15)
BILIRUBIN TOTAL: 0.5 mg/dL (ref 0.3–1.2)
BUN: 7 mg/dL (ref 6–20)
CALCIUM: 8.9 mg/dL (ref 8.9–10.3)
CO2: 22 mmol/L (ref 22–32)
CREATININE: 0.51 mg/dL (ref 0.44–1.00)
Chloride: 106 mmol/L (ref 101–111)
GFR calc Af Amer: >60 mL/min (ref >60)
Glucose, Bld: 84 mg/dL (ref 65–99)
Potassium: 4 mmol/L (ref 3.5–5.1)
Sodium: 138 mmol/L (ref 135–145)
TOTAL PROTEIN: 6.5 g/dL (ref 6.5–8.1)

## 2017-08-21 LAB — CBC
HCT: 36.3 % (ref 36.0–46.0)
Hemoglobin: 11.9 g/dL — ABNORMAL LOW (ref 12.0–15.0)
MCH: 27.1 pg (ref 26.0–34.0)
MCHC: 32.8 g/dL (ref 30.0–36.0)
MCV: 82.7 fL (ref 78.0–100.0)
PLATELETS: 337 10*3/uL (ref 150–400)
RBC: 4.39 MIL/uL (ref 3.87–5.11)
RDW: 14.4 % (ref 11.5–15.5)
WBC: 10.6 10*3/uL — AB (ref 4.0–10.5)

## 2017-08-21 MED ORDER — SIMETHICONE 40 MG/0.6ML PO SUSP
40.0000 mg | Freq: Four times a day (QID) | ORAL | Status: DC | PRN
  Administered 2017-08-21: 40 mg via ORAL
  Filled 2017-08-21: qty 0.6

## 2017-08-21 MED ORDER — OXYCODONE HCL 5 MG/5ML PO SOLN
5.0000 mg | ORAL | Status: DC | PRN
  Administered 2017-08-21 – 2017-08-22 (×2): 5 mg via ORAL
  Filled 2017-08-21 (×3): qty 5

## 2017-08-21 NOTE — Progress Notes (Signed)
Patient ID: Sharon Fowler, female   DOB: [birth year], 46 y.o.   MRN: [id number] 1 Day Post-Op   Subjective: Some left upper quadrant pain, no worse than before surgery. No nausea. Ambulatory. Minimal drain output this morning.  Objective: Vital signs in last 24 hours: Temp:  [97.8 F (36.6 C)-99.3 F (37.4 C)] 99.1 F (37.3 C) (10/23 1002) Pulse Rate:  [72-95] 74 (10/23 1002) Resp:  [11-19] 16 (10/23 1002) BP: (127-189)/(78-111) 134/82 (10/23 1002) SpO2:  [98 %-100 %] 100 % (10/23 1002) Last BM Date: 08/18/17  Intake/Output from previous day: 10/22 0701 - 10/23 0700 In: 2500 [I.V.:2500] Out: 2795 [Urine:2650; Drains:125; Blood:20] Intake/Output this shift: Total I/O In: 0  Out: 215 [Urine:200; Drains:15]  General appearance: alert, cooperative and no distress GI: Mild left upper quadrant tenderness Incision/Wound: Clean and dry. JP drainage serosanguineous.  Lab Results:   Recent Labs  08/21/17 0432  WBC 10.6*  HGB 11.9*  HCT 36.3  PLT 337   BMET  Recent Labs  08/21/17 0432  NA 138  K 4.0  CL 106  CO2 22  GLUCOSE 84  BUN 7  CREATININE 0.51  CALCIUM 8.9     Studies/Results: No results found.  Anti-infectives: Anti-infectives    Start     Dose/Rate Route Frequency Ordered Stop   08/20/17 0743  cefoTEtan in Dextrose 5% (CEFOTAN) IVPB 2 g     2 g Intravenous On call to O.R. 08/20/17 0743 08/20/17 1007      Assessment/Plan: s/p Procedure(s): LAPAROSCOPIC REMOVAL OF GASTRIC BAND Doing well postoperatively. Minimal JP drainage I will start clear liquid diet. Possible discharge tomorrow.   LOS: 1 day    Bralee Feldt T 08/21/2017

## 2017-08-21 NOTE — Progress Notes (Signed)
Introduced self to patient and provided contact information.  Patient had questions regarding dietician appointments.  Discussed steps to conversion with patient after patient had questions regarding procedure.

## 2017-08-21 NOTE — Anesthesia Postprocedure Evaluation (Signed)
Anesthesia Post Note  Patient: Sharon Fowler  Procedure(s) Performed: LAPAROSCOPIC REMOVAL OF GASTRIC BAND (N/A Abdomen)     Patient location during evaluation: PACU Anesthesia Type: General Level of consciousness: awake and sedated Pain management: pain level controlled Vital Signs Assessment: post-procedure vital signs reviewed and stable Respiratory status: spontaneous breathing, nonlabored ventilation, respiratory function stable and patient connected to nasal cannula oxygen Cardiovascular status: blood pressure returned to baseline and stable Postop Assessment: no apparent nausea or vomiting Anesthetic complications: no    Last Vitals:  Vitals:   08/21/17 1002 08/21/17 1426  BP: 134/82 135/88  Pulse: 74 89  Resp: 16 16  Temp: 37.3 C 36.9 C  SpO2: 100% 100%    Last Pain:  Vitals:   08/21/17 1426  TempSrc: Oral  PainSc:                  Ronnica Dreese,JAMES TERRILL

## 2017-08-21 NOTE — Consult Note (Signed)
   [facility] CM Inpatient Consult   08/21/2017  Sharon Fowler [facility] 1970/11/27 [id number]    Spoke with [name] at bedside on behalf of Link to [facility] Care Management program for [facility] employees/dependents with [facility] insurance. Denies having any Link to Wellness needs at this time.   Agreeable to post hospital discharge call. Confirmed best contact number as [phone].  Provided 24-hr nurse line magnet, contact information, and Link to [employer].   Inpatient RNCM aware of visit.    Marthenia Rolling, MSN-Ed, RN,BSN [facility] Liaison [phone]

## 2017-08-22 LAB — CBC
HCT: 34.9 % — ABNORMAL LOW (ref 36.0–46.0)
Hemoglobin: 11.4 g/dL — ABNORMAL LOW (ref 12.0–15.0)
MCH: 27.2 pg (ref 26.0–34.0)
MCHC: 32.7 g/dL (ref 30.0–36.0)
MCV: 83.3 fL (ref 78.0–100.0)
PLATELETS: 307 10*3/uL (ref 150–400)
RBC: 4.19 MIL/uL (ref 3.87–5.11)
RDW: 15 % (ref 11.5–15.5)
WBC: 9.1 10*3/uL (ref 4.0–10.5)

## 2017-08-22 MED ORDER — ACETAMINOPHEN 160 MG/5ML PO SOLN
650.0000 mg | Freq: Once | ORAL | Status: AC
  Administered 2017-08-22: 650 mg via ORAL
  Filled 2017-08-22: qty 20.3

## 2017-08-22 MED ORDER — OXYCODONE HCL 5 MG/5ML PO SOLN: 5.0000 mg | Freq: Four times a day (QID) | ORAL | 0 refills | Status: DC | PRN

## 2017-08-22 MED FILL — oxyCODONE HCL 5 MG/5ML SOLN: 5 | 5 days supply | Qty: 100 | Fill #0

## 2017-08-22 NOTE — Discharge Instructions (Signed)
[facility] ______[facility] °LAPAROSCOPIC SURGERY: POST OP INSTRUCTIONS °Always review your discharge instruction sheet given to you by the facility where your surgery was performed. °IF YOU HAVE DISABILITY OR FAMILY LEAVE FORMS, YOU MUST BRING THEM TO THE OFFICE FOR PROCESSING.   °DO NOT GIVE THEM TO YOUR DOCTOR. ° °1. A prescription for pain medication may be given to you upon discharge.  Take your pain medication as prescribed, if needed.  If narcotic pain medicine is not needed, then you may take acetaminophen (Tylenol) or ibuprofen (Advil) as needed. °2. Take your usually prescribed medications unless otherwise directed. °3. If you need a refill on your pain medication, please contact your pharmacy.  They will contact our office to request authorization. Prescriptions will not be filled after 5pm or on week-ends. °4. You should follow a light diet the first few days after arrival home, such as soup and crackers, etc.  Be sure to include lots of fluids daily. °5. Most patients will experience some swelling and bruising in the area of the incisions.  Ice packs will help.  Swelling and bruising can take several days to resolve.  °6. It is common to experience some constipation if taking pain medication after surgery.  Increasing fluid intake and taking a stool softener (such as Colace) will usually help or prevent this problem from occurring.  A mild laxative (Milk of Magnesia or Miralax) should be taken according to package instructions if there are no bowel movements after 48 hours. °7. Unless discharge instructions indicate otherwise, you may remove your bandages 24-48 hours after surgery, and you may shower at that time.  You may have steri-strips (small skin tapes) in place directly over the incision.  These strips should be left on the skin for 7-10 days.  If your surgeon used skin glue on the incision, you may shower in 24 hours.  The glue will flake off over the next 2-3 weeks.  Any sutures or  staples will be removed at the office during your follow-up visit. °8. ACTIVITIES:  You may resume regular (light) daily activities beginning the next day--such as daily self-care, walking, climbing stairs--gradually increasing activities as tolerated.  You may have sexual intercourse when it is comfortable.  Refrain from any heavy lifting or straining until approved by your doctor. °a. You may drive when you are no longer taking prescription pain medication, you can comfortably wear a seatbelt, and you can safely maneuver your car and apply brakes. °b. RETURN TO WORK:  __________________________________________________________ °9. You should see your doctor in the office for a follow-up appointment approximately 2-3 weeks after your surgery.  Make sure that you call for this appointment within a day or two after you arrive home to insure a convenient appointment time. °10. OTHER INSTRUCTIONS: __________________________________________________________________________________________________________________________ __________________________________________________________________________________________________________________________ °WHEN TO CALL YOUR DOCTOR: °1. Fever over 101.0 °2. Inability to urinate °3. Continued bleeding from incision. °4. Increased pain, redness, or drainage from the incision. °5. Increasing abdominal pain ° °The clinic staff is available to answer your questions during regular business hours.  Please don’t hesitate to call and ask to speak to one of the nurses for clinical concerns.  If you have a medical emergency, go to the nearest emergency room or call 911.  A surgeon from [facility] is always on call at the hospital. °[address], [address], [place], [place]  [address] ? [address] [address], [place], [place]   [address] °[phone] ? [phone] ? FAX [phone] °Web site:   [email or web address] °

## 2017-08-22 NOTE — Discharge Summary (Signed)
  Patient ID: Sharon Fowler [id number] 45 y.o. February 14, 1971  08/20/2017  Discharge date and time: 08/22/2017   Admitting Physician: Excell Seltzer T  Discharge Physician: Excell Seltzer T  Admission Diagnoses: Erosion of Lap Band  Discharge Diagnoses: same  Operations: Procedure(s): LAPAROSCOPIC REMOVAL OF GASTRIC BAND  Admission Condition: fair  Discharged Condition: good  Indication for Admission: patient has history of lap band placement 2011 with weight loss of about 70 pounds. She recently presented with persistent left upper quadrant abdominal pain. Ultimately upper endoscopy showed erosion of her lap band. She is admitted for removal of lap band and components.  Hospital Course: on the day of admission the patient underwent laparoscopic removal of her lap band and components. JP drain was left in the upper abdomen. Her postoperative course was uncomplicated.her pain quickly improved. She had minimal JP drainage. She is started on clear liquid diet on the first postoperative day and tolerated this well.White blood count decreased to normal. She is felt ready for discharge on the second postoperative day. JP drain remains in place and will be removed at her first office visit next week.   Disposition: Home  Patient Instructions:  Allergies as of 08/22/2017      Reactions   Chlorthalidone Other (See Comments)   REACTION: hypokalemia (2.8)      Medication List    STOP taking these medications   cephALEXin 500 MG capsule Commonly known as:  KEFLEX   HYDROcodone-acetaminophen 5-325 MG tablet Commonly known as:  NORCO/VICODIN   ibuprofen 200 MG tablet Commonly known as:  ADVIL,MOTRIN   naproxen 500 MG tablet Commonly known as:  NAPROSYN   ondansetron 8 MG disintegrating tablet Commonly known as:  ZOFRAN-ODT     TAKE these medications   atorvastatin 10 MG tablet Commonly known as:  LIPITOR Take 1 tablet (10 mg total) by mouth daily. What changed:   when to take this   B-12 PO Take 1 tablet by mouth daily.   cyclobenzaprine 10 MG tablet Commonly known as:  FLEXERIL Take 0.5-1 tablets (5-10 mg total) by mouth 3 (three) times daily as needed for muscle spasms.   lisinopril 10 MG tablet Commonly known as:  PRINIVIL,ZESTRIL Take 1 tablet (10 mg total) by mouth daily. What changed:  when to take this   multivitamin with minerals Tabs tablet Take 1 tablet by mouth daily.   oxyCODONE 5 MG/5ML solution Commonly known as:  ROXICODONE Take 5 mLs (5 mg total) by mouth every 6 (six) hours as needed for moderate pain.       Activity: activity as tolerated Diet: liquid diet for 2 days then advance to soft as tolerated Wound Care: empty JP drain daily  Follow-up:  With Dr. Excell Seltzer in 1 week.  Signed: Edward Jolly MD, FACS  08/22/2017, 8:31 AM

## 2017-08-22 NOTE — Progress Notes (Signed)
Patient ID: Sharon Fowler, female   DOB: Aug 02, 1971, 46 y.o.   MRN: [id number] 2 Days Post-Op   Subjective: Feels much better today. Just sore. Feels better than before surgery. Tolerating clear liquids without difficulty.  Objective: Vital signs in last 24 hours: Temp:  [98.5 F (36.9 C)-101.4 F (38.6 C)] 99.8 F (37.7 C) (10/24 0530) Pulse Rate:  [74-113] 97 (10/24 0530) Resp:  [16-18] 18 (10/24 0530) BP: (120-135)/(76-99) 123/77 (10/24 0530) SpO2:  [98 %-100 %] 98 % (10/24 0530) Last BM Date: 08/18/17  Intake/Output from previous day: 10/23 0701 - 10/24 0700 In: 2525 [P.O.:1645; I.V.:800; IV Piggyback:80] Out: 2957 [Urine:2875; Drains:82] Intake/Output this shift: No intake/output data recorded.  General appearance: alert, cooperative and no distress GI: minimal epigastric tenderness without guarding Incision/Wound: clean and dry. JP drainage serosanguineous and minimal  Lab Results:   Recent Labs  08/21/17 0432 08/22/17 0522  WBC 10.6* 9.1  HGB 11.9* 11.4*  HCT 36.3 34.9*  PLT 337 307   BMET  Recent Labs  08/21/17 0432  NA 138  K 4.0  CL 106  CO2 22  GLUCOSE 84  BUN 7  CREATININE 0.51  CALCIUM 8.9     Studies/Results: No results found.  Anti-infectives: Anti-infectives    Start     Dose/Rate Route Frequency Ordered Stop   08/20/17 0743  cefoTEtan in Dextrose 5% (CEFOTAN) IVPB 2 g     2 g Intravenous On call to O.R. 08/20/17 0743 08/20/17 1007      Assessment/Plan: s/p Procedure(s): LAPAROSCOPIC REMOVAL OF GASTRIC BAND Doing well postoperatively. No sign of ongoing gastric leakage. Okay for discharge.   LOS: 2 days    Zeddie Njie T 08/22/2017

## 2017-08-22 NOTE — Progress Notes (Signed)
Paged Dr Donne Hazel to notify of patients low-grade temp 100.8 and pulse 102. Patient is struggling with pain management. Encouraged IS use. Received verbal orders for tylenol sol 696m once.

## 2017-08-22 NOTE — Progress Notes (Signed)
Pt alert,ambulating, tolerating diet.  D/C instructions and prescription given, all questions answered. D/Cd home.

## 2017-08-23 ENCOUNTER — Other Ambulatory Visit: Payer: Self-pay | Admitting: *Deleted

## 2017-08-23 ENCOUNTER — Encounter: Payer: Self-pay | Admitting: *Deleted

## 2017-08-23 NOTE — Patient Outreach (Signed)
[facility]) Care Management  08/23/2017  Sharon Fowler 02/25/1971 [id number]   Subjective: Telephone call to patient's home / mobile number, spoke with patient, and HIPAA verified.  Discussed [facility] Care Management UMR Transition of care follow up, patient voiced understanding, and is in agreement to follow up.   Patient states she is doing fair, tolerating full liquid diet, has drain in place, able to self manage, has family to assist as needed, has follow up appointment with surgeon on 09/10/17, and will notify MD that she is still concerned with taking certain medications (blood pressure, cholesterol) due to stomach being vulnerable at this time.   States she was not taking these medications in the hospital and MD was aware.  RNCM educated patient on monitoring symptoms blood pressure, patient voiced understanding, and states she will follow up with MD. Patient voices understanding of medical diagnosis, surgery, and treatment plan.  States she is accessing the following [facility] benefits: outpatient pharmacy, hospital indemnity (verbally given contact number for [facility] [phone] to file claim, contact number for [facility] [employer] [phone] for itemized bill request),  family medical leave act (FMLA) paperwork submitted, and FMLA decision pending.  Patient states she does not have any education material, transition of care, care coordination, disease management, disease monitoring, transportation, community resource, or pharmacy needs at this time.   States she is very appreciative of the follow up and is in agreement to receive [facility] information.     Objective:  Per KPN (Knowledge Performance Now, point of care tool) and chart review, patient hospitalized 08/20/17 -08/22/17 for Erosion of Lap Band.    Status post LAPAROSCOPIC REMOVAL OF GASTRIC BAND on 08/20/17.   Had ED visit on 08/08/17 for acute cystitis.   Patient also has a history of  hypertension, goiter, iron deficiency anemia, and hyperlipidemia.      Assessment: Received UMR Transition of care referral on 08/21/17.   Transition of care follow up completed, no care management needs, and will proceed with case closure.     Plan: RNCM will send patient successful outreach letter, [facility] pamphlet, and magnet. RNCM will send case closure due to follow up completed / no care management needs request to [facility].    Delorice Bannister H. Annia Friendly, BSN, [facility] Telephonic CM Phone: [phone] Fax: [phone]

## 2017-08-31 DIAGNOSIS — Z01419 Encounter for gynecological examination (general) (routine) without abnormal findings: Secondary | ICD-10-CM | POA: Diagnosis not present

## 2017-08-31 DIAGNOSIS — R8271 Bacteriuria: Secondary | ICD-10-CM | POA: Diagnosis not present

## 2017-08-31 DIAGNOSIS — Z124 Encounter for screening for malignant neoplasm of cervix: Secondary | ICD-10-CM | POA: Diagnosis not present

## 2017-08-31 DIAGNOSIS — N760 Acute vaginitis: Secondary | ICD-10-CM | POA: Diagnosis not present

## 2017-08-31 DIAGNOSIS — Z1231 Encounter for screening mammogram for malignant neoplasm of breast: Secondary | ICD-10-CM | POA: Diagnosis not present

## 2017-08-31 MED FILL — ESTRADIOL 0.1 MG/GM CREA: 0.1 | 90 days supply | Qty: 43 | Fill #0

## 2017-08-31 MED FILL — KETOCONAZOLE 2% CREAM: 2 | 15 days supply | Qty: 60 | Fill #0

## 2017-09-10 ENCOUNTER — Encounter: Payer: Self-pay | Admitting: Family Medicine

## 2017-09-10 ENCOUNTER — Ambulatory Visit (INDEPENDENT_AMBULATORY_CARE_PROVIDER_SITE_OTHER): Payer: 59 | Admitting: Family Medicine

## 2017-09-10 ENCOUNTER — Ambulatory Visit: Payer: 59 | Admitting: Family Medicine

## 2017-09-10 VITALS — BP 110/80 | HR 69 | Temp 98.2°F | Wt 212.6 lb

## 2017-09-10 DIAGNOSIS — K9509 Other complications of gastric band procedure: Secondary | ICD-10-CM | POA: Diagnosis not present

## 2017-09-10 DIAGNOSIS — I10 Essential (primary) hypertension: Secondary | ICD-10-CM

## 2017-09-10 MED ORDER — LOSARTAN POTASSIUM 25 MG PO TABS: 25.0000 mg | ORAL_TABLET | Freq: Every day | ORAL | 5 refills | Status: DC

## 2017-09-10 MED FILL — LOSARTAN POTASSIUM 25 MG TA: 25 | 30 days supply | Qty: 30 | Fill #0

## 2017-09-10 NOTE — Patient Instructions (Addendum)
DASH Eating Plan DASH stands for "Dietary Approaches to Stop Hypertension." The DASH eating plan is a healthy eating plan that has been shown to reduce high blood pressure (hypertension). It may also reduce your risk for type 2 diabetes, heart disease, and stroke. The DASH eating plan may also help with weight loss. What are tips for following this plan? General guidelines  Avoid eating more than 2,300 mg (milligrams) of salt (sodium) a day. If you have hypertension, you may need to reduce your sodium intake to 1,500 mg a day.  Limit alcohol intake to no more than 1 drink a day for nonpregnant women and 2 drinks a day for men. One drink equals 12 oz of beer, 5 oz of wine, or 1 oz of hard liquor.  Work with your health care provider to maintain a healthy body weight or to lose weight. Ask what an ideal weight is for you.  Get at least 30 minutes of exercise that causes your heart to beat faster (aerobic exercise) most days of the week. Activities may include walking, swimming, or biking.  Work with your health care provider or diet and nutrition specialist (dietitian) to adjust your eating plan to your individual calorie needs. Reading food labels  Check food labels for the amount of sodium per serving. Choose foods with less than 5 percent of the Daily Value of sodium. Generally, foods with less than 300 mg of sodium per serving fit into this eating plan.  To find whole grains, look for the word "whole" as the first word in the ingredient list. Shopping  Buy products labeled as "low-sodium" or "no salt added."  Buy fresh foods. Avoid canned foods and premade or frozen meals. Cooking  Avoid adding salt when cooking. Use salt-free seasonings or herbs instead of table salt or sea salt. Check with your health care provider or pharmacist before using salt substitutes.  Do not fry foods. Cook foods using healthy methods such as baking, boiling, grilling, and broiling instead.  Cook with  heart-healthy oils, such as olive, canola, soybean, or sunflower oil. Meal planning   Eat a balanced diet that includes: ? 5 or more servings of fruits and vegetables each day. At each meal, try to fill half of your plate with fruits and vegetables. ? Up to 6-8 servings of whole grains each day. ? Less than 6 oz of lean meat, poultry, or fish each day. A 3-oz serving of meat is about the same size as a deck of cards. One egg equals 1 oz. ? 2 servings of low-fat dairy each day. ? A serving of nuts, seeds, or beans 5 times each week. ? Heart-healthy fats. Healthy fats called Omega-3 fatty acids are found in foods such as flaxseeds and coldwater fish, like sardines, salmon, and mackerel.  Limit how much you eat of the following: ? Canned or prepackaged foods. ? Food that is high in trans fat, such as fried foods. ? Food that is high in saturated fat, such as fatty meat. ? Sweets, desserts, sugary drinks, and other foods with added sugar. ? Full-fat dairy products.  Do not salt foods before eating.  Try to eat at least 2 vegetarian meals each week.  Eat more home-cooked food and less restaurant, buffet, and fast food.  When eating at a restaurant, ask that your food be prepared with less salt or no salt, if possible. What foods are recommended? The items listed may not be a complete list. Talk with your dietitian about what   dietary choices are best for you. Grains Whole-grain or whole-wheat bread. Whole-grain or whole-wheat pasta. Brown rice. Oatmeal. Quinoa. Bulgur. Whole-grain and low-sodium cereals. Pita bread. Low-fat, low-sodium crackers. Whole-wheat flour tortillas. Vegetables Fresh or frozen vegetables (raw, steamed, roasted, or grilled). Low-sodium or reduced-sodium tomato and vegetable juice. Low-sodium or reduced-sodium tomato sauce and tomato paste. Low-sodium or reduced-sodium canned vegetables. Fruits All fresh, dried, or frozen fruit. Canned fruit in natural juice (without  added sugar). Meat and other protein foods Skinless chicken or turkey. Ground chicken or turkey. Pork with fat trimmed off. Fish and seafood. Egg whites. Dried beans, peas, or lentils. Unsalted nuts, nut butters, and seeds. Unsalted canned beans. Lean cuts of beef with fat trimmed off. Low-sodium, lean deli meat. Dairy Low-fat (1%) or fat-free (skim) milk. Fat-free, low-fat, or reduced-fat cheeses. Nonfat, low-sodium ricotta or cottage cheese. Low-fat or nonfat yogurt. Low-fat, low-sodium cheese. Fats and oils Soft margarine without trans fats. Vegetable oil. Low-fat, reduced-fat, or light mayonnaise and salad dressings (reduced-sodium). Canola, safflower, olive, soybean, and sunflower oils. Avocado. Seasoning and other foods Herbs. Spices. Seasoning mixes without salt. Unsalted popcorn and pretzels. Fat-free sweets. What foods are not recommended? The items listed may not be a complete list. Talk with your dietitian about what dietary choices are best for you. Grains Baked goods made with fat, such as croissants, muffins, or some breads. Dry pasta or rice meal packs. Vegetables Creamed or fried vegetables. Vegetables in a cheese sauce. Regular canned vegetables (not low-sodium or reduced-sodium). Regular canned tomato sauce and paste (not low-sodium or reduced-sodium). Regular tomato and vegetable juice (not low-sodium or reduced-sodium). Pickles. Olives. Fruits Canned fruit in a light or heavy syrup. Fried fruit. Fruit in cream or butter sauce. Meat and other protein foods Fatty cuts of meat. Ribs. Fried meat. Bacon. Sausage. Bologna and other processed lunch meats. Salami. Fatback. Hotdogs. Bratwurst. Salted nuts and seeds. Canned beans with added salt. Canned or smoked fish. Whole eggs or egg yolks. Chicken or turkey with skin. Dairy Whole or 2% milk, cream, and half-and-half. Whole or full-fat cream cheese. Whole-fat or sweetened yogurt. Full-fat cheese. Nondairy creamers. Whipped toppings.  Processed cheese and cheese spreads. Fats and oils Butter. Stick margarine. Lard. Shortening. Ghee. Bacon fat. Tropical oils, such as coconut, palm kernel, or palm oil. Seasoning and other foods Salted popcorn and pretzels. Onion salt, garlic salt, seasoned salt, table salt, and sea salt. Worcestershire sauce. Tartar sauce. Barbecue sauce. Teriyaki sauce. Soy sauce, including reduced-sodium. Steak sauce. Canned and packaged gravies. Fish sauce. Oyster sauce. Cocktail sauce. Horseradish that you find on the shelf. Ketchup. Mustard. Meat flavorings and tenderizers. Bouillon cubes. Hot sauce and Tabasco sauce. Premade or packaged marinades. Premade or packaged taco seasonings. Relishes. Regular salad dressings. Where to find more information:  National Heart, Lung, and Blood Institute: www.nhlbi.nih.gov  American Heart Association: www.heart.org Summary  The DASH eating plan is a healthy eating plan that has been shown to reduce high blood pressure (hypertension). It may also reduce your risk for type 2 diabetes, heart disease, and stroke.  With the DASH eating plan, you should limit salt (sodium) intake to 2,300 mg a day. If you have hypertension, you may need to reduce your sodium intake to 1,500 mg a day.  When on the DASH eating plan, aim to eat more fresh fruits and vegetables, whole grains, lean proteins, low-fat dairy, and heart-healthy fats.  Work with your health care provider or diet and nutrition specialist (dietitian) to adjust your eating plan to your individual   calorie needs. This information is not intended to replace advice given to you by your health care provider. Make sure you discuss any questions you have with your health care provider. Document Released: 10/05/2011 Document Revised: 10/09/2016 Document Reviewed: 10/09/2016 Elsevier Interactive Patient Education  2017 [employer].  Managing Your Hypertension Hypertension is commonly called high blood pressure. This is when  the force of your blood pressing against the walls of your arteries is too strong. Arteries are blood vessels that carry blood from your heart throughout your body. Hypertension forces the heart to work harder to pump blood, and may cause the arteries to become narrow or stiff. Having untreated or uncontrolled hypertension can cause heart attack, stroke, kidney disease, and other problems. What are blood pressure readings? A blood pressure reading consists of a higher number over a lower number. Ideally, your blood pressure should be below 120/80. The first ("top") number is called the systolic pressure. It is a measure of the pressure in your arteries as your heart beats. The second ("bottom") number is called the diastolic pressure. It is a measure of the pressure in your arteries as the heart relaxes. What does my blood pressure reading mean? Blood pressure is classified into four stages. Based on your blood pressure reading, your health care provider may use the following stages to determine what type of treatment you need, if any. Systolic pressure and diastolic pressure are measured in a unit called mm Hg. Normal  Systolic pressure: below 096.  Diastolic pressure: below 80. Elevated  Systolic pressure: 283-662.  Diastolic pressure: below 80. Hypertension stage 1  Systolic pressure: 947-654.  Diastolic pressure: 65-03. Hypertension stage 2  Systolic pressure: 546 or above.  Diastolic pressure: 90 or above. What health risks are associated with hypertension? Managing your hypertension is an important responsibility. Uncontrolled hypertension can lead to:  A heart attack.  A stroke.  A weakened blood vessel (aneurysm).  Heart failure.  Kidney damage.  Eye damage.  Metabolic syndrome.  Memory and concentration problems.  What changes can I make to manage my hypertension? Hypertension can be managed by making lifestyle changes and possibly by taking medicines. Your  health care provider will help you make a plan to bring your blood pressure within a normal range. Eating and drinking  Eat a diet that is high in fiber and potassium, and low in salt (sodium), added sugar, and fat. An example eating plan is called the DASH (Dietary Approaches to Stop Hypertension) diet. To eat this way: ? Eat plenty of fresh fruits and vegetables. Try to fill half of your plate at each meal with fruits and vegetables. ? Eat whole grains, such as whole wheat pasta, brown rice, or whole grain bread. Fill about one quarter of your plate with whole grains. ? Eat low-fat diary products. ? Avoid fatty cuts of meat, processed or cured meats, and poultry with skin. Fill about one quarter of your plate with lean proteins such as fish, chicken without skin, beans, eggs, and tofu. ? Avoid premade and processed foods. These tend to be higher in sodium, added sugar, and fat.  Reduce your daily sodium intake. Most people with hypertension should eat less than 1,500 mg of sodium a day.  Limit alcohol intake to no more than 1 drink a day for nonpregnant women and 2 drinks a day for men. One drink equals 12 oz of beer, 5 oz of wine, or 1 oz of hard liquor. Lifestyle  Work with your health care provider  to maintain a healthy body weight, or to lose weight. Ask what an ideal weight is for you.  Get at least 30 minutes of exercise that causes your heart to beat faster (aerobic exercise) most days of the week. Activities may include walking, swimming, or biking.  Include exercise to strengthen your muscles (resistance exercise), such as weight lifting, as part of your weekly exercise routine. Try to do these types of exercises for 30 minutes at least 3 days a week.  Do not use any products that contain nicotine or tobacco, such as cigarettes and e-cigarettes. If you need help quitting, ask your health care provider.  Control any long-term (chronic) conditions you have, such as high cholesterol  or diabetes. Monitoring  Monitor your blood pressure at home as told by your health care provider. Your personal target blood pressure may vary depending on your medical conditions, your age, and other factors.  Have your blood pressure checked regularly, as often as told by your health care provider. Working with your health care provider  Review all the medicines you take with your health care provider because there may be side effects or interactions.  Talk with your health care provider about your diet, exercise habits, and other lifestyle factors that may be contributing to hypertension.  Visit your health care provider regularly. Your health care provider can help you create and adjust your plan for managing hypertension. Will I need medicine to control my blood pressure? Your health care provider may prescribe medicine if lifestyle changes are not enough to get your blood pressure under control, and if:  Your systolic blood pressure is 130 or higher.  Your diastolic blood pressure is 80 or higher.  Take medicines only as told by your health care provider. Follow the directions carefully. Blood pressure medicines must be taken as prescribed. The medicine does not work as well when you skip doses. Skipping doses also puts you at risk for problems. Contact a health care provider if:  You think you are having a reaction to medicines you have taken.  You have repeated (recurrent) headaches.  You feel dizzy.  You have swelling in your ankles.  You have trouble with your vision. Get help right away if:  You develop a severe headache or confusion.  You have unusual weakness or numbness, or you feel faint.  You have severe pain in your chest or abdomen.  You vomit repeatedly.  You have trouble breathing. Summary  Hypertension is when the force of blood pumping through your arteries is too strong. If this condition is not controlled, it may put you at risk for serious  complications.  Your personal target blood pressure may vary depending on your medical conditions, your age, and other factors. For most people, a normal blood pressure is less than 120/80.  Hypertension is managed by lifestyle changes, medicines, or both. Lifestyle changes include weight loss, eating a healthy, low-sodium diet, exercising more, and limiting alcohol. This information is not intended to replace advice given to you by your health care provider. Make sure you discuss any questions you have with your health care provider. Document Released: 07/10/2012 Document Revised: 09/13/2016 Document Reviewed: 09/13/2016 Elsevier Interactive Patient Education  [employer].

## 2017-09-10 NOTE — Progress Notes (Signed)
Subjective:    Patient ID: Sharon Fowler, female    DOB: 02/12/1971, 46 y.o.   MRN: [id number]  Chief Complaint  Patient presents with  . Follow-up    HPI Patient was seen today for f/u.  Shortly after last OFV pt was seen by UC for L sided abdominal pain.  She was sent to the ED for CT which was negative.  She was again seen by UC a few days later and treated for a UTI.  She was seen by her surgeon for continued abd pain despite being on an abx.  It was at this appointment the decision was made to proceed with UGD which showed errosion of her lap band.  Patient was admitted to [facility] from 10/22 to 08/22/17.  Since the procedure pt states she has been doing well.  The JP drain was removed 1 wk after d/c.  She has been trying to decrease portions and continue to eat healthy but has noticed she gained ~5 lbs.  Pt states after 6 months if she continues to gain wt she can have the gastric sleeve procedure if needed.    Since last office visit patient has continued to have a dry cough.  The cough was initially thought secondary to allergies.  Patient states it has improved some but has not gone away completely.  Patient is on lisinopril 10 mg daily for blood pressure.  It was previously discussed that this medication could cause cough.  Pt has a bp cuff at home, but has not been checking her bp.  Past Medical History:  Diagnosis Date  . ANEMIA, IRON DEFICIENCY UNSPEC 06/11/2009   corrected with iron replacement---menstrual reelated  . GOITER, MULTINODULAR 07/15/2009  . HYPERLIPIDEMIA 03/25/2008  . HYPERTENSION 03/25/2008  . OBESITY 03/25/2008    Allergies  Allergen Reactions  . Chlorthalidone Other (See Comments)    REACTION: hypokalemia (2.8)    ROS General: Denies fever, chills, night sweats, changes in weight, changes in appetite HEENT: Denies headaches, ear pain, changes in vision, rhinorrhea, sore throat CV: Denies CP, palpitations, SOB, orthopnea Pulm: Denies SOB, cough,  wheezing GI: Denies abdominal pain, nausea, vomiting, diarrhea, constipation GU: Denies dysuria, hematuria, frequency, vaginal discharge Msk: Denies muscle cramps, joint pains Neuro: Denies weakness, numbness, tingling Skin: Denies rashes, bruising Psych: Denies depression, anxiety, hallucinations     Objective:    Blood pressure 110/80, pulse 69, temperature 98.2 F (36.8 C), temperature source Oral, weight 212 lb 9.6 oz (96.4 kg), last menstrual period 05/08/2011.   Gen. Pleasant, well-nourished, in no distress, normal affect   HEENT: [place]/AT, face symmetric, no scleral icterus, PERRLA, nares patent without drainage, pharynx without erythema or exudate. Lungs: no accessory muscle use, CTAB, no wheezes or rales Cardiovascular: RRR, no m/r/g, no peripheral edema Abdomen: BS present, soft, NT/ND, no hepatosplenomegaly. Musculoskeletal: No deformities, no cyanosis or clubbing, normal tone Neuro:  A&Ox3, CN II-XII intact, normal gait Skin:  Warm, no lesions/ rash.  Surgical incisions on abdomen healing well.  Site of JP drain healing well, no strikethrough on bandage, no erythema, no induration, no drainage.   Wt Readings from Last 3 Encounters:  09/10/17 212 lb 9.6 oz (96.4 kg)  08/20/17 217 lb (98.4 kg)  08/17/17 213 lb (96.6 kg)    Lab Results  Component Value Date   WBC 9.1 08/22/2017   HGB 11.4 (L) 08/22/2017   HCT 34.9 (L) 08/22/2017   PLT 307 08/22/2017   GLUCOSE 84 08/21/2017   CHOL 194 08/01/2017  TRIG 85.0 08/01/2017   HDL 50.40 08/01/2017   LDLDIRECT 197.8 10/15/2013   LDLCALC 127 (H) 08/01/2017   ALT 28 08/21/2017   AST 36 08/21/2017   NA 138 08/21/2017   K 4.0 08/21/2017   CL 106 08/21/2017   CREATININE 0.51 08/21/2017   BUN 7 08/21/2017   CO2 22 08/21/2017   TSH 0.50 08/01/2017   HGBA1C 5.6 08/01/2017    Assessment/Plan:  Essential hypertension  -We will discontinue lisinopril 10 mg secondary to cough -If cough does not improve we will consider  restarting. -Patient encouraged to check blood pressure at home and keep a log. - Plan: losartan (COZAAR) 25 MG tablet -Given RTC precautions including headache, changes in vision, chest pain, etc.  Erosion of gastric band, s/p removal -Patient doing well status post surgery -Surgical incisions healing well -Patient encouraged to continue monitoring eating habits, exercising, decreasing intake of sodium. -Follow-up with surgeon as needed -Given RTC precautions including fever, nausea, vomiting, abdominal pain, chills.  Follow-up for BP check in 2-4 weeks.

## 2017-09-14 ENCOUNTER — Encounter: Payer: Self-pay | Admitting: Family Medicine

## 2017-10-01 ENCOUNTER — Ambulatory Visit (INDEPENDENT_AMBULATORY_CARE_PROVIDER_SITE_OTHER): Payer: 59 | Admitting: Family Medicine

## 2017-10-01 VITALS — BP 118/80 | HR 73 | Temp 98.4°F | Wt 218.2 lb

## 2017-10-01 DIAGNOSIS — R05 Cough: Secondary | ICD-10-CM

## 2017-10-01 DIAGNOSIS — R059 Cough, unspecified: Secondary | ICD-10-CM

## 2017-10-01 DIAGNOSIS — I10 Essential (primary) hypertension: Secondary | ICD-10-CM | POA: Diagnosis not present

## 2017-10-01 MED ORDER — LOSARTAN POTASSIUM 25 MG PO TABS: 25.0000 mg | ORAL_TABLET | Freq: Every day | ORAL | 3 refills | Status: DC

## 2017-10-01 NOTE — Progress Notes (Signed)
Subjective:    Patient ID: Sharon Fowler, female    DOB: 04/10/1971, 46 y.o.   MRN: [id number]  Chief Complaint  Patient presents with  . Follow-up    HPI Patient was seen today for f/u on blood pressure.  Patient states she has been feeling well.  She has not been checking BP at home.  Patient taking losartan 25 mg daily.  Patient states she is still having a dry cough.  She was formally on lisinopril 10 mg daily but this was DC'd secondary to the cough.  Patient was advised to start allergy medication, but has yet to do this.  Patient denies contact with mold, wheezing, tobacco use.  Patient does have a dog that sheds.  Patient also endorses recent secondhand smoke contact.  Patient also mentions the continued clicking in her chest.  She hears and feels a click when she lays down flat.  Patient noticed symptoms after donating plasma several months ago.  Patient denies chest pain, shortness of breath, palpitations, lower extremity edema.  Past Medical History:  Diagnosis Date  . ANEMIA, IRON DEFICIENCY UNSPEC 06/11/2009   corrected with iron replacement---menstrual reelated  . GOITER, MULTINODULAR 07/15/2009  . HYPERLIPIDEMIA 03/25/2008  . HYPERTENSION 03/25/2008  . OBESITY 03/25/2008    Allergies  Allergen Reactions  . Chlorthalidone Other (See Comments)    REACTION: hypokalemia (2.8)    ROS General: Denies fever, chills, night sweats, changes in weight, changes in appetite HEENT: Denies headaches, ear pain, changes in vision, rhinorrhea, sore throat CV: Denies CP, palpitations, SOB, orthopnea  +clicking in chest Pulm: Denies SOB, wheezing  +dry cough GI: Denies abdominal pain, nausea, vomiting, diarrhea, constipation GU: Denies dysuria, hematuria, frequency, vaginal discharge Msk: Denies muscle cramps, joint pains Neuro: Denies weakness, numbness, tingling Skin: Denies rashes, bruising Psych: Denies depression, anxiety, hallucinations     Objective:    Blood pressure  118/80, pulse 73, temperature 98.4 F (36.9 C), temperature source Oral, weight 218 lb 3.2 oz (99 kg), last menstrual period 05/08/2011.   Gen. Pleasant, well-nourished, in no distress, normal affect   HEENT: [place]/AT, face symmetric, no scleral icterus, PERRLA, nares patent without drainage, pharynx without erythema or exudate. Lungs: no accessory muscle use, CTAB, no wheezes or rales Cardiovascular: RRR, no m/r/g, no peripheral edema Musculoskeletal: No deformities, no cyanosis or clubbing, normal tone Neuro:  A&Ox3, CN II-XII intact, normal gait Skin:  Warm, no lesions/ rash   Wt Readings from Last 3 Encounters:  10/01/17 218 lb 3.2 oz (99 kg)  09/10/17 212 lb 9.6 oz (96.4 kg)  08/20/17 217 lb (98.4 kg)    Lab Results  Component Value Date   WBC 9.1 08/22/2017   HGB 11.4 (L) 08/22/2017   HCT 34.9 (L) 08/22/2017   PLT 307 08/22/2017   GLUCOSE 84 08/21/2017   CHOL 194 08/01/2017   TRIG 85.0 08/01/2017   HDL 50.40 08/01/2017   LDLDIRECT 197.8 10/15/2013   LDLCALC 127 (H) 08/01/2017   ALT 28 08/21/2017   AST 36 08/21/2017   NA 138 08/21/2017   K 4.0 08/21/2017   CL 106 08/21/2017   CREATININE 0.51 08/21/2017   BUN 7 08/21/2017   CO2 22 08/21/2017   TSH 0.50 08/01/2017   HGBA1C 5.6 08/01/2017    Assessment/Plan:  Essential hypertension  -controlled 118/80 -Continue losartan 25 mg daily.  Will send in new rx for 90 day supply -pt encouraged to check bp at home -continue lifestyle modifications. - Plan: losartan (COZAAR) 25 MG tablet -  RTC in 3 months.  Cough -dry cough, continues after d/c of lisinopril. -consider allergy causes. -Encouraged to try a daily allergy medication such as Zyrtec, Allergra, Claritin. -offered nasal spray, pt declined at this time.  Will try po med. -RTC in the next few wks if continues.  Will do some research on clicking in chest, exam reassuring.  Schedule CPE.

## 2017-10-01 NOTE — Patient Instructions (Addendum)
Try a daily allergy medicine such as, Zyrtec, Allegra, or Claritin for your cough.   If after a week you are still having the cough, please don't hesitate to inform the clinic so we can look into other causes of your cough.  If possible check your blood pressure every once and a while to see what it is.  If you notice it creeping back up or you are feeling bad (headache, chest pain, changes in vision, etc.) please let us know .     DASH Eating Plan DASH stands for "Dietary Approaches to Stop Hypertension." The DASH eating plan is a healthy eating plan that has been shown to reduce high blood pressure (hypertension). It may also reduce your risk for type 2 diabetes, heart disease, and stroke. The DASH eating plan may also help with weight loss. What are tips for following this plan? General guidelines  Avoid eating more than 2,300 mg (milligrams) of salt (sodium) a day. If you have hypertension, you may need to reduce your sodium intake to 1,500 mg a day.  Limit alcohol intake to no more than 1 drink a day for nonpregnant women and 2 drinks a day for men. One drink equals 12 oz of beer, 5 oz of wine, or 1 oz of hard liquor.  Work with your health care provider to maintain a healthy body weight or to lose weight. Ask what an ideal weight is for you.  Get at least 30 minutes of exercise that causes your heart to beat faster (aerobic exercise) most days of the week. Activities may include walking, swimming, or biking.  Work with your health care provider or diet and nutrition specialist (dietitian) to adjust your eating plan to your individual calorie needs. Reading food labels  Check food labels for the amount of sodium per serving. Choose foods with less than 5 percent of the Daily Value of sodium. Generally, foods with less than 300 mg of sodium per serving fit into this eating plan.  To find whole grains, look for the word "whole" as the first word in the ingredient list. Shopping  Buy  products labeled as "low-sodium" or "no salt added."  Buy fresh foods. Avoid canned foods and premade or frozen meals. Cooking  Avoid adding salt when cooking. Use salt-free seasonings or herbs instead of table salt or sea salt. Check with your health care provider or pharmacist before using salt substitutes.  Do not fry foods. Cook foods using healthy methods such as baking, boiling, grilling, and broiling instead.  Cook with heart-healthy oils, such as olive, canola, soybean, or sunflower oil. Meal planning   Eat a balanced diet that includes: ? 5 or more servings of fruits and vegetables each day. At each meal, try to fill half of your plate with fruits and vegetables. ? Up to 6-8 servings of whole grains each day. ? Less than 6 oz of lean meat, poultry, or fish each day. A 3-oz serving of meat is about the same size as a deck of cards. One egg equals 1 oz. ? 2 servings of low-fat dairy each day. ? A serving of nuts, seeds, or beans 5 times each week. ? Heart-healthy fats. Healthy fats called Omega-3 fatty acids are found in foods such as flaxseeds and coldwater fish, like sardines, salmon, and mackerel.  Limit how much you eat of the following: ? Canned or prepackaged foods. ? Food that is high in trans fat, such as fried foods. ? Food that is high in saturated  fat, such as fatty meat. ? Sweets, desserts, sugary drinks, and other foods with added sugar. ? Full-fat dairy products.  Do not salt foods before eating.  Try to eat at least 2 vegetarian meals each week.  Eat more home-cooked food and less restaurant, buffet, and fast food.  When eating at a restaurant, ask that your food be prepared with less salt or no salt, if possible. What foods are recommended? The items listed may not be a complete list. Talk with your dietitian about what dietary choices are best for you. Grains Whole-grain or whole-wheat bread. Whole-grain or whole-wheat pasta. Brown rice. [place].  Bulgur. Whole-grain and low-sodium cereals. Pita bread. Low-fat, low-sodium crackers. Whole-wheat flour tortillas. Vegetables Fresh or frozen vegetables (raw, steamed, roasted, or grilled). Low-sodium or reduced-sodium tomato and vegetable juice. Low-sodium or reduced-sodium tomato sauce and tomato paste. Low-sodium or reduced-sodium canned vegetables. Fruits All fresh, dried, or frozen fruit. Canned fruit in natural juice (without added sugar). Meat and other protein foods Skinless chicken or [place]. Ground chicken or [place]. Pork with fat trimmed off. Fish and seafood. Egg whites. Dried beans, peas, or lentils. Unsalted nuts, nut butters, and seeds. Unsalted canned beans. Lean cuts of beef with fat trimmed off. Low-sodium, lean deli meat. Dairy Low-fat (1%) or fat-free (skim) milk. Fat-free, low-fat, or reduced-fat cheeses. Nonfat, low-sodium ricotta or cottage cheese. Low-fat or nonfat yogurt. Low-fat, low-sodium cheese. Fats and oils Soft margarine without trans fats. Vegetable oil. Low-fat, reduced-fat, or light mayonnaise and salad dressings (reduced-sodium). Canola, safflower, olive, soybean, and sunflower oils. Avocado. Seasoning and other foods Herbs. Spices. Seasoning mixes without salt. Unsalted popcorn and pretzels. Fat-free sweets. What foods are not recommended? The items listed may not be a complete list. Talk with your dietitian about what dietary choices are best for you. Grains Baked goods made with fat, such as croissants, muffins, or some breads. Dry pasta or rice meal packs. Vegetables Creamed or fried vegetables. Vegetables in a cheese sauce. Regular canned vegetables (not low-sodium or reduced-sodium). Regular canned tomato sauce and paste (not low-sodium or reduced-sodium). Regular tomato and vegetable juice (not low-sodium or reduced-sodium). Angie Fava. Olives. Fruits Canned fruit in a light or heavy syrup. Fried fruit. Fruit in cream or butter sauce. Meat and other  protein foods Fatty cuts of meat. Ribs. Fried meat. Berniece Salines. Sausage. Bologna and other processed lunch meats. Salami. Fatback. Hotdogs. Bratwurst. Salted nuts and seeds. Canned beans with added salt. Canned or smoked fish. Whole eggs or egg yolks. Chicken or [place] with skin. Dairy Whole or 2% milk, cream, and half-and-half. Whole or full-fat cream cheese. Whole-fat or sweetened yogurt. Full-fat cheese. Nondairy creamers. Whipped toppings. Processed cheese and cheese spreads. Fats and oils Butter. Stick margarine. Lard. Shortening. Ghee. Bacon fat. Tropical oils, such as coconut, palm kernel, or palm oil. Seasoning and other foods Salted popcorn and pretzels. Onion salt, garlic salt, seasoned salt, table salt, and sea salt. Worcestershire sauce. Tartar sauce. Barbecue sauce. Teriyaki sauce. Soy sauce, including reduced-sodium. Steak sauce. Canned and packaged gravies. Fish sauce. Oyster sauce. Cocktail sauce. Horseradish that you find on the shelf. Ketchup. Mustard. Meat flavorings and tenderizers. Bouillon cubes. Hot sauce and Tabasco sauce. Premade or packaged marinades. Premade or packaged taco seasonings. Relishes. Regular salad dressings. Where to find more information:  National Heart, Lung, and [place]: https://wilson-eaton.com/  American Heart Association: www.heart.org Summary  The DASH eating plan is a healthy eating plan that has been shown to reduce high blood pressure (hypertension). It may also reduce  your risk for type 2 diabetes, heart disease, and stroke.  With the DASH eating plan, you should limit salt (sodium) intake to 2,300 mg a day. If you have hypertension, you may need to reduce your sodium intake to 1,500 mg a day.  When on the DASH eating plan, aim to eat more fresh fruits and vegetables, whole grains, lean proteins, low-fat dairy, and heart-healthy fats.  Work with your health care provider or diet and nutrition specialist (dietitian) to adjust your eating plan to your  individual calorie needs. This information is not intended to replace advice given to you by your health care provider. Make sure you discuss any questions you have with your health care provider. Document Released: 10/05/2011 Document Revised: 10/09/2016 Document Reviewed: 10/09/2016 Elsevier Interactive Patient Education  2017 Elsevier Inc.  Cough, Adult Coughing is a reflex that clears your throat and your airways. Coughing helps to heal and protect your lungs. It is normal to cough occasionally, but a cough that happens with other symptoms or lasts a long time may be a sign of a condition that needs treatment. A cough may last only 2-3 weeks (acute), or it may last longer than 8 weeks (chronic). What are the causes? Coughing is commonly caused by:  Breathing in substances that irritate your lungs.  A viral or bacterial respiratory infection.  Allergies.  Asthma.  Postnasal drip.  Smoking.  Acid backing up from the stomach into the esophagus (gastroesophageal reflux).  Certain medicines.  Chronic lung problems, including COPD (or rarely, lung cancer).  Other medical conditions such as heart failure.  Follow these instructions at home: Pay attention to any changes in your symptoms. Take these actions to help with your discomfort:  Take medicines only as told by your health care provider. ? If you were prescribed an antibiotic medicine, take it as told by your health care provider. Do not stop taking the antibiotic even if you start to feel better. ? Talk with your health care provider before you take a cough suppressant medicine.  Drink enough fluid to keep your urine clear or pale yellow.  If the air is dry, use a cold steam vaporizer or humidifier in your bedroom or your home to help loosen secretions.  Avoid anything that causes you to cough at work or at home.  If your cough is worse at night, try sleeping in a semi-upright position.  Avoid cigarette smoke. If you  smoke, quit smoking. If you need help quitting, ask your health care provider.  Avoid caffeine.  Avoid alcohol.  Rest as needed.  Contact a health care provider if:  You have new symptoms.  You cough up pus.  Your cough does not get better after 2-3 weeks, or your cough gets worse.  You cannot control your cough with suppressant medicines and you are losing sleep.  You develop pain that is getting worse or pain that is not controlled with pain medicines.  You have a fever.  You have unexplained weight loss.  You have night sweats. Get help right away if:  You cough up blood.  You have difficulty breathing.  Your heartbeat is very fast. This information is not intended to replace advice given to you by your health care provider. Make sure you discuss any questions you have with your health care provider. Document Released: 04/14/2011 Document Revised: 03/23/2016 Document Reviewed: 12/23/2014 Elsevier Interactive Patient Education  2017 [employer].

## 2017-10-08 ENCOUNTER — Ambulatory Visit: Payer: 59 | Admitting: Skilled Nursing Facility1

## 2017-10-12 ENCOUNTER — Encounter: Payer: Self-pay | Admitting: Skilled Nursing Facility1

## 2017-10-12 ENCOUNTER — Encounter: Payer: 59 | Attending: General Surgery | Admitting: Skilled Nursing Facility1

## 2017-10-12 DIAGNOSIS — Z6841 Body Mass Index (BMI) 40.0 and over, adult: Secondary | ICD-10-CM | POA: Diagnosis not present

## 2017-10-12 DIAGNOSIS — E669 Obesity, unspecified: Secondary | ICD-10-CM | POA: Insufficient documentation

## 2017-10-12 DIAGNOSIS — Z9884 Bariatric surgery status: Secondary | ICD-10-CM | POA: Diagnosis not present

## 2017-10-12 DIAGNOSIS — Z713 Dietary counseling and surveillance: Secondary | ICD-10-CM | POA: Insufficient documentation

## 2017-10-12 NOTE — Patient Instructions (Signed)
-  Aim to have 4 food events a day  -Do your walking

## 2017-10-12 NOTE — Progress Notes (Signed)
LAGB Surgery  Medical Nutrition Therapy:  Appt start time:  536   End time: 820 .  Primary concerns today:  Post-operative bariatric surgery nutrition management.   October pain went to urgent care, thought it was spleen CT scan had a UTI got antibiotics, irritation around lap band called Dr. Excell Seltzer, made an appt to see her, labored breathing, lapband found in her stomach. Has gotten a jacket and gloves for walking in cold weather.Pt states she is possibly wanting sleeve but needs to wait 6 months for her stomach to heal.  Pt was not keen on coming in until her sleeve surgery but states she will.  Pt declined weight.   Surgery date: 10/04/10 Start weight at [facility]: 295.6 lbs Weight today: 217.4 Weight loss: 14 pounds   BMI: 39.72  Weight goal: 170 lbs   TANITA  BODY COMP RESULTS  04/09/12  07/15/12 01/30/13 08/12/13 02/16/14 08/21/14 10/21/14 12/15/14 02/22/15 04/06/15 05/03/2017   Fat Mass (lbs) 94.5 94.0 102.5 93.0 87.5 99.5 108.5 105.5 109.0 106.0 119.2   Fat Free Mass (lbs) 109.5 103.5 108.0 105.5 106.5 107.0 114.0 114.5 108.0 105.5 112.2   Total Body Water (lbs) 80.0 76.0 79.0 77.0 78.0 78.5 83.5 84.0 78.0 77.0 82   24-hr recall:  Only eats 2 meals   B (7 AM): 1 pack of oatmeal with butter and peanutbutter with protein granola then asleep Snk (12-1 PM)  crackers L ((PM): chicken and vegetables D (8-9 PM):  Snk(11 PM):    Snk (3-4 AM):  Fluid intake: sparkling water/flavored water/sugar free cool aid 54 ounces Estimated total protein intake: 60 g  Medications: See List Supplementation:  Refuses to take vitamins and calcium b12 and multi when you remember    Using straws: No Drinking while eating:  Tries to wait 30 minutes to drink after meals Hair loss: NO Carbonated beverages: about 1 x moth N/V/D/C: constipated  Last Lap-Band fill: none  Recent physical activity:  3 days a week 3+ miles walking, 3 days a week zumba for 60 minutes  Progress Towards Goal(s):  In  progress.   Nutritional Diagnosis:   [place]-3.3 Overweight/obesity related to past poor dietary habits and physical inactivity as evidenced by patient w/ recent LAGB surgery following dietary guidelines for continued weight loss.  Intervention:  Nutrition education/reinforcement.  Goals:  -Aim to have 4 food events a day -Do your walking   Monitoring/Evaluation:  Dietary intake, exercise, lap band fills, and body weight.

## 2017-10-16 MED FILL — LOSARTAN POTASSIUM 25 MG TA: 25 | 30 days supply | Qty: 30 | Fill #1

## 2017-11-13 ENCOUNTER — Ambulatory Visit: Payer: 59 | Admitting: Skilled Nursing Facility1

## 2017-11-19 ENCOUNTER — Ambulatory Visit: Payer: 59 | Admitting: Skilled Nursing Facility1

## 2017-11-20 MED FILL — LOSARTAN POTASSIUM 25 MG TA: 25 | 90 days supply | Qty: 90 | Fill #2

## 2017-12-05 MED FILL — diazePAM 5 MG TABS: 5 | 4 days supply | Qty: 4 | Fill #0

## 2017-12-24 MED FILL — TRIAZOLAM 0.25 MG TABLET: 0.25 | 1 days supply | Qty: 2 | Fill #0

## 2018-01-16 ENCOUNTER — Ambulatory Visit (INDEPENDENT_AMBULATORY_CARE_PROVIDER_SITE_OTHER): Payer: No Typology Code available for payment source | Admitting: Family Medicine

## 2018-01-16 ENCOUNTER — Encounter: Payer: Self-pay | Admitting: Family Medicine

## 2018-01-16 VITALS — BP 140/82 | HR 74 | Temp 98.1°F | Ht 63.0 in | Wt 254.0 lb

## 2018-01-16 DIAGNOSIS — I10 Essential (primary) hypertension: Secondary | ICD-10-CM

## 2018-01-16 DIAGNOSIS — L74519 Primary focal hyperhidrosis, unspecified: Secondary | ICD-10-CM

## 2018-01-16 NOTE — Progress Notes (Signed)
Subjective:    Patient ID: Sharon Fowler, female    DOB: [birth year], 47 y.o.   MRN: [id number]  No chief complaint on file.   HPI Patient was seen today for ongoing concern.  Patient endorses feeling like she has a vaginal odor x 1 year.  Patient denies increased vaginal discharge or urinary leakage.  Pt states she was seen by her OB/GYN and told that she did not have any infection.  Advised to wear for short, take Sherrod cohosh, and use estradiol vaginal cream monthly.  Patient endorses feeling increased sweating in her groin.  She was advised to wear boy shorts but does not like these is uncomfortable.  Pt wearing cotton briefs.  Patient wearing a panty liner daily.  Patient endorses forgetting to take her blood pressure medication most days.  Patient states in she is rushing the evening to leave the house for work.  In the morning when she gets off she is trying to hurry up and get in bed.  Patient states she has not been walking for the last few months 2/2 the weather and tired.  Patient also endorses not eating as well as she should be.  Past Medical History:  Diagnosis Date  . ANEMIA, IRON DEFICIENCY UNSPEC 06/11/2009   corrected with iron replacement---menstrual reelated  . GOITER, MULTINODULAR 07/15/2009  . HYPERLIPIDEMIA 03/25/2008  . HYPERTENSION 03/25/2008  . OBESITY 03/25/2008    Allergies  Allergen Reactions  . Chlorthalidone Other (See Comments)    REACTION: hypokalemia (2.8)    ROS General: Denies fever, chills, night sweats, changes in weight, changes in appetite HEENT: Denies headaches, ear pain, changes in vision, rhinorrhea, sore throat CV: Denies CP, palpitations, SOB, orthopnea Pulm: Denies SOB, cough, wheezing GI: Denies abdominal pain, nausea, vomiting, diarrhea, constipation GU: Denies dysuria, hematuria, frequency, vaginal discharge  +vaginal sweating/odor. Msk: Denies muscle cramps, joint pains Neuro: Denies weakness, numbness, tingling Skin: Denies rashes,  bruising Psych: Denies depression, anxiety, hallucinations     Objective:    Blood pressure 140/82, pulse 74, temperature 98.1 F (36.7 C), temperature source Oral, height 5' 3"  (1.6 m), weight 254 lb (115.2 kg), last menstrual period 05/08/2011, SpO2 90 %.   Gen. Pleasant, well-nourished, in no distress, normal affect   HEENT: [place]/AT, face symmetric, no scleral icterus, PERRLA, nares patent without drainage Lungs: no accessory muscle use Cardiovascular: RRR, no peripheral edema Neuro:  A&Ox3, CN II-XII intact, normal gait Skin:  Warm, no lesions/ rash   Wt Readings from Last 3 Encounters:  01/16/18 254 lb (115.2 kg)  10/01/17 218 lb 3.2 oz (99 kg)  09/10/17 212 lb 9.6 oz (96.4 kg)    Lab Results  Component Value Date   WBC 9.1 08/22/2017   HGB 11.4 (L) 08/22/2017   HCT 34.9 (L) 08/22/2017   PLT 307 08/22/2017   GLUCOSE 84 08/21/2017   CHOL 194 08/01/2017   TRIG 85.0 08/01/2017   HDL 50.40 08/01/2017   LDLDIRECT 197.8 10/15/2013   LDLCALC 127 (H) 08/01/2017   ALT 28 08/21/2017   AST 36 08/21/2017   NA 138 08/21/2017   K 4.0 08/21/2017   CL 106 08/21/2017   CREATININE 0.51 08/21/2017   BUN 7 08/21/2017   CO2 22 08/21/2017   TSH 0.50 08/01/2017   HGBA1C 5.6 08/01/2017    Assessment/Plan:  Excessive sweating, local -Patient reassured -Discussed ways to reduce moisture in the morning including non-talc powder, cotton panties, wearing breathable clothing that is not tight, weight loss. -Also discussed changing  panty liner throughout the day -Patient advised to avoid douching and other feminine hygiene products related to vaginal issues -Discussed Botox may be an option for excessive sweating, however patient declines this option. -Patient advised to revisit this issue with her OB/GYN if it continues.  Hypertension -Elevated in clinic -Patient encouraged to purchase pillbox to keep beside her bed so that she will remember to take her medications daily -Continue  losartan 25 mg daily -Patient encouraged to restart walking daily and lifestyle modifications  Follow-up PRN  Grier Mitts, MD

## 2018-02-14 ENCOUNTER — Encounter: Payer: Self-pay | Admitting: Family Medicine

## 2018-02-14 ENCOUNTER — Ambulatory Visit (INDEPENDENT_AMBULATORY_CARE_PROVIDER_SITE_OTHER): Payer: No Typology Code available for payment source | Admitting: Family Medicine

## 2018-02-14 VITALS — BP 126/80 | HR 98 | Temp 97.9°F | Ht 63.0 in | Wt 262.6 lb

## 2018-02-14 DIAGNOSIS — E049 Nontoxic goiter, unspecified: Secondary | ICD-10-CM

## 2018-02-14 DIAGNOSIS — Z13220 Encounter for screening for lipoid disorders: Secondary | ICD-10-CM | POA: Diagnosis not present

## 2018-02-14 DIAGNOSIS — Z0000 Encounter for general adult medical examination without abnormal findings: Secondary | ICD-10-CM

## 2018-02-14 DIAGNOSIS — Z131 Encounter for screening for diabetes mellitus: Secondary | ICD-10-CM

## 2018-02-14 LAB — BASIC METABOLIC PANEL
BUN: 18 mg/dL (ref 6–23)
CALCIUM: 9 mg/dL (ref 8.4–10.5)
CHLORIDE: 103 meq/L (ref 96–112)
CO2: 27 meq/L (ref 19–32)
Creatinine, Ser: 0.55 mg/dL (ref 0.40–1.20)
GFR: 153.48 mL/min (ref >60.00)
GLUCOSE: 91 mg/dL (ref 70–99)
Potassium: 3.7 mEq/L (ref 3.5–5.1)
Sodium: 138 mEq/L (ref 135–145)

## 2018-02-14 LAB — LIPID PANEL
Cholesterol: 244 mg/dL — ABNORMAL HIGH (ref 0–200)
HDL: 60.7 mg/dL (ref >39.00)
LDL Cholesterol: 168 mg/dL — ABNORMAL HIGH (ref 0–99)
NonHDL: 183.53
TRIGLYCERIDES: 77 mg/dL (ref 0.0–149.0)
Total CHOL/HDL Ratio: 4
VLDL: 15.4 mg/dL (ref 0.0–40.0)

## 2018-02-14 LAB — T4, FREE: FREE T4: 0.92 ng/dL (ref 0.60–1.60)

## 2018-02-14 LAB — CBC WITH DIFFERENTIAL/PLATELET
BASOS ABS: 0.1 10*3/uL (ref 0.0–0.1)
Basophils Relative: 0.6 % (ref 0.0–3.0)
EOS PCT: 1.7 % (ref 0.0–5.0)
Eosinophils Absolute: 0.2 10*3/uL (ref 0.0–0.7)
HCT: 41.2 % (ref 36.0–46.0)
HEMOGLOBIN: 13.4 g/dL (ref 12.0–15.0)
LYMPHS ABS: 2.5 10*3/uL (ref 0.7–4.0)
Lymphocytes Relative: 27.7 % (ref 12.0–46.0)
MCHC: 32.6 g/dL (ref 30.0–36.0)
MCV: 81.3 fl (ref 78.0–100.0)
MONOS PCT: 6.4 % (ref 3.0–12.0)
Monocytes Absolute: 0.6 10*3/uL (ref 0.1–1.0)
NEUTROS PCT: 63.6 % (ref 43.0–77.0)
Neutro Abs: 5.6 10*3/uL (ref 1.4–7.7)
Platelets: 316 10*3/uL (ref 150.0–400.0)
RBC: 5.07 Mil/uL (ref 3.87–5.11)
RDW: 15.6 % — ABNORMAL HIGH (ref 11.5–15.5)
WBC: 8.9 10*3/uL (ref 4.0–10.5)

## 2018-02-14 LAB — TSH: TSH: 2.28 u[IU]/mL (ref 0.35–4.50)

## 2018-02-14 LAB — HEMOGLOBIN A1C: Hgb A1c MFr Bld: 5.7 % (ref 4.6–6.5)

## 2018-02-14 NOTE — Patient Instructions (Signed)
Preventive Care 40-64 Years, Female Preventive care refers to lifestyle choices and visits with your health care provider that can promote health and wellness. What does preventive care include?  A yearly physical exam. This is also called an annual well check.  Dental exams once or twice a year.  Routine eye exams. Ask your health care provider how often you should have your eyes checked.  Personal lifestyle choices, including: ? Daily care of your teeth and gums. ? Regular physical activity. ? Eating a healthy diet. ? Avoiding tobacco and drug use. ? Limiting alcohol use. ? Practicing safe sex. ? Taking low-dose aspirin daily starting at age 58. ? Taking vitamin and mineral supplements as recommended by your health care provider. What happens during an annual well check? The services and screenings done by your health care provider during your annual well check will depend on your age, overall health, lifestyle risk factors, and family history of disease. Counseling Your health care provider may ask you questions about your:  Alcohol use.  Tobacco use.  Drug use.  Emotional well-being.  Home and relationship well-being.  Sexual activity.  Eating habits.  Work and work Statistician.  Method of birth control.  Menstrual cycle.  Pregnancy history.  Screening You may have the following tests or measurements:  Height, weight, and BMI.  Blood pressure.  Lipid and cholesterol levels. These may be checked every 5 years, or more frequently if you are over 81 years old.  Skin check.  Lung cancer screening. You may have this screening every year starting at age 78 if you have a 30-pack-year history of smoking and currently smoke or have quit within the past 15 years.  Fecal occult blood test (FOBT) of the stool. You may have this test every year starting at age 65.  Flexible sigmoidoscopy or colonoscopy. You may have a sigmoidoscopy every 5 years or a colonoscopy  every 10 years starting at age 30.  Hepatitis C blood test.  Hepatitis B blood test.  Sexually transmitted disease (STD) testing.  Diabetes screening. This is done by checking your blood sugar (glucose) after you have not eaten for a while (fasting). You may have this done every 1-3 years.  Mammogram. This may be done every 1-2 years. Talk to your health care provider about when you should start having regular mammograms. This may depend on whether you have a family history of breast cancer.  BRCA-related cancer screening. This may be done if you have a family history of breast, ovarian, tubal, or peritoneal cancers.  Pelvic exam and Pap test. This may be done every 3 years starting at age 80. Starting at age 36, this may be done every 5 years if you have a Pap test in combination with an HPV test.  Bone density scan. This is done to screen for osteoporosis. You may have this scan if you are at high risk for osteoporosis.  Discuss your test results, treatment options, and if necessary, the need for more tests with your health care provider. Vaccines Your health care provider may recommend certain vaccines, such as:  Influenza vaccine. This is recommended every year.  Tetanus, diphtheria, and acellular pertussis (Tdap, Td) vaccine. You may need a Td booster every 10 years.  Varicella vaccine. You may need this if you have not been vaccinated.  Zoster vaccine. You may need this after age 5.  Measles, mumps, and rubella (MMR) vaccine. You may need at least one dose of MMR if you were born in  1957 or later. You may also need a second dose.  Pneumococcal 13-valent conjugate (PCV13) vaccine. You may need this if you have certain conditions and were not previously vaccinated.  Pneumococcal polysaccharide (PPSV23) vaccine. You may need one or two doses if you smoke cigarettes or if you have certain conditions.  Meningococcal vaccine. You may need this if you have certain  conditions.  Hepatitis A vaccine. You may need this if you have certain conditions or if you travel or work in places where you may be exposed to hepatitis A.  Hepatitis B vaccine. You may need this if you have certain conditions or if you travel or work in places where you may be exposed to hepatitis B.  Haemophilus influenzae type b (Hib) vaccine. You may need this if you have certain conditions.  Talk to your health care provider about which screenings and vaccines you need and how often you need them. This information is not intended to replace advice given to you by your health care provider. Make sure you discuss any questions you have with your health care provider. Document Released: 11/12/2015 Document Revised: 07/05/2016 Document Reviewed: 08/17/2015 Elsevier Interactive Patient Education  2018 Else[employer] Inc.  Exercising to Lose Weight Exercising can help you to lose weight. In order to lose weight through exercise, you need to do vigorous-intensity exercise. You can tell that you are exercising with vigorous intensity if you are breathing very hard and fast and cannot hold a conversation while exercising. Moderate-intensity exercise helps to maintain your current weight. You can tell that you are exercising at a moderate level if you have a higher heart rate and faster breathing, but you are still able to hold a conversation. How often should I exercise? Choose an activity that you enjoy and set realistic goals. Your health care provider can help you to make an activity plan that works for you. Exercise regularly as directed by your health care provider. This may include:  Doing resistance training twice each week, such as: ? Push-ups. ? Sit-ups. ? Lifting weights. ? Using resistance bands.  Doing a given intensity of exercise for a given amount of time. Choose from these options: ? 150 minutes of moderate-intensity exercise every week. ? 75 minutes of vigorous-intensity  exercise every week. ? A mix of moderate-intensity and vigorous-intensity exercise every week.  Children, pregnant women, people who are out of shape, people who are overweight, and older adults may need to consult a health care provider for individual recommendations. If you have any sort of medical condition, be sure to consult your health care provider before starting a new exercise program. What are some activities that can help me to lose weight?  Walking at a rate of at least 4.5 miles an hour.  Jogging or running at a rate of 5 miles per hour.  Biking at a rate of at least 10 miles per hour.  Lap swimming.  Roller-skating or in-line skating.  Cross-country skiing.  Vigorous competitive sports, such as football, basketball, and soccer.  Jumping rope.  Aerobic dancing. How can I be more active in my day-to-day activities?  Use the stairs instead of the elevator.  Take a walk during your lunch break.  If you drive, park your car farther away from work or school.  If you take public transportation, get off one stop early and walk the rest of the way.  Make all of your phone calls while standing up and walking around.  Get up, stretch, and walk   around every 30 minutes throughout the day. What guidelines should I follow while exercising?  Do not exercise so much that you hurt yourself, feel dizzy, or get very short of breath.  Consult your health care provider prior to starting a new exercise program.  Wear comfortable clothes and shoes with good support.  Drink plenty of water while you exercise to prevent dehydration or heat stroke. Body water is lost during exercise and must be replaced.  Work out until you breathe faster and your heart beats faster. This information is not intended to replace advice given to you by your health care provider. Make sure you discuss any questions you have with your health care provider. Document Released: 11/18/2010 Document Revised:  03/23/2016 Document Reviewed: 03/19/2014 Elsevier Interactive Patient Education  [employer].

## 2018-02-14 NOTE — Progress Notes (Signed)
Subjective:     Sharon Fowler is a 47 y.o. female and is here for a comprehensive physical exam. The patient reports no problems.  Pt has been working out.  But has noticed she is still gaining weight.  Patient endorses being a stress eater.  Current stress includes teaching her daughters how to drive and work related stress.  Patient states she is considering having another gastric procedure.  In the past patient had lap band but it was removed 2/2 erosion.  Social History   Socioeconomic History  . Marital status: Legally Separated    Spouse name: Not on file  . Number of children: Not on file  . Years of education: Not on file  . Highest education level: Not on file  Occupational History  . Not on file  Social Needs  . Financial resource strain: Not on file  . Food insecurity:    Worry: Not on file    Inability: Not on file  . Transportation needs:    Medical: Not on file    Non-medical: Not on file  Tobacco Use  . Smoking status: Former Smoker    Last attempt to quit: 01/02/1991    Years since quitting: 27.1  . Smokeless tobacco: Never Used  Substance and Sexual Activity  . Alcohol use: Yes    Alcohol/week: 0.0 oz    Comment: occ  . Drug use: No  . Sexual activity: Not Currently  Lifestyle  . Physical activity:    Days per week: Not on file    Minutes per session: Not on file  . Stress: Not on file  Relationships  . Social connections:    Talks on phone: Not on file    Gets together: Not on file    Attends religious service: Not on file    Active member of club or organization: Not on file    Attends meetings of clubs or organizations: Not on file    Relationship status: Not on file  . Intimate partner violence:    Fear of current or ex partner: Not on file    Emotionally abused: Not on file    Physically abused: Not on file    Forced sexual activity: Not on file  Other Topics Concern  . 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    She likes to sleep when she is not at work.    Health Maintenance  Topic Date Due  . HIV Screening  10/11/1987  . INFLUENZA VACCINE  05/30/2018  . MAMMOGRAM  08/31/2018  . PAP SMEAR  08/31/2018  . TETANUS/TDAP  02/07/2024    The following portions of the patient's history were reviewed and updated as appropriate: allergies, current medications, past family history, past medical history, past social history, past surgical history and problem list.  Review of Systems A comprehensive review of systems was negative.   Objective:    BP 126/80 (BP Location: Left Arm, Patient Position: Sitting, Cuff Size: Normal)   Pulse 98   Temp 97.9 F (36.6 C) (Oral)   Ht 5' 3"  (1.6 m)   Wt 262 lb 9.6 oz (119.1 kg)   LMP 05/08/2011 (Approximate)   SpO2 99%   BMI 46.52 kg/m  General appearance: alert, cooperative, appears stated age and no distress Head: Normocephalic, without obvious abnormality, atraumatic Eyes: conjunctivae/corneas clear. PERRL, EOM's  intact. Fundi benign. Ears: normal TM's and external ear canals both ears Nose: Nares normal. Septum midline. Mucosa normal. No drainage or sinus tenderness. Throat: lips, mucosa, and tongue normal; teeth and gums normal Neck: no adenopathy, no carotid bruit, no JVD, supple, symmetrical, trachea midline and thyroid not enlarged, symmetric, no tenderness/mass/nodules Lungs: clear to auscultation bilaterally Heart: regular rate and rhythm, S1, S2 normal, no murmur, click, rub or gallop Abdomen: soft, non-tender; bowel sounds normal; no masses,  no organomegaly Extremities: extremities normal, atraumatic, no cyanosis or edema Skin: Skin color, texture, turgor normal. No rashes or lesions Neurologic: Alert and oriented X 3, normal strength and tone. Normal symmetric reflexes. Normal coordination and gait    Assessment:    Healthy female exam.      Plan:       Anticipatory guidance given including wearing seatbelts, increasing physical activity, smoke detectors in the home, increasing p.o. intake of water and vegetables. -Discussed seeing nutrition.  Patient is amenable to this idea -Pap and mammogram up-to-date patient followed by OB/GYN. -Next CPE in 1 year -We will obtain labs this visit See After Visit Summary for Counseling Recommendations   Goiter -Stable, asymptomatic -We will obtain TSH and T4  Follow-up PRN  Grier Mitts, MD

## 2018-04-18 MED FILL — ATORVASTATIN 10 MG TABLET: 10 | 90 days supply | Qty: 90 | Fill #1

## 2018-04-18 MED FILL — LOSARTAN POTASSIUM 25 MG TA: 25 | 30 days supply | Qty: 30 | Fill #3

## 2018-06-12 ENCOUNTER — Ambulatory Visit (INDEPENDENT_AMBULATORY_CARE_PROVIDER_SITE_OTHER): Payer: No Typology Code available for payment source | Admitting: Family Medicine

## 2018-06-12 ENCOUNTER — Encounter: Payer: Self-pay | Admitting: Family Medicine

## 2018-06-12 VITALS — BP 142/82 | HR 100 | Temp 98.3°F | Wt 284.0 lb

## 2018-06-12 DIAGNOSIS — N898 Other specified noninflammatory disorders of vagina: Secondary | ICD-10-CM | POA: Diagnosis not present

## 2018-06-12 LAB — POC URINALSYSI DIPSTICK (AUTOMATED)
Bilirubin, UA: NEGATIVE
Glucose, UA: NEGATIVE
Ketones, UA: NEGATIVE
Leukocytes, UA: NEGATIVE
Nitrite, UA: NEGATIVE
PH UA: 6 (ref 5.0–8.0)
PROTEIN UA: POSITIVE — AB
UROBILINOGEN UA: 1 U/dL

## 2018-06-12 IMAGING — DX DG THORACIC SPINE 2V
2 series · 2 of 2 positions shown · non-contrast
Comparison: Radiographs June 28, 2010.

CLINICAL DATA: Acute left-sided thoracic back pain.

EXAM:
THORACIC SPINE 2 VIEWS

[t-spine ap]
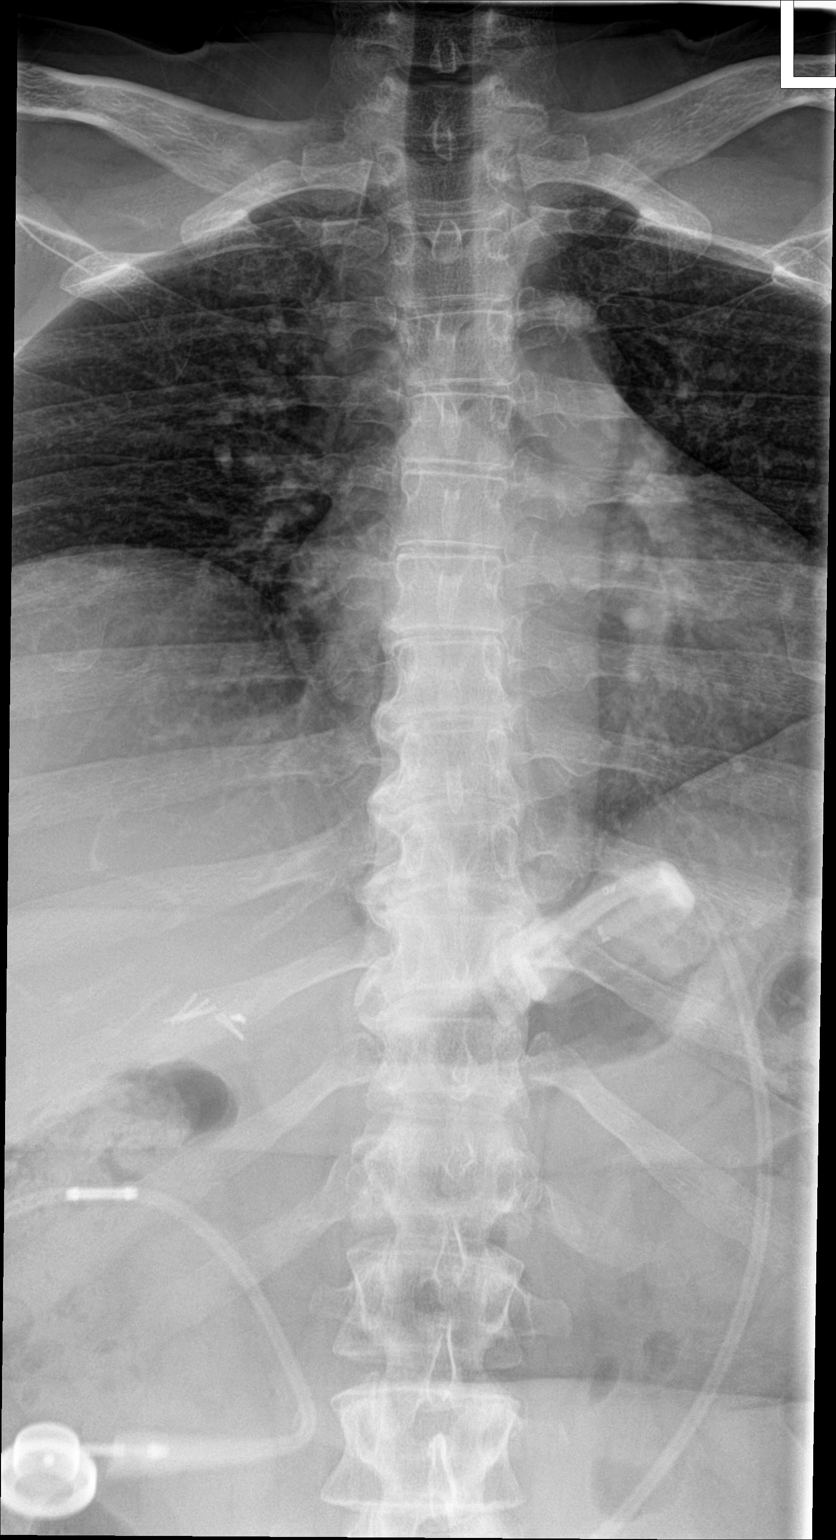

[t-spine lat]
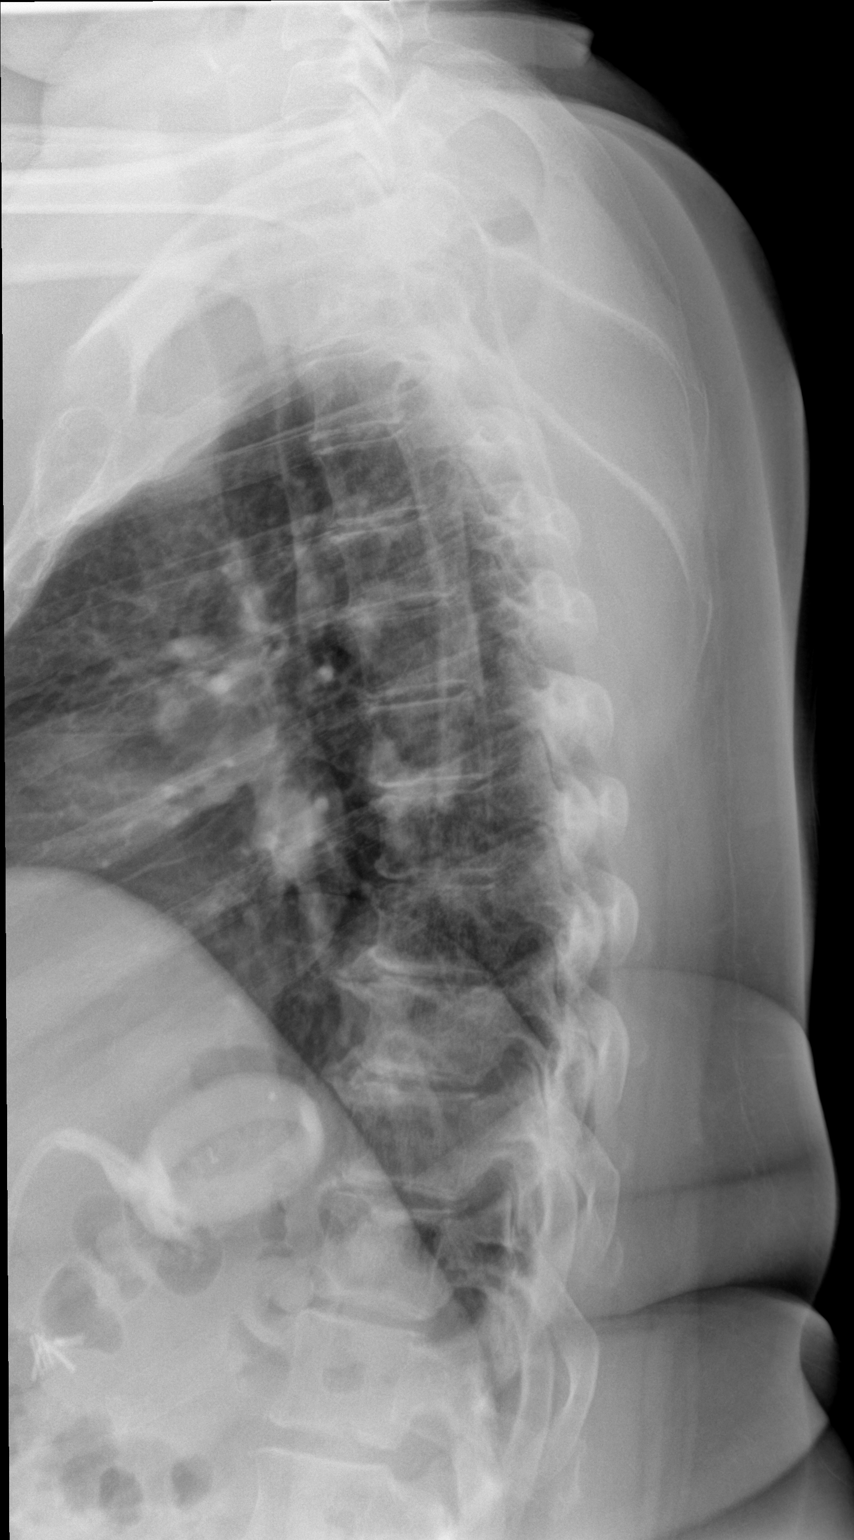

[2 of 2 positions shown; findings below may reference images not displayed]

FINDINGS: There is no evidence of thoracic spine fracture or
spondylolisthesis. Mild anterior osteophyte formation is noted at
multiple levels of the lower thoracic spine.
IMPRESSION: Mild degenerative changes are noted in the lower thoracic spine. No
acute abnormality is noted.

## 2018-06-12 IMAGING — DX DG ABDOMEN 2V
3 series · 3 of 3 positions shown · non-contrast
Comparison: Radiographs May 12, 2016.

CLINICAL DATA: Vomiting, nausea.

EXAM:
ABDOMEN - 2 VIEW

[abdomen erect (1 of 2)]
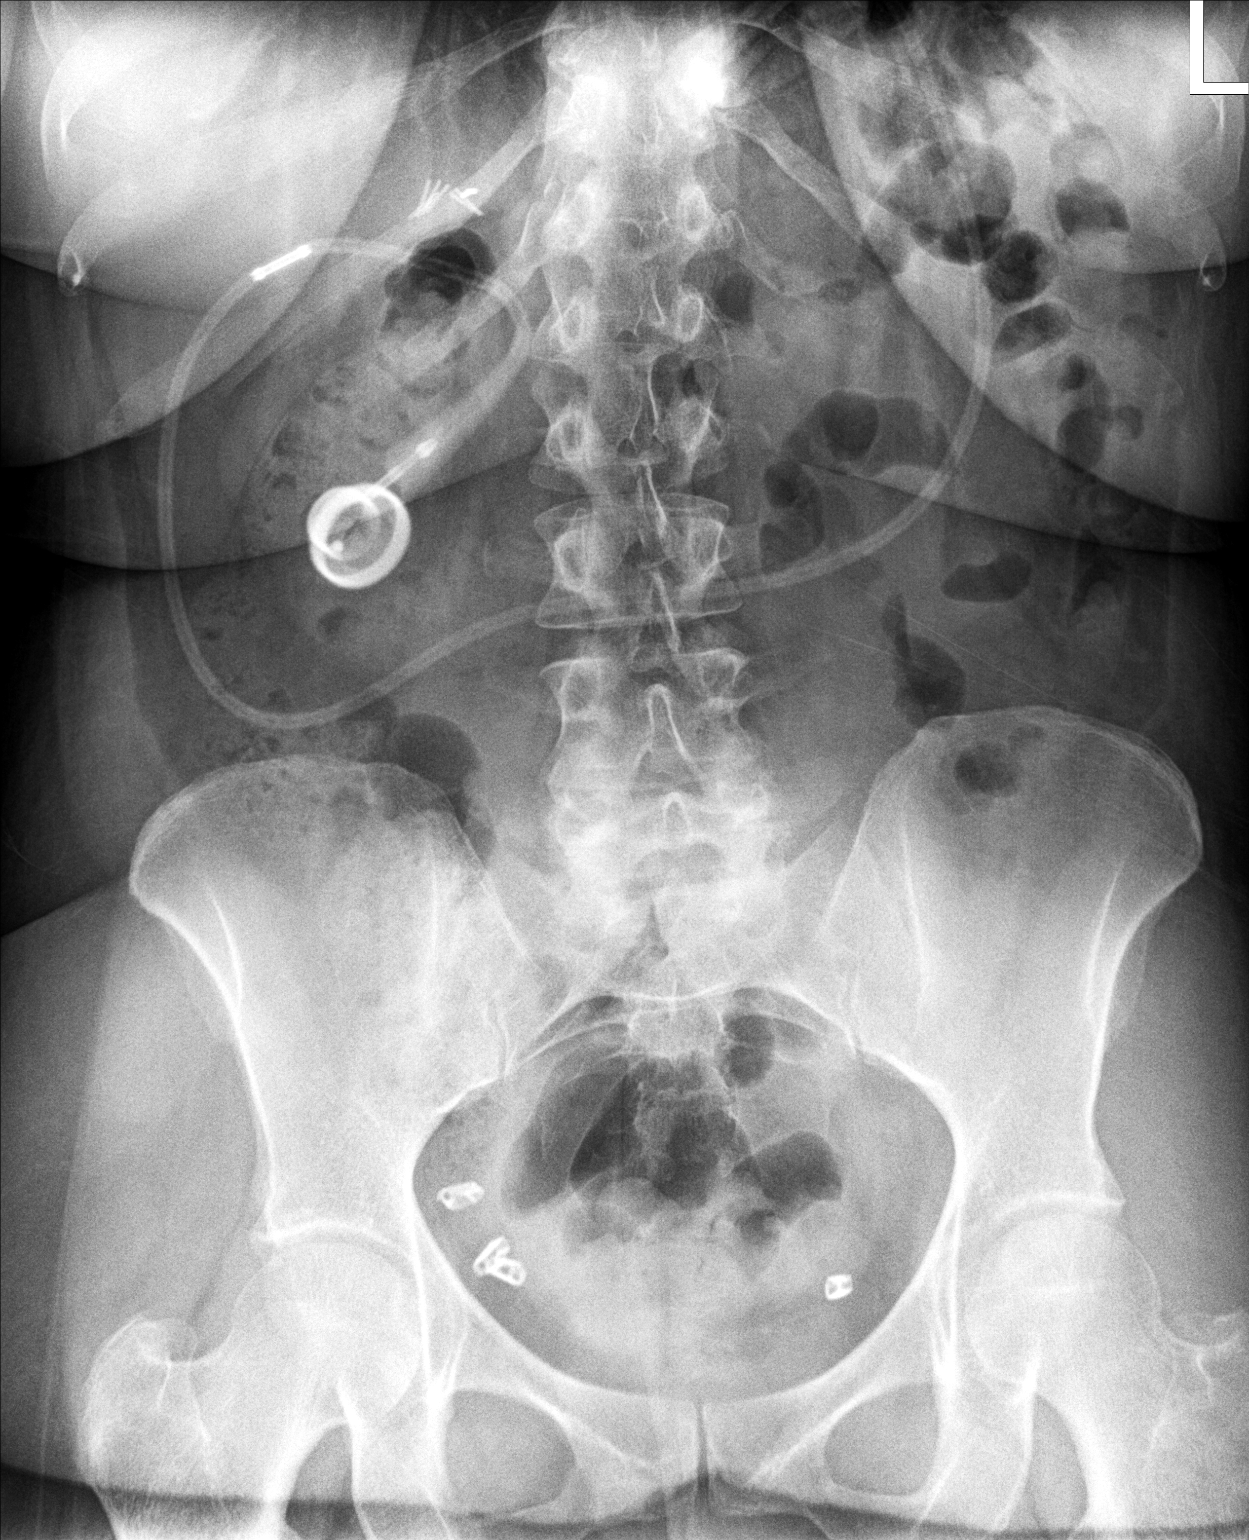

[abdomen supine]
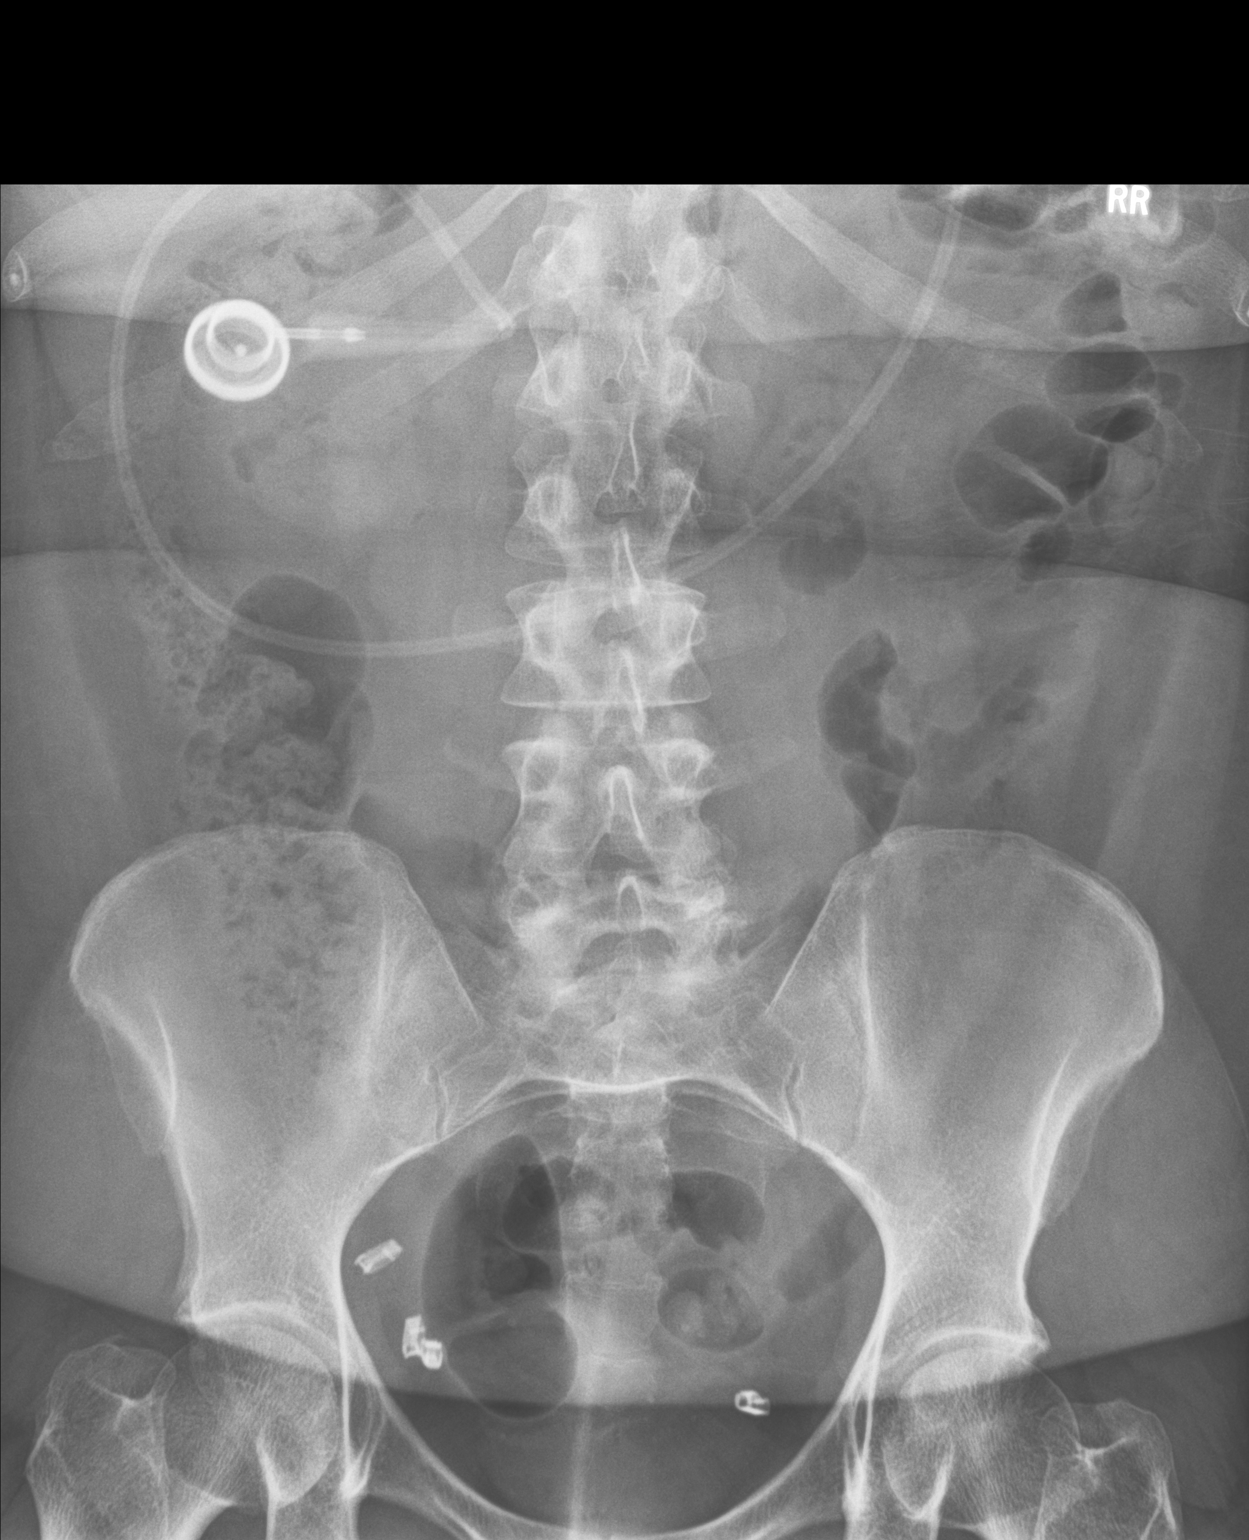

[abdomen erect (2 of 2)]
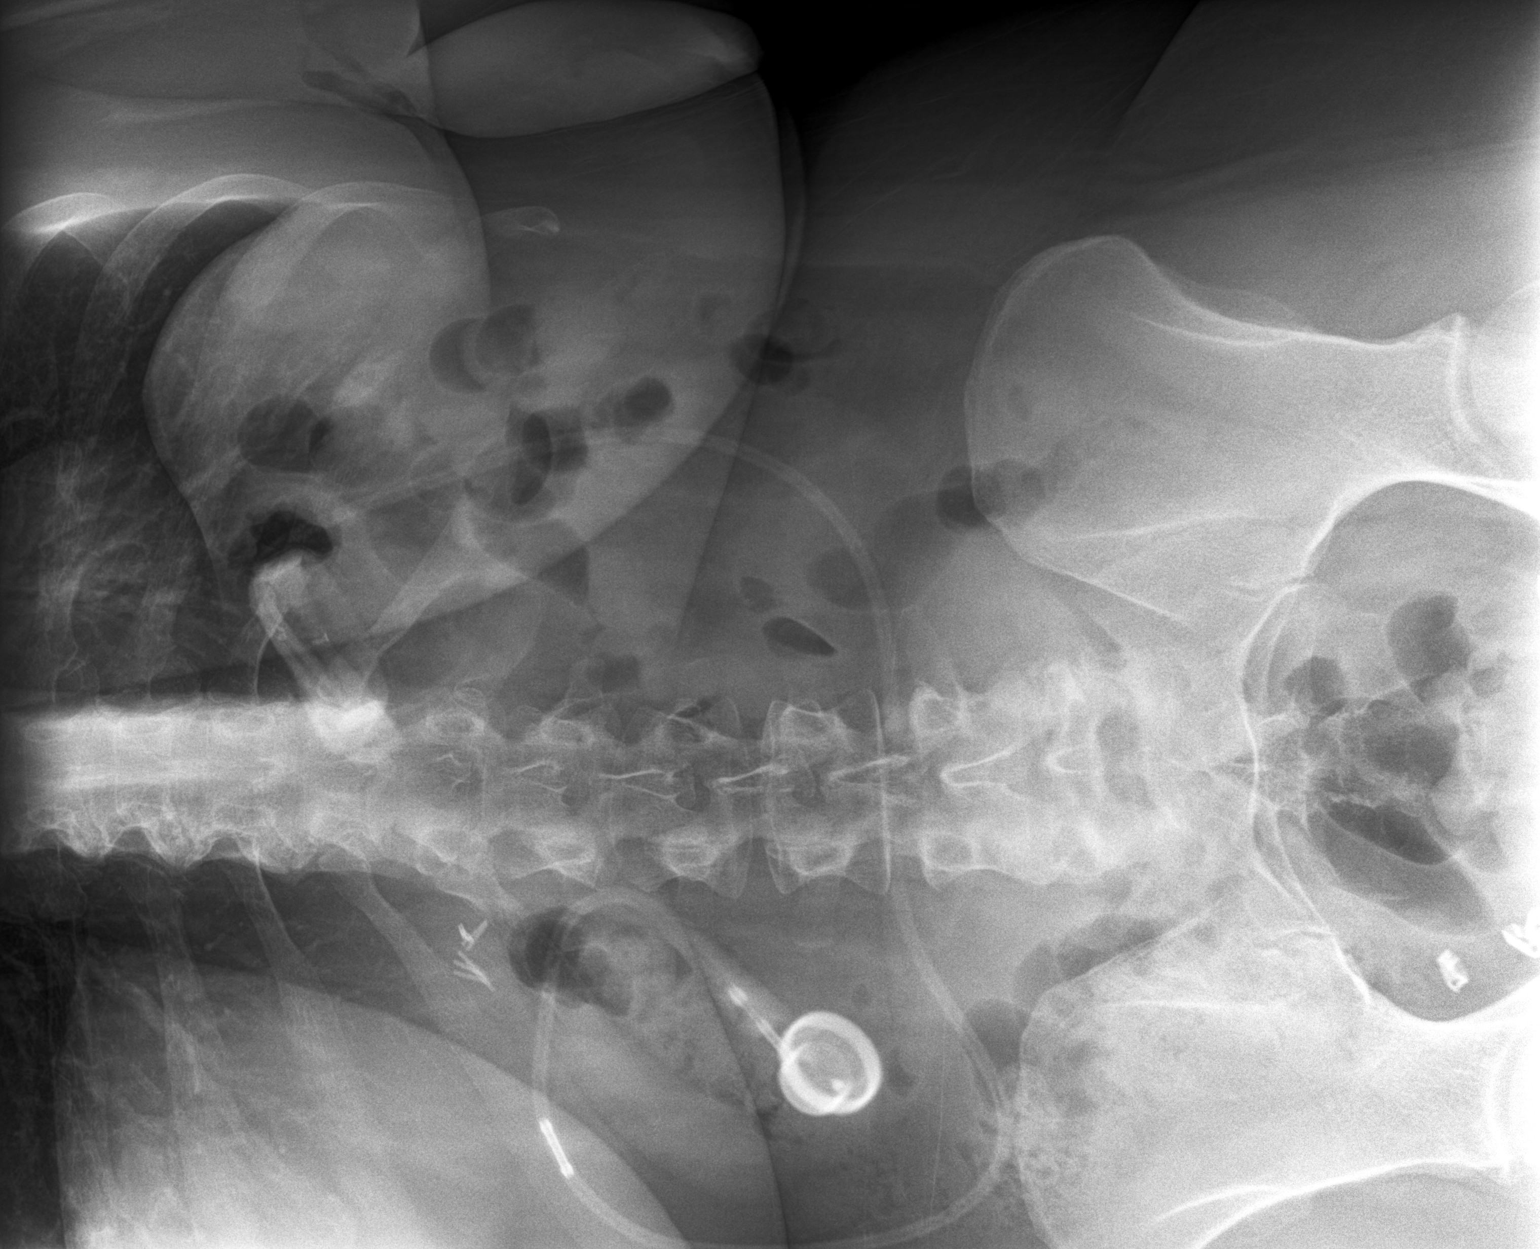

[3 of 3 positions shown; findings below may reference images not displayed]

FINDINGS: The bowel gas pattern is normal. There is no evidence of free air.
No radio-opaque calculi or other significant radiographic
abnormality is seen. Status post cholecystectomy. Lap band device is
unchanged in position. Surgical clips are noted in the pelvis.
IMPRESSION: No evidence of bowel obstruction or ileus.

## 2018-06-12 NOTE — Progress Notes (Signed)
Subjective:    Patient ID: Sharon Fowler, female    DOB: September 09, 1971, 47 y.o.   MRN: [id number]  No chief complaint on file.   HPI Patient was seen today for chronic issues.  Pt endorses continued vaginal odor x >1 yr.  Has not f/u with Ob/Gyn.  Pt tried cornstarch which does not seem to help.  Also using a moisture wicking towel and wiping with a wet paper towel after urination.  Pt feels her co-workers notice an odor when she walks by.  Pt denies dysuria, constipation, urinary frequency, vaginal d/c, vaginal irritation.  Pt has a h/o hysterectomy. Also with weight gain s/p removal of gastric band 2/2 erosion.  Past Medical History:  Diagnosis Date  . ANEMIA, IRON DEFICIENCY UNSPEC 06/11/2009   corrected with iron replacement---menstrual reelated  . GOITER, MULTINODULAR 07/15/2009  . HYPERLIPIDEMIA 03/25/2008  . HYPERTENSION 03/25/2008  . OBESITY 03/25/2008    Allergies  Allergen Reactions  . Chlorthalidone Other (See Comments)    REACTION: hypokalemia (2.8)    ROS General: Denies fever, chills, night sweats, changes in weight, changes in appetite HEENT: Denies headaches, ear pain, changes in vision, rhinorrhea, sore throat CV: Denies CP, palpitations, SOB, orthopnea Pulm: Denies SOB, cough, wheezing GI: Denies abdominal pain, nausea, vomiting, diarrhea, constipation GU: Denies dysuria, hematuria, frequency, vaginal discharge   +vaginal odor Msk: Denies muscle cramps, joint pains Neuro: Denies weakness, numbness, tingling Skin: Denies rashes, bruising Psych: Denies depression, anxiety, hallucinations     Objective:    Blood pressure (!) 142/82, pulse 100, temperature 98.3 F (36.8 C), temperature source Oral, weight 284 lb (128.8 kg), last menstrual period 05/08/2011, SpO2 97 %.   Gen. Pleasant, well-nourished, in no distress, normal affect   HEENT: /AT, face symmetric, nares patent without drainage Lungs: no accessory muscle use Cardiovascular: RRR, no peripheral  edema Neuro:  A&Ox3, CN II-XII intact, normal gait GU: UA obtained   Wt Readings from Last 3 Encounters:  06/12/18 284 lb (128.8 kg)  02/14/18 262 lb 9.6 oz (119.1 kg)  01/16/18 254 lb (115.2 kg)    Lab Results  Component Value Date   WBC 8.9 02/14/2018   HGB 13.4 02/14/2018   HCT 41.2 02/14/2018   PLT 316.0 02/14/2018   GLUCOSE 91 02/14/2018   CHOL 244 (H) 02/14/2018   TRIG 77.0 02/14/2018   HDL 60.70 02/14/2018   LDLDIRECT 197.8 10/15/2013   LDLCALC 168 (H) 02/14/2018   ALT 28 08/21/2017   AST 36 08/21/2017   NA 138 02/14/2018   K 3.7 02/14/2018   CL 103 02/14/2018   CREATININE 0.55 02/14/2018   BUN 18 02/14/2018   CO2 27 02/14/2018   TSH 2.28 02/14/2018   HGBA1C 5.7 02/14/2018    Assessment/Plan:  Vaginal odor  -no odor noted -ddx: excessive sweating/bacterial overgrowth on skin worsened by wt gain, STI, PID, hidradenitis, constipation, UTI. -encouraged to increase po intake of water as urine concentrated, no leuks, trace blood. -encouraged to decrease intake of broccoli to see if this helps. -pt encouraged to f/u with OB/Gyn -mentioned botox for excessive sweating, however pt declines - Plan: POCT Urinalysis Dipstick (Automated)  F/u prn  Grier Mitts, MD

## 2018-06-13 MED FILL — CLINDAMYCIN HCL 300 MG CAP: 300 | 5 days supply | Qty: 15 | Fill #0

## 2018-06-27 MED FILL — LOSARTAN POTASSIUM 25 MG TA: 25 | 90 days supply | Qty: 90 | Fill #0

## 2018-06-27 MED FILL — ATORVASTATIN 10 MG TABLET: 10 | 90 days supply | Qty: 90 | Fill #2

## 2018-07-14 ENCOUNTER — Encounter: Payer: Self-pay | Admitting: Family Medicine

## 2018-09-06 MED FILL — FLUCONAZOLE 150 MG TABS: 150 | 4 days supply | Qty: 2 | Fill #0

## 2019-02-17 ENCOUNTER — Telehealth: Payer: Self-pay | Admitting: *Deleted

## 2019-02-17 ENCOUNTER — Ambulatory Visit (INDEPENDENT_AMBULATORY_CARE_PROVIDER_SITE_OTHER): Payer: No Typology Code available for payment source | Admitting: Family Medicine

## 2019-02-17 ENCOUNTER — Other Ambulatory Visit: Payer: Self-pay

## 2019-02-17 ENCOUNTER — Encounter: Payer: Self-pay | Admitting: Family Medicine

## 2019-02-17 DIAGNOSIS — E785 Hyperlipidemia, unspecified: Secondary | ICD-10-CM

## 2019-02-17 DIAGNOSIS — I10 Essential (primary) hypertension: Secondary | ICD-10-CM | POA: Diagnosis not present

## 2019-02-17 MED ORDER — ATORVASTATIN CALCIUM 10 MG PO TABS: 10.0000 mg | ORAL_TABLET | Freq: Every day | ORAL | 3 refills | Status: DC

## 2019-02-17 MED ORDER — LOSARTAN POTASSIUM 25 MG PO TABS: 25.0000 mg | ORAL_TABLET | Freq: Every day | ORAL | 3 refills | Status: DC

## 2019-02-17 MED FILL — ATORVASTATIN 10 MG TABLET: 10 | 90 days supply | Qty: 90 | Fill #0

## 2019-02-17 MED FILL — LOSARTAN POTASSIUM 25 MG TA: 25 | 30 days supply | Qty: 30 | Fill #0

## 2019-02-17 NOTE — Telephone Encounter (Signed)
Copied from [place] [id number]. Topic: Appointment Scheduling - Scheduling Inquiry for Clinic >> Feb 14, 2019  4:08 PM Virl Axe D wrote: Reason for CRM: Pt called to schedule appointment. Office closed. CB#[phone] (

## 2019-02-17 NOTE — Progress Notes (Signed)
Virtual Visit via Video Note  I connected with Sharon Fowler on 02/17/19 at  3:00 PM EDT by a video enabled telemedicine application and verified that I am speaking with the correct person using two identifiers.  Location patient: home Location provider:work or home office Persons participating in the virtual visit: patient, provider  I discussed the limitations of evaluation and management by telemedicine and the availability of in person appointments. The patient expressed understanding and agreed to proceed.   HPI: Pt states she has not been taking her bp med in "a while" as she would forget to take them.  Pt states a while is at least 6 months.  She felt lightheaded and figured it was her bp.  She called the pharmacy but did not have any refills.  Pt has not checked her bp.  Denies HA, changes in vision, eating increased salt.  Pt recently ordered a bp cuff.    Pt states she is thinking about the gastric sleeve.  Pt had lap band in 2011, which was removed in 2018 as it was eroded into her stomach.  Pt has been unable to get the weight off.  Pt requesting a referral to her surgeon, Dr. Abran Cantor.  Pt advised she would need to be compliant with medications such as vitamins.  Pt states she did not take them consistently after her first procedure.  ROS: See pertinent positives and negatives per HPI.  Past Medical History:  Diagnosis Date  . ANEMIA, IRON DEFICIENCY UNSPEC 06/11/2009   corrected with iron replacement---menstrual reelated  . GOITER, MULTINODULAR 07/15/2009  . HYPERLIPIDEMIA 03/25/2008  . HYPERTENSION 03/25/2008  . OBESITY 03/25/2008    Past Surgical History:  Procedure Laterality Date  . CESAREAN SECTION  7/91, 5/95   x2  . CHOLECYSTECTOMY  10/91   laparascopy  . ESOPHAGOGASTRODUODENOSCOPY (EGD) WITH PROPOFOL N/A 08/16/2017   Procedure: ESOPHAGOGASTRODUODENOSCOPY (EGD) WITH PROPOFOL;  Surgeon: Alphonsa Overall, MD;  Location: [facility];  Service: General;  Laterality:  N/A;  . LAPAROSCOPIC GASTRIC BANDING  2011  . TUBAL LIGATION     01/2003    Family History  Problem Relation Age of Onset  . Diabetes Mother   . Stroke Mother   . Alcohol abuse Father   . Stroke Father     SOCIAL HX:    Current Outpatient Medications:  .  atorvastatin (LIPITOR) 10 MG tablet, Take 1 tablet (10 mg total) by mouth daily., Disp: 90 tablet, Rfl: 3 .  Marc[place] COHOSH PO, Take by mouth., Disp: , Rfl:  .  Cyanocobalamin (B-12 PO), Take 1 tablet by mouth daily. , Disp: , Rfl:  .  Docusate Calcium (STOOL SOFTENER PO), Stool Softener, Disp: , Rfl:  .  estradiol (ESTRACE) 0.1 MG/GM vaginal cream, INSERT 1 G EVERY MONTH BY VAGINAL ROUTE, Disp: , Rfl: 4 .  IRON PO, iron, Disp: , Rfl:  .  ketoconazole (NIZORAL) 2 % cream, ketoconazole 2 % topical cream  APPLY TO THE AFFECTED AREA S  BY TOPICAL ROUTE TWICE DAILY, Disp: , Rfl:  .  losartan (COZAAR) 25 MG tablet, Take 1 tablet (25 mg total) by mouth daily., Disp: 90 tablet, Rfl: 3 .  Multiple Vitamin (MULTIVITAMIN WITH MINERALS) TABS tablet, Take 1 tablet by mouth daily., Disp: , Rfl:   EXAM:  VITALS per patient if applicable:  GENERAL: alert, oriented, appears well and in no acute distress  HEENT: atraumatic, conjunctiva clear, no obvious abnormalities on inspection of external nose and ears  NECK: normal movements  of the head and neck  LUNGS: no signs of respiratory distress, breathing rate appears normal, no obvious gross SOB, gasping or wheezing  CV: no obvious cyanosis  MS: moves all visible extremities without noticeable abnormality  PSYCH/NEURO: pleasant and cooperative, no obvious depression or anxiety, speech and thought processing grossly intact  ASSESSMENT AND PLAN:  Discussed the following assessment and plan:  Class 3 severe obesity due to excess calories with serious comorbidity in adult, unspecified BMI ([place])  -pt encouraged to continue lifestyle modifications -pt advised that taking medications on a  regular basis would be a neccessary part of having any gastric procedure. - Plan: Ambulatory referral to General Surgery  Essential hypertension -pt advised to start checking bp daily  -lifestyle modifications strongly encouraged. - Plan: losartan (COZAAR) 25 MG tablet  Hyperlipidemia, unspecified hyperlipidemia type  - Plan: atorvastatin (LIPITOR) 10 MG tablet  I discussed the assessment and treatment plan with the patient. The patient was provided an opportunity to ask questions and all were answered. The patient agreed with the plan and demonstrated an understanding of the instructions.   The patient was advised to call back or seek an in-person evaluation if the symptoms worsen or if the condition fails to improve as anticipated.   Billie Ruddy, MD

## 2019-02-17 NOTE — Telephone Encounter (Signed)
Pt has been scheduled for a webex appointment with dr Volanda Napoleon this afternoon at 3 pm

## 2019-03-03 MED FILL — DRYSOL DAB-O-MATIC SOLUTION: 20 | 30 days supply | Qty: 35 | Fill #0

## 2019-03-03 MED FILL — CEPHALEXIN 500 MG CAPSULE: 500 | 7 days supply | Qty: 14 | Fill #0

## 2019-03-06 MED FILL — VITAMIN D3 50000 UNIT CAPS: 1.25 MG | 56 days supply | Qty: 8 | Fill #0

## 2019-03-07 MED FILL — AMOX-CLAV 875-125 MG TABLET: 875-125 | 7 days supply | Qty: 14 | Fill #0

## 2019-03-12 ENCOUNTER — Telehealth: Payer: Self-pay | Admitting: Family Medicine

## 2019-03-12 NOTE — Telephone Encounter (Signed)
Copied from [place]. Topic: Referral - Request for Referral >> Mar 12, 2019  1:35 PM Sheran Luz wrote: Has patient seen PCP for this complaint? No Referral for which specialty: Urology  Preferred provider/office: [facility] Reason for referral: Urinary incontinence

## 2019-03-13 ENCOUNTER — Other Ambulatory Visit: Payer: Self-pay | Admitting: Family Medicine

## 2019-03-13 DIAGNOSIS — R32 Unspecified urinary incontinence: Secondary | ICD-10-CM

## 2019-03-13 NOTE — Telephone Encounter (Signed)
ok 

## 2019-03-13 NOTE — Telephone Encounter (Signed)
Ok for referral?

## 2019-03-13 NOTE — Telephone Encounter (Signed)
Referral to Urology was placed and pt is aware.

## 2019-03-22 MED FILL — MYRBETRIQ ER 50 MG TABLET: 50 | 30 days supply | Qty: 30 | Fill #0

## 2019-03-31 ENCOUNTER — Other Ambulatory Visit: Payer: Self-pay

## 2019-03-31 ENCOUNTER — Encounter: Payer: Self-pay | Admitting: Family Medicine

## 2019-03-31 ENCOUNTER — Ambulatory Visit (INDEPENDENT_AMBULATORY_CARE_PROVIDER_SITE_OTHER): Payer: No Typology Code available for payment source | Admitting: Family Medicine

## 2019-03-31 DIAGNOSIS — I10 Essential (primary) hypertension: Secondary | ICD-10-CM

## 2019-03-31 DIAGNOSIS — R32 Unspecified urinary incontinence: Secondary | ICD-10-CM

## 2019-03-31 MED ORDER — LOSARTAN POTASSIUM 50 MG PO TABS: 50.0000 mg | ORAL_TABLET | Freq: Every day | ORAL | 4 refills | Status: DC

## 2019-03-31 NOTE — Progress Notes (Signed)
Virtual Visit via Video Note  I connected with Sharon Paradise. Fowler on 03/31/19 at  4:30 PM EDT by a video enabled telemedicine application and verified that I am speaking with the correct person using two identifiers.  Location patient: car Location provider:work or home office Persons participating in the virtual visit: patient, provider.  Pt's daughter also in the car.  I discussed the limitations of evaluation and management by telemedicine and the availability of in person appointments. The patient expressed understanding and agreed to proceed.   HPI: Pt following up on bp.  Since last OFV, pt was seen by Urology for incontinence.  Urology started pt on Myrbetriq. Pt's OB/Gyn recommended pt start vesicare.  Pt states she does not wish to take Vesicare as she read it can cause memory issues. Pt already has issues with short term memory and has a family hx of dementia.  Pt also read Myrvetriq can affect bp,  Pt has been taking losartan 25 mg daily.  She states she checked her bp yesterday and it was 150/117.  Pt has not been checking bp regularly.  Pt states she was going to double the dose but will run out of medication.  Pt denies HAs, CP, changes in vision.   ROS: See pertinent positives and negatives per HPI.  Past Medical History:  Diagnosis Date  . ANEMIA, IRON DEFICIENCY UNSPEC 06/11/2009   corrected with iron replacement---menstrual reelated  . GOITER, MULTINODULAR 07/15/2009  . HYPERLIPIDEMIA 03/25/2008  . HYPERTENSION 03/25/2008  . OBESITY 03/25/2008    Past Surgical History:  Procedure Laterality Date  . CESAREAN SECTION  7/91, 5/95   x2  . CHOLECYSTECTOMY  10/91   laparascopy  . ESOPHAGOGASTRODUODENOSCOPY (EGD) WITH PROPOFOL N/A 08/16/2017   Procedure: ESOPHAGOGASTRODUODENOSCOPY (EGD) WITH PROPOFOL;  Surgeon: Alphonsa Overall, MD;  Location: [facility];  Service: General;  Laterality: N/A;  . LAPAROSCOPIC GASTRIC BANDING  2011  . TUBAL LIGATION     01/2003    Family  History  Problem Relation Age of Onset  . Diabetes Mother   . Stroke Mother   . Alcohol abuse Father   . Stroke Father     SOCIAL HX:    Current Outpatient Medications:  .  atorvastatin (LIPITOR) 10 MG tablet, Take 1 tablet (10 mg total) by mouth daily., Disp: 90 tablet, Rfl: 3 .  Banet COHOSH PO, Take by mouth., Disp: , Rfl:  .  Cyanocobalamin (B-12 PO), Take 1 tablet by mouth daily. , Disp: , Rfl:  .  Docusate Calcium (STOOL SOFTENER PO), Stool Softener, Disp: , Rfl:  .  estradiol (ESTRACE) 0.1 MG/GM vaginal cream, INSERT 1 G EVERY MONTH BY VAGINAL ROUTE, Disp: , Rfl: 4 .  IRON PO, iron, Disp: , Rfl:  .  ketoconazole (NIZORAL) 2 % cream, ketoconazole 2 % topical cream  APPLY TO THE AFFECTED AREA S  BY TOPICAL ROUTE TWICE DAILY, Disp: , Rfl:  .  losartan (COZAAR) 25 MG tablet, Take 1 tablet (25 mg total) by mouth daily., Disp: 30 tablet, Rfl: 3 .  Multiple Vitamin (MULTIVITAMIN WITH MINERALS) TABS tablet, Take 1 tablet by mouth daily., Disp: , Rfl:   EXAM:  VITALS per patient if applicable:  RR between 12-20 bpm  GENERAL: alert, oriented, appears well and in no acute distress  HEENT: atraumatic, conjunctiva clear, no obvious abnormalities on inspection of external nose and ears  NECK: normal movements of the head and neck  LUNGS: on inspection no signs of respiratory distress, breathing rate appears  normal, no obvious gross SOB, gasping or wheezing  CV: no obvious cyanosis  MS: moves all visible extremities without noticeable abnormality  PSYCH/NEURO: pleasant and cooperative, no obvious depression or anxiety, speech and thought processing grossly intact  ASSESSMENT AND PLAN:  Discussed the following assessment and plan:  Essential hypertension  -uncontrolled -lifestyle modifications strongly encouraged. -pt advised to check bp regularly.  Pt to send bp readings in via mychart. -will increase losartan from 25 mg to 50 mg - Plan: losartan (COZAAR) 50 MG  tablet  Urinary incontinence, unspecified type -continue Myrbetriq -continue f/u with Urology   f/u in 2-4 wks  I discussed the assessment and treatment plan with the patient. The patient was provided an opportunity to ask questions and all were answered. The patient agreed with the plan and demonstrated an understanding of the instructions.   The patient was advised to call back or seek an in-person evaluation if the symptoms worsen or if the condition fails to improve as anticipated.   Billie Ruddy, MD

## 2019-04-28 ENCOUNTER — Encounter: Payer: Self-pay | Admitting: Family Medicine

## 2019-04-28 MED FILL — ATORVASTATIN 10 MG TABLET: 10 | 90 days supply | Qty: 90 | Fill #0

## 2019-04-28 MED FILL — LOSARTAN POTASSIUM 50 MG TA: 50 | 90 days supply | Qty: 90 | Fill #0

## 2019-04-29 MED FILL — HYDROCHLOROTHIAZIDE 25 MG T: 25 | 7 days supply | Qty: 7 | Fill #0

## 2019-04-29 MED FILL — OXYBUTYNIN 5 MG TABLET: 5 | 30 days supply | Qty: 60 | Fill #0

## 2019-05-05 ENCOUNTER — Telehealth: Payer: Self-pay

## 2019-05-05 NOTE — Telephone Encounter (Signed)
Patient called in stating that she has a clearance form for bariatric surgery. She stated that she has spoken with Dr. Volanda Napoleon about this previously.  She would like to drop the form off for completion. I informed the patient that she is over due for her physical and that an appointment may be required before the form can be completed.   Please advise. Patient is waiting for a return call.

## 2019-05-06 NOTE — Telephone Encounter (Signed)
Please advise 

## 2019-05-06 NOTE — Telephone Encounter (Signed)
Pt can drop off form for review as she may need an appointment for further eval.

## 2019-05-07 NOTE — Telephone Encounter (Signed)
Spoke with pt states that she will drop off the surgical clearance forms in the morning, verbalized understanding that she may need an appointment for further eval

## 2019-05-08 MED FILL — CEPHALEXIN 500 MG CAPSULE: 500 | 7 days supply | Qty: 14 | Fill #0

## 2019-05-13 ENCOUNTER — Other Ambulatory Visit: Payer: Self-pay

## 2019-05-13 NOTE — Telephone Encounter (Signed)
Pt request for her referral order be printed and she will pick up paperwork from the office on 05/14/2019

## 2019-05-14 ENCOUNTER — Encounter: Payer: Self-pay | Admitting: Family Medicine

## 2019-05-19 NOTE — Telephone Encounter (Signed)
Spoke with pt this morning aware to pick up her surgical forms in the office, pt states that she will pick up at 1 pm today. Form placed in the front office cabinet

## 2019-05-26 MED FILL — PENICILLIN VK 500 MG TABLET: 500 | 7 days supply | Qty: 14 | Fill #0

## 2019-06-04 ENCOUNTER — Encounter: Payer: Self-pay | Admitting: Family Medicine

## 2019-06-04 ENCOUNTER — Other Ambulatory Visit: Payer: Self-pay

## 2019-06-04 ENCOUNTER — Ambulatory Visit (INDEPENDENT_AMBULATORY_CARE_PROVIDER_SITE_OTHER): Payer: No Typology Code available for payment source | Admitting: Family Medicine

## 2019-06-04 VITALS — BP 160/90 | HR 81 | Temp 98.4°F | Wt 300.2 lb

## 2019-06-04 DIAGNOSIS — R609 Edema, unspecified: Secondary | ICD-10-CM | POA: Diagnosis not present

## 2019-06-04 DIAGNOSIS — R600 Localized edema: Secondary | ICD-10-CM

## 2019-06-04 DIAGNOSIS — Z6841 Body Mass Index (BMI) 40.0 and over, adult: Secondary | ICD-10-CM | POA: Diagnosis not present

## 2019-06-04 DIAGNOSIS — I10 Essential (primary) hypertension: Secondary | ICD-10-CM | POA: Diagnosis not present

## 2019-06-04 MED ORDER — LOSARTAN POTASSIUM 100 MG PO TABS
100.0000 mg | ORAL_TABLET | Freq: Every day | ORAL | 4 refills | Status: DC
Start: 2019-06-04 — End: 2020-03-15

## 2019-06-04 MED FILL — LOSARTAN POTASSIUM 100 MG T: 100 | 30 days supply | Qty: 30 | Fill #0

## 2019-06-04 NOTE — Patient Instructions (Signed)
Edema  Edema is an abnormal buildup of fluids in the body tissues and under the skin. Swelling of the legs, feet, and ankles is a common symptom that becomes more likely as you get older. Swelling is also common in looser tissues, like around the eyes. When the affected area is squeezed, the fluid may move out of that spot and leave a dent for a few moments. This dent is called pitting edema. There are many possible causes of edema. Eating too much salt (sodium) and being on your feet or sitting for a long time can cause edema in your legs, feet, and ankles. Hot weather may make edema worse. Common causes of edema include:  Heart failure.  Liver or kidney disease.  Weak leg blood vessels.  Cancer.  An injury.  Pregnancy.  Medicines.  Being obese.  Low protein levels in the blood. Edema is usually painless. Your skin may look swollen or shiny. Follow these instructions at home:  Keep the affected body part raised (elevated) above the level of your heart when you are sitting or lying down.  Do not sit still or stand for long periods of time.  Do not wear tight clothing. Do not wear garters on your upper legs.  Exercise your legs to get your circulation going. This helps to move the fluid back into your blood vessels, and it may help the swelling go down.  Wear elastic bandages or support stockings to reduce swelling as told by your health care provider.  Eat a low-salt (low-sodium) diet to reduce fluid as told by your health care provider.  Depending on the cause of your swelling, you may need to limit how much fluid you drink (fluid restriction).  Take over-the-counter and prescription medicines only as told by your health care provider. Contact a health care provider if:  Your edema does not get better with treatment.  You have heart, liver, or kidney disease and have symptoms of edema.  You have sudden and unexplained weight gain. Get help right away if:  You develop  shortness of breath or chest pain.  You cannot breathe when you lie down.  You develop pain, redness, or warmth in the swollen areas.  You have heart, liver, or kidney disease and suddenly get edema.  You have a fever and your symptoms suddenly get worse. Summary  Edema is an abnormal buildup of fluids in the body tissues and under the skin.  Eating too much salt (sodium) and being on your feet or sitting for a long time can cause edema in your legs, feet, and ankles.  Keep the affected body part raised (elevated) above the level of your heart when you are sitting or lying down. This information is not intended to replace advice given to you by your health care provider. Make sure you discuss any questions you have with your health care provider. Document Released: 10/16/2005 Document Revised: 10/19/2017 Document Reviewed: 11/18/2016 Elsevier Patient Education  [place].  Low-Sodium Eating Plan Sodium, which is an element that makes up salt, helps you maintain a healthy balance of fluids in your body. Too much sodium can increase your blood pressure and cause fluid and waste to be held in your body. Your health care provider or dietitian may recommend following this plan if you have high blood pressure (hypertension), kidney disease, liver disease, or heart failure. Eating less sodium can help lower your blood pressure, reduce swelling, and protect your heart, liver, and kidneys. What are tips for  following this plan? General guidelines  Most people on this plan should limit their sodium intake to 1,500-2,000 mg (milligrams) of sodium each day. Reading food labels   The Nutrition Facts label lists the amount of sodium in one serving of the food. If you eat more than one serving, you must multiply the listed amount of sodium by the number of servings.  Choose foods with less than 140 mg of sodium per serving.  Avoid foods with 300 mg of sodium or more per serving. Shopping   Look for lower-sodium products, often labeled as "low-sodium" or "no salt added."  Always check the sodium content even if foods are labeled as "unsalted" or "no salt added".  Buy fresh foods. ? Avoid canned foods and premade or frozen meals. ? Avoid canned, cured, or processed meats  Buy breads that have less than 80 mg of sodium per slice. Cooking  Eat more home-cooked food and less restaurant, buffet, and fast food.  Avoid adding salt when cooking. Use salt-free seasonings or herbs instead of table salt or sea salt. Check with your health care provider or pharmacist before using salt substitutes.  Cook with plant-based oils, such as canola, sunflower, or olive oil. Meal planning  When eating at a restaurant, ask that your food be prepared with less salt or no salt, if possible.  Avoid foods that contain MSG (monosodium glutamate). MSG is sometimes added to [place] food, bouillon, and some canned foods. What foods are recommended? The items listed may not be a complete list. Talk with your dietitian about what dietary choices are best for you. Grains Low-sodium cereals, including oats, puffed wheat and rice, and shredded wheat. Low-sodium crackers. Unsalted rice. Unsalted pasta. Low-sodium bread. Whole-grain breads and whole-grain pasta. Vegetables Fresh or frozen vegetables. "No salt added" canned vegetables. "No salt added" tomato sauce and paste. Low-sodium or reduced-sodium tomato and vegetable juice. Fruits Fresh, frozen, or canned fruit. Fruit juice. Meats and other protein foods Fresh or frozen (no salt added) meat, poultry, seafood, and fish. Low-sodium canned tuna and salmon. Unsalted nuts. Dried peas, beans, and lentils without added salt. Unsalted canned beans. Eggs. Unsalted nut butters. Dairy Milk. Soy milk. Cheese that is naturally low in sodium, such as ricotta cheese, fresh mozzarella, or Swiss cheese Low-sodium or reduced-sodium cheese. Cream cheese. Yogurt.  Fats and oils Unsalted butter. Unsalted margarine with no trans fat. Vegetable oils such as canola or olive oils. Seasonings and other foods Fresh and dried herbs and spices. Salt-free seasonings. Low-sodium mustard and ketchup. Sodium-free salad dressing. Sodium-free light mayonnaise. Fresh or refrigerated horseradish. Lemon juice. Vinegar. Homemade, reduced-sodium, or low-sodium soups. Unsalted popcorn and pretzels. Low-salt or salt-free chips. What foods are not recommended? The items listed may not be a complete list. Talk with your dietitian about what dietary choices are best for you. Grains Instant hot cereals. Bread stuffing, pancake, and biscuit mixes. Croutons. Seasoned rice or pasta mixes. Noodle soup cups. Boxed or frozen macaroni and cheese. Regular salted crackers. Self-rising flour. Vegetables Sauerkraut, pickled vegetables, and relishes. Olives. [place] fries. Onion rings. Regular canned vegetables (not low-sodium or reduced-sodium). Regular canned tomato sauce and paste (not low-sodium or reduced-sodium). Regular tomato and vegetable juice (not low-sodium or reduced-sodium). Frozen vegetables in sauces. Meats and other protein foods Meat or fish that is salted, canned, smoked, spiced, or pickled. Bacon, ham, sausage, hotdogs, corned beef, chipped beef, packaged lunch meats, salt pork, jerky, pickled herring, anchovies, regular canned tuna, sardines, salted nuts. Dairy Processed cheese and cheese  spreads. Cheese curds. Blue cheese. Feta cheese. String cheese. Regular cottage cheese. Buttermilk. Canned milk. Fats and oils Salted butter. Regular margarine. Ghee. Bacon fat. Seasonings and other foods Onion salt, garlic salt, seasoned salt, table salt, and sea salt. Canned and packaged gravies. Worcestershire sauce. Tartar sauce. Barbecue sauce. Teriyaki sauce. Soy sauce, including reduced-sodium. Steak sauce. Fish sauce. Oyster sauce. Cocktail sauce. Horseradish that you find on the  shelf. Regular ketchup and mustard. Meat flavorings and tenderizers. Bouillon cubes. Hot sauce and Tabasco sauce. Premade or packaged marinades. Premade or packaged taco seasonings. Relishes. Regular salad dressings. Salsa. Potato and tortilla chips. Corn chips and puffs. Salted popcorn and pretzels. Canned or dried soups. Pizza. Frozen entrees and pot pies. Summary  Eating less sodium can help lower your blood pressure, reduce swelling, and protect your heart, liver, and kidneys.  Most people on this plan should limit their sodium intake to 1,500-2,000 mg (milligrams) of sodium each day.  Canned, boxed, and frozen foods are high in sodium. Restaurant foods, fast foods, and pizza are also very high in sodium. You also get sodium by adding salt to food.  Try to cook at home, eat more fresh fruits and vegetables, and eat less fast food, canned, processed, or prepared foods. This information is not intended to replace advice given to you by your health care provider. Make sure you discuss any questions you have with your health care provider. Document Released: 04/07/2002 Document Revised: 09/28/2017 Document Reviewed: 10/09/2016 Elsevier Patient Education  2020 [employer].  Managing Your Hypertension Hypertension is commonly called high blood pressure. This is when the force of your blood pressing against the walls of your arteries is too strong. Arteries are blood vessels that carry blood from your heart throughout your body. Hypertension forces the heart to work harder to pump blood, and may cause the arteries to become narrow or stiff. Having untreated or uncontrolled hypertension can cause heart attack, stroke, kidney disease, and other problems. What are blood pressure readings? A blood pressure reading consists of a higher number over a lower number. Ideally, your blood pressure should be below 120/80. The first ("top") number is called the systolic pressure. It is a measure of the pressure  in your arteries as your heart beats. The second ("bottom") number is called the diastolic pressure. It is a measure of the pressure in your arteries as the heart relaxes. What does my blood pressure reading mean? Blood pressure is classified into four stages. Based on your blood pressure reading, your health care provider may use the following stages to determine what type of treatment you need, if any. Systolic pressure and diastolic pressure are measured in a unit called mm Hg. Normal  Systolic pressure: below 945.  Diastolic pressure: below 80. Elevated  Systolic pressure: 859-292.  Diastolic pressure: below 80. Hypertension stage 1  Systolic pressure: 446-286.  Diastolic pressure: 38-17. Hypertension stage 2  Systolic pressure: 711 or above.  Diastolic pressure: 90 or above. What health risks are associated with hypertension? Managing your hypertension is an important responsibility. Uncontrolled hypertension can lead to:  A heart attack.  A stroke.  A weakened blood vessel (aneurysm).  Heart failure.  Kidney damage.  Eye damage.  Metabolic syndrome.  Memory and concentration problems. What changes can I make to manage my hypertension? Hypertension can be managed by making lifestyle changes and possibly by taking medicines. Your health care provider will help you make a plan to bring your blood pressure within a normal range. Eating  and drinking   Eat a diet that is high in fiber and potassium, and low in salt (sodium), added sugar, and fat. An example eating plan is called the DASH (Dietary Approaches to Stop Hypertension) diet. To eat this way: ? Eat plenty of fresh fruits and vegetables. Try to fill half of your plate at each meal with fruits and vegetables. ? Eat whole grains, such as whole wheat pasta, brown rice, or whole grain bread. Fill about one quarter of your plate with whole grains. ? Eat low-fat diary products. ? Avoid fatty cuts of meat, processed  or cured meats, and poultry with skin. Fill about one quarter of your plate with lean proteins such as fish, chicken without skin, beans, eggs, and tofu. ? Avoid premade and processed foods. These tend to be higher in sodium, added sugar, and fat.  Reduce your daily sodium intake. Most people with hypertension should eat less than 1,500 mg of sodium a day.  Limit alcohol intake to no more than 1 drink a day for nonpregnant women and 2 drinks a day for men. One drink equals 12 oz of beer, 5 oz of wine, or 1 oz of hard liquor. Lifestyle  Work with your health care provider to maintain a healthy body weight, or to lose weight. Ask what an ideal weight is for you.  Get at least 30 minutes of exercise that causes your heart to beat faster (aerobic exercise) most days of the week. Activities may include walking, swimming, or biking.  Include exercise to strengthen your muscles (resistance exercise), such as weight lifting, as part of your weekly exercise routine. Try to do these types of exercises for 30 minutes at least 3 days a week.  Do not use any products that contain nicotine or tobacco, such as cigarettes and e-cigarettes. If you need help quitting, ask your health care provider.  Control any long-term (chronic) conditions you have, such as high cholesterol or diabetes. Monitoring  Monitor your blood pressure at home as told by your health care provider. Your personal target blood pressure may vary depending on your medical conditions, your age, and other factors.  Have your blood pressure checked regularly, as often as told by your health care provider. Working with your health care provider  Review all the medicines you take with your health care provider because there may be side effects or interactions.  Talk with your health care provider about your diet, exercise habits, and other lifestyle factors that may be contributing to hypertension.  Visit your health care provider  regularly. Your health care provider can help you create and adjust your plan for managing hypertension. Will I need medicine to control my blood pressure? Your health care provider may prescribe medicine if lifestyle changes are not enough to get your blood pressure under control, and if:  Your systolic blood pressure is 130 or higher.  Your diastolic blood pressure is 80 or higher. Take medicines only as told by your health care provider. Follow the directions carefully. Blood pressure medicines must be taken as prescribed. The medicine does not work as well when you skip doses. Skipping doses also puts you at risk for problems. Contact a health care provider if:  You think you are having a reaction to medicines you have taken.  You have repeated (recurrent) headaches.  You feel dizzy.  You have swelling in your ankles.  You have trouble with your vision. Get help right away if:  You develop a severe headache or  confusion.  You have unusual weakness or numbness, or you feel faint.  You have severe pain in your chest or abdomen.  You vomit repeatedly.  You have trouble breathing. Summary  Hypertension is when the force of blood pumping through your arteries is too strong. If this condition is not controlled, it may put you at risk for serious complications.  Your personal target blood pressure may vary depending on your medical conditions, your age, and other factors. For most people, a normal blood pressure is less than 120/80.  Hypertension is managed by lifestyle changes, medicines, or both. Lifestyle changes include weight loss, eating a healthy, low-sodium diet, exercising more, and limiting alcohol. This information is not intended to replace advice given to you by your health care provider. Make sure you discuss any questions you have with your health care provider. Document Released: 07/10/2012 Document Revised: 02/07/2019 Document Reviewed: 09/13/2016 Elsevier Patient  Education  2020 [employer].

## 2019-06-04 NOTE — Progress Notes (Signed)
Subjective:    Patient ID: Sharon Fowler, female    DOB: 1970/11/03, 48 y.o.   MRN: [id number]  Chief Complaint  Patient presents with  . surgical clearance     HPI Patient was seen today for f/u on HTN.  Pt endorses elevated bp. Taking losartan 50 mg daily.  Bp has been 170/120.  Lisinopril caused dry cough, chlorthalidone caused hypokalemia.  Per pt she was not on potassium supplement at time of chlorthalidone use.   Also notes feeling "winded" with exertion.  Notices after going up 14-15 steps.  Pt also has edema in LEs and hands, noticed more at the end of the day.  Does not endorse increased sodium intake. Pt states she was put on a 7 day course of HCTZ by Everett Graff at [facility] to see if the edema would improve.  Myrbetriq d/c'd and now on Ditropan BID.  Pt has an appt on 8/20 to discuss wt loss surgery options: sleeve vs bypass.  Pt considering the sleeve as it is possibly reversible.  Pt s/p lap band removal 2/2 erosion into stomach.  Past Medical History:  Diagnosis Date  . ANEMIA, IRON DEFICIENCY UNSPEC 06/11/2009   corrected with iron replacement---menstrual reelated  . GOITER, MULTINODULAR 07/15/2009  . HYPERLIPIDEMIA 03/25/2008  . HYPERTENSION 03/25/2008  . OBESITY 03/25/2008    Allergies  Allergen Reactions  . Chlorthalidone Other (See Comments)    REACTION: hypokalemia (2.8)    ROS General: Denies fever, chills, night sweats, changes in appetite  +weight gain HEENT: Denies headaches, ear pain, changes in vision, rhinorrhea, sore throat CV: Denies CP, palpitations, SOB, orthopnea Pulm: Denies SOB, cough, wheezing GI: Denies abdominal pain, nausea, vomiting, diarrhea, constipation GU: Denies dysuria, hematuria, frequency, vaginal discharge Msk: Denies muscle cramps, joint pains  +edema Neuro: Denies weakness, numbness, tingling Skin: Denies rashes, bruising Psych: Denies depression, anxiety, hallucinations     Objective:    Blood pressure (!)  160/90, pulse 81, temperature 98.4 F (36.9 C), temperature source Oral, weight (!) 300 lb 3.2 oz (136.2 kg), last menstrual period 05/08/2011, SpO2 96 %.   Gen. Pleasant, well-nourished, obese, in no distress, normal affect   HEENT: [place]/AT, face symmetric, conjunctiva clear, no scleral icterus, PERRLA, EOMI, nares patent without drainage Lungs: no accessory muscle use, CTAB, no wheezes or rales Cardiovascular: RRR, no m/r/g, trace edema up to b/l shin Abdomen: BS present, soft, NT/ND Musculoskeletal: No deformities, no cyanosis or clubbing, normal tone.  No TTP or edema of b/l calves Neuro:  A&Ox3, CN II-XII intact, normal gait Skin:  Warm, no lesions/ rash   Wt Readings from Last 3 Encounters:  06/04/19 (!) 300 lb 3.2 oz (136.2 kg)  06/12/18 284 lb (128.8 kg)  02/14/18 262 lb 9.6 oz (119.1 kg)    Lab Results  Component Value Date   WBC 8.9 02/14/2018   HGB 13.4 02/14/2018   HCT 41.2 02/14/2018   PLT 316.0 02/14/2018   GLUCOSE 91 02/14/2018   CHOL 244 (H) 02/14/2018   TRIG 77.0 02/14/2018   HDL 60.70 02/14/2018   LDLDIRECT 197.8 10/15/2013   LDLCALC 168 (H) 02/14/2018   ALT 28 08/21/2017   AST 36 08/21/2017   NA 138 02/14/2018   K 3.7 02/14/2018   CL 103 02/14/2018   CREATININE 0.55 02/14/2018   BUN 18 02/14/2018   CO2 27 02/14/2018   TSH 2.28 02/14/2018   HGBA1C 5.7 02/14/2018    Assessment/Plan:  Essential hypertension  -elevated -will d/c losartan 50  mg.  Will start increased dose -discussed lifestyle modifications - Plan: losartan (COZAAR) 100 MG tablet, Comprehensive metabolic panel  Class 3 severe obesity due to excess calories with serious comorbidity and body mass index (BMI) of 50.0 to 59.9 in adult [facility]) -discussed lifestyle modifications -pt encouraged to increase physical activity -keep f/u with bariatric surgery  Peripheral edema -discussed possible causes including -pt encouraged to elevated LEs when sitting, decreased sodium intake -will  obtain labs - Plan: Comprehensive metabolic panel, CBC (no diff), Brain Natriuretic Peptide  F/u in 2-4 wks for HTN  Grier Mitts, MD

## 2019-06-05 ENCOUNTER — Encounter: Payer: Self-pay | Admitting: Family Medicine

## 2019-06-05 LAB — COMPREHENSIVE METABOLIC PANEL
ALT: 12 U/L (ref 0–35)
AST: 14 U/L (ref 0–37)
Albumin: 4.1 g/dL (ref 3.5–5.2)
Alkaline Phosphatase: 118 U/L — ABNORMAL HIGH (ref 39–117)
BUN: 11 mg/dL (ref 6–23)
CO2: 28 mEq/L (ref 19–32)
Calcium: 10.1 mg/dL (ref 8.4–10.5)
Chloride: 104 mEq/L (ref 96–112)
Creatinine, Ser: 0.59 mg/dL (ref 0.40–1.20)
GFR: 132.4 mL/min (ref >60.00)
Glucose, Bld: 86 mg/dL (ref 70–99)
Potassium: 4.4 mEq/L (ref 3.5–5.1)
Sodium: 140 mEq/L (ref 135–145)
Total Bilirubin: 0.3 mg/dL (ref 0.2–1.2)
Total Protein: 7.4 g/dL (ref 6.0–8.3)

## 2019-06-05 LAB — CBC
HCT: 42 % (ref 36.0–46.0)
Hemoglobin: 13.5 g/dL (ref 12.0–15.0)
MCHC: 32.2 g/dL (ref 30.0–36.0)
MCV: 82.1 fl (ref 78.0–100.0)
Platelets: 380 10*3/uL (ref 150.0–400.0)
RBC: 5.11 Mil/uL (ref 3.87–5.11)
RDW: 16.2 % — ABNORMAL HIGH (ref 11.5–15.5)
WBC: 8.4 10*3/uL (ref 4.0–10.5)

## 2019-06-05 LAB — BRAIN NATRIURETIC PEPTIDE: Pro B Natriuretic peptide (BNP): 16 pg/mL (ref 0.0–100.0)

## 2019-06-10 ENCOUNTER — Encounter: Payer: Self-pay | Admitting: Family Medicine

## 2019-06-10 MED FILL — OXYBUTYNIN 5 MG TABLET: 5 | 15 days supply | Qty: 30 | Fill #0

## 2019-06-18 MED FILL — AMOXICILLIN 875 MG TABS: 875 | 7 days supply | Qty: 14 | Fill #0

## 2019-06-28 ENCOUNTER — Other Ambulatory Visit: Payer: Self-pay

## 2019-06-28 ENCOUNTER — Encounter (HOSPITAL_COMMUNITY): Payer: Self-pay

## 2019-06-28 ENCOUNTER — Emergency Department (HOSPITAL_COMMUNITY)
Admission: EM | Admit: 2019-06-28 | Discharge: 2019-06-28 | Disposition: A | Discharge status: Home / Self Care | Payer: No Typology Code available for payment source | Attending: Emergency Medicine | Admitting: Emergency Medicine

## 2019-06-28 DIAGNOSIS — Z87891 Personal history of nicotine dependence: Secondary | ICD-10-CM | POA: Diagnosis not present

## 2019-06-28 DIAGNOSIS — G4489 Other headache syndrome: Secondary | ICD-10-CM | POA: Diagnosis not present

## 2019-06-28 DIAGNOSIS — R51 Headache: Secondary | ICD-10-CM | POA: Diagnosis present

## 2019-06-28 DIAGNOSIS — I10 Essential (primary) hypertension: Secondary | ICD-10-CM | POA: Diagnosis not present

## 2019-06-28 MED ORDER — METOCLOPRAMIDE HCL 5 MG/ML IJ SOLN
10.0000 mg | Freq: Once | INTRAMUSCULAR | Status: AC
  Administered 2019-06-28: 10 mg via INTRAVENOUS
  Filled 2019-06-28: qty 2

## 2019-06-28 MED ORDER — DIPHENHYDRAMINE HCL 50 MG/ML IJ SOLN
25.0000 mg | Freq: Once | INTRAMUSCULAR | Status: AC
  Administered 2019-06-28: 25 mg via INTRAVENOUS
  Filled 2019-06-28: qty 1

## 2019-06-28 NOTE — ED Provider Notes (Signed)
[facility] EMERGENCY DEPARTMENT Provider Note   CSN: [id number] Arrival date & time: 06/28/19  7035     History   Chief Complaint Chief Complaint  Patient presents with  . Headache    HPI Sharon Fowler is a 48 y.o. female.     The history is provided by the patient.  Headache Pain location:  Frontal Quality:  Dull Onset quality:  Gradual Duration:  3 weeks Timing:  Intermittent Progression:  Worsening Chronicity:  New Context: bright light   Relieved by:  None tried Worsened by:  Nothing Associated symptoms: no abdominal pain, no blurred vision, no dizziness, no eye pain, no fever, no focal weakness, no loss of balance, no neck pain, no neck stiffness, no numbness, no syncope, no visual change, no vomiting and no weakness   Patient with history of hypertension presents with headache for the past 3 weeks.  She reports it has been intermittent.  Tonight while at work she reports the pain gradually worsened.  There is no sudden onset of pain.  No fevers or vomiting.  No focal weakness.  No visual changes. She reports recent changes in her blood pressure medicines and her blood pressure is still elevated Patient reports she can ambulate Past Medical History:  Diagnosis Date  . ANEMIA, IRON DEFICIENCY UNSPEC 06/11/2009   corrected with iron replacement---menstrual reelated  . GOITER, MULTINODULAR 07/15/2009  . HYPERLIPIDEMIA 03/25/2008  . HYPERTENSION 03/25/2008  . OBESITY 03/25/2008    Patient Active Problem List   Diagnosis Date Noted  . Gastric band erosion 08/20/2017  . Proteinuria 05/26/2017  . Non-intractable vomiting with nausea 05/26/2017  . Acute left-sided thoracic back pain 05/26/2017  . Flank pain 05/26/2017  . GOITER, MULTINODULAR 07/15/2009  . ANEMIA, IRON DEFICIENCY UNSPEC 06/11/2009  . HYPERLIPIDEMIA 03/25/2008  . OBESITY 03/25/2008  . Essential hypertension 03/25/2008    Past Surgical History:  Procedure Laterality Date  .  CESAREAN SECTION  7/91, 5/95   x2  . CHOLECYSTECTOMY  10/91   laparascopy  . ESOPHAGOGASTRODUODENOSCOPY (EGD) WITH PROPOFOL N/A 08/16/2017   Procedure: ESOPHAGOGASTRODUODENOSCOPY (EGD) WITH PROPOFOL;  Surgeon: Alphonsa Overall, MD;  Location: [facility] END[facility]COPY;  Service: General;  Laterality: N/A;  . LAPAROSCOPIC GASTRIC BANDING  2011  . TUBAL LIGATION     01/2003     OB History   No obstetric history on file.      Home Medications    Prior to Admission medications   Medication Sig Start Date End Date Taking? Authorizing Provider  atorvastatin (LIPITOR) 10 MG tablet Take 1 tablet (10 mg total) by mouth daily. 02/17/19   Billie Ruddy, MD  Biotin 5000 MCG CAPS Take by mouth daily.    [provider]  Kramm COHOSH PO Take by mouth.    [provider]  Cyanocobalamin (B-12 PO) Take 1 tablet by mouth daily.     [provider]  Docusate Calcium (STOOL SOFTENER PO) Stool Softener    [provider]  estradiol (ESTRACE) 0.1 MG/GM vaginal cream INSERT 1 G EVERY MONTH BY VAGINAL ROUTE 08/31/17   [provider]  IRON PO iron    [provider]  ketoconazole (NIZORAL) 2 % cream ketoconazole 2 % topical cream  APPLY TO THE AFFECTED AREA S  BY TOPICAL ROUTE TWICE DAILY    [provider]  losartan (COZAAR) 100 MG tablet Take 1 tablet (100 mg total) by mouth daily. 06/04/19   Billie Ruddy, MD  Multiple Vitamin (MULTIVITAMIN  WITH MINERALS) TABS tablet Take 1 tablet by mouth daily.    [provider]  oxybutynin (DITROPAN-XL) 10 MG 24 hr tablet Take by mouth 2 (two) times daily.    [provider]    Family History Family History  Problem Relation Age of Onset  . Diabetes Mother   . Stroke Mother   . Alcohol abuse Father   . Stroke Father     Social History Social History   Tobacco Use  . Smoking status: Former Smoker    Quit date: 01/02/1991    Years since quitting: 28.5  . Smokeless tobacco: Never Used   Substance Use Topics  . Alcohol use: Yes    Alcohol/week: 0.0 standard drinks    Comment: occ  . Drug use: No     Allergies   Chlorthalidone and Lisinopril   Review of Systems Review of Systems  Constitutional: Negative for fever.  Eyes: Negative for blurred vision, pain and visual disturbance.  Cardiovascular: Negative for syncope.  Gastrointestinal: Negative for abdominal pain and vomiting.  Musculoskeletal: Negative for neck pain and neck stiffness.  Neurological: Positive for headaches. Negative for dizziness, focal weakness, speech difficulty, weakness, numbness and loss of balance.  All other systems reviewed and are negative.    Physical Exam Updated Vital Signs BP (!) 167/99   Pulse 72   Temp 98 F (36.7 C) (Oral)   Resp 16   LMP 05/08/2011 (Approximate)   SpO2 99%   Physical Exam  CONSTITUTIONAL: Well developed/well nourished HEAD: Normocephalic/atraumatic EYES: EOMI/PERRL, no nystagmus, no ptosis, normal fundoscopic exam (no papilledema)  ENMT: Mucous membranes moist NECK: supple no meningeal signs, no bruits SPINE/BACK:entire spine nontender CV: S1/S2 noted, no murmurs/rubs/gallops noted LUNGS: Lungs are clear to auscultation bilaterally, no apparent distress ABDOMEN: soft, nontender, no rebound or guarding GU:no cva tenderness NEURO:Awake/alert, face symmetric, no arm or leg drift is noted Equal 5/5 strength with shoulder abduction, elbow flex/extension, wrist flex/extension in upper extremities and equal hand grips bilaterally Equal 5/5 strength with hip flexion,knee flex/extension, foot dorsi/plantar flexion Cranial nerves 3/4/5/6/05/07/09/11/12 tested and intact No past pointing Sensation to light touch intact in all extremities EXTREMITIES: pulses normal, full ROM SKIN: warm, color normal PSYCH: no abnormalities of mood noted, alert and oriented to situation   ED Treatments / Results  Labs (all labs ordered are listed, but only abnormal  results are displayed) Labs Reviewed - No data to display  EKG None  Radiology No results found.  Procedures Procedures    Medications Ordered in ED Medications  metoCLOPramide (REGLAN) injection 10 mg (10 mg Intravenous Given 06/28/19 0541)  diphenhydrAMINE (BENADRYL) injection 25 mg (25 mg Intravenous Given 06/28/19 0541)     Initial Impression / Assessment and Plan / ED Course  I have reviewed the triage vital signs and the nursing notes.          Patient presented with headache for the past 2 weeks.  She was well-appearing, no acute neuro deficit Blood pressure is improved. Low suspicion for acute neurologic emergency. No physical exam findings suggest pseudotumor. No signs of acute stroke. She is afebrile, no meningeal signs.  Plan to continue BP meds at home.  Follow with PCP in the next 2 weeks if headache persists that she may need further imaging.   Final Clinical Impressions(s) / ED Diagnoses   Final diagnoses:  Other headache syndrome    ED Discharge Orders    None       Ripley Fraise, MD 06/28/19 [phone]

## 2019-06-28 NOTE — Discharge Instructions (Addendum)
You are having a headache. No specific cause was found today for your headache. It may have been a migraine or other cause of headache. Stress, anxiety, fatigue, and depression are common triggers for headaches. Your headache today does not appear to be life-threatening or require hospitalization, but often the exact cause of headaches is not determined in the emergency department. Therefore, follow-up with your doctor is very important to find out what may have caused your headache, and whether or not you need any further diagnostic testing or treatment. Sometimes headaches can appear benign (not harmful), but then more serious symptoms can develop which should prompt an immediate re-evaluation by your doctor or the emergency department.  SEEK MEDICAL ATTENTION IF:  You develop possible problems with medications prescribed.  The medications don't resolve your headache, if it recurs , or if you have multiple episodes of vomiting or can't take fluids. You have a change from the usual headache.  RETURN IMMEDIATELY IF you develop a sudden, severe headache or confusion, become poorly responsive or faint, develop a fever above 100.64F or problem breathing, have a change in speech, vision, swallowing, or understanding, or develop new weakness, numbness, tingling, incoordination, or have a seizure.

## 2019-06-28 NOTE — ED Triage Notes (Signed)
Pt states that she has had a headache for the past few days, worse tonight, photosensitivity, pt states that her BP has been high the past few days neuro intact bilaterally.

## 2019-06-30 ENCOUNTER — Telehealth: Payer: Self-pay | Admitting: Family Medicine

## 2019-06-30 NOTE — Telephone Encounter (Signed)
Copied from [place] [id number]. Topic: General - Other >> Jun 30, 2019  8:42 AM Keene Breath wrote: Reason for CRM: Patient called  to request a referral for her visit to the ER on Saturday per her insurance company.  Please advise and call patient back at [phone]

## 2019-06-30 NOTE — Telephone Encounter (Signed)
Ok.  We don't typically refer ppl to the ED.

## 2019-06-30 NOTE — Telephone Encounter (Signed)
Pt states that she needs a referral stating that she went to the ED for elevated BP 160/130 due to her insurance billing, please advise if ok

## 2019-06-30 NOTE — Telephone Encounter (Signed)
See note

## 2019-07-02 ENCOUNTER — Other Ambulatory Visit (HOSPITAL_COMMUNITY): Payer: Self-pay | Admitting: Surgery

## 2019-07-04 NOTE — Telephone Encounter (Signed)
LVM for pt regarding her request for a referral to the ED

## 2019-07-10 ENCOUNTER — Other Ambulatory Visit (HOSPITAL_COMMUNITY): Payer: Self-pay | Admitting: Surgery

## 2019-07-10 ENCOUNTER — Ambulatory Visit (HOSPITAL_COMMUNITY)
Admission: RE | Admit: 2019-07-10 | Discharge: 2019-07-10 | Disposition: A | Discharge status: Home / Self Care | Payer: No Typology Code available for payment source | Source: Ambulatory Visit | Attending: Surgery | Admitting: Surgery

## 2019-07-10 ENCOUNTER — Other Ambulatory Visit: Payer: Self-pay

## 2019-07-10 DIAGNOSIS — Z6841 Body Mass Index (BMI) 40.0 and over, adult: Secondary | ICD-10-CM | POA: Insufficient documentation

## 2019-07-17 MED FILL — LOSARTAN POTASSIUM 100 MG T: 100 | 30 days supply | Qty: 30 | Fill #1

## 2019-07-17 MED FILL — OXYBUTYNIN 5 MG TABLET: 5 | 15 days supply | Qty: 30 | Fill #1

## 2019-07-22 MED FILL — ATORVASTATIN 10 MG TABLET: 10 | 90 days supply | Qty: 90 | Fill #1

## 2019-07-29 ENCOUNTER — Ambulatory Visit (INDEPENDENT_AMBULATORY_CARE_PROVIDER_SITE_OTHER): Payer: No Typology Code available for payment source | Admitting: Professional

## 2019-08-05 ENCOUNTER — Encounter: Payer: No Typology Code available for payment source | Attending: Surgery | Admitting: Dietician

## 2019-08-05 ENCOUNTER — Other Ambulatory Visit: Payer: Self-pay

## 2019-08-05 ENCOUNTER — Encounter: Payer: Self-pay | Admitting: Dietician

## 2019-08-05 DIAGNOSIS — E669 Obesity, unspecified: Secondary | ICD-10-CM | POA: Diagnosis not present

## 2019-08-05 NOTE — Progress Notes (Signed)
Bariatric Pre-Op Nutrition Assessment Medical Nutrition Therapy  Appt Start Time: 11:15am    End time: 12:15pm  Patient was seen on 08/05/2019 for Pre-Operative Nutrition Assessment. Assessment and letter of approval faxed to [facility] Bariatric Surgery Program coordinator on 08/05/2019.   Planned surgery: Sleeve Gastrectomy  Pt expectation of surgery: to lose weight and weigh less than 180 pounds Pt expectation of dietitian: none stated  Referral Stated Supervised Weight Loss (SWL) Visits Needed: 0 (Pt plans to return for a SWL visit next month for further nutrition education and preparation for surgery)    NUTRITION ASSESSMENT   Anthropometrics  Start weight at [facility]: 292 lbs (date: 08/05/2019) Height: 64 in BMI: 50 kg/m2    Clinical  Medical hx: obesity, HTN, hypercholesterolemia, urinary incontinence, prediabetes, lap band (2011) and lap band removal (2018) Medications: atorvastatin, losartan, cephalexin    Lifestyle & Dietary Hx Pt lives with her significant other. Has 5 children. 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 States she is not "in a hurry" to have surgery.   States she often goes 24 hours without eating. Eats out often (states ~10 meals/week) and will skip all other meals to "compensate for the fast food I ate." Avoids pork and shellfish. Tries to avoid pasta and rice and focus more on salads and vegetables recently. Will have bagel with cream cheese if she eats breakfast.   Does not drink much fluid, pt estimates about 32 oz per day. States it is hard to get adequate fluid intake because she doesn't experience thirst. Additionally, tries to avoid drinking because she has urinary incontinence. Beverages include water, juice, and soda.   Estimated Energy Needs Calories: 1600 Carbohydrate: 180g Protein: 100g Fat: 53g   NUTRITION DIAGNOSIS  Overweight/obesity ([place]-3.3) related to past poor dietary habits and physical inactivity as evidenced by  patient w/ planned Sleeve Gastrectomy surgery following dietary guidelines for continued weight loss.    NUTRITION INTERVENTION  Nutrition counseling (C-1) and education (E-2) to facilitate bariatric surgery goals.  Pre-Op Goals Reviewed with the Patient . Track food and beverage intake (pen and paper, MyFitness Pal, Baritastic app, etc.) . Make healthy food choices while monitoring portion sizes . Consume 3 meals per day or try to eat every 3-5 hours . Avoid concentrated sugars and fried foods . Keep sugar & fat in the single digits per serving on food labels . Practice CHEWING your food (aim for applesauce consistency) . Practice not drinking 15 minutes before, during, and 30 minutes after each meal and snack . Avoid all carbonated beverages (ex: soda, sparkling beverages)  . Limit caffeinated beverages (ex: coffee, tea, energy drinks) . Avoid all sugar-sweetened beverages (ex: regular soda, sports drinks)  . Avoid alcohol  . Aim for 64-100 ounces of FLUID daily (with at least half of fluid intake being plain water)  . Aim for at least 60-80 grams of PROTEIN daily . Look for a liquid protein source that contains ?15 g protein and ?5 g carbohydrate (ex: shakes, drinks, shots) . Make a list of non-food related activities . Physical activity is an important part of a healthy lifestyle so keep it moving! The goal is to reach 150 minutes of exercise per week, including cardiovascular and weight baring activity.  Handouts Provided Include  . Bariatric Surgery handouts (Nutrition Visits, Pre-Op Goals, Protein Shakes, Vitamins & Minerals) . Meal Ideas   Learning Style & Readiness for Change Teaching method utilized: Visual & Auditory  Demonstrated degree of understanding via: Teach  Back  Barriers to learning/adherence to lifestyle change: Contemplative Stage of Change   RD's Notes for Next Visit . Fluid intake  . Food hx and meal pattern/frequency  . Reinforce balanced  meals/snacks     MONITORING & EVALUATION Dietary intake, weekly physical activity, body weight, and pre-op goals reached at next nutrition visit.   Next Steps Patient is to return to [facility] in 1 month for a SWL Visit.  Patient is to call [facility] to enroll in Pre-Op Class (>2 weeks before surgery) and Post-Op Class (2 weeks after surgery) for further nutrition education when surgery date is scheduled.

## 2019-08-14 ENCOUNTER — Ambulatory Visit: Payer: No Typology Code available for payment source | Admitting: Professional

## 2019-08-15 ENCOUNTER — Ambulatory Visit (INDEPENDENT_AMBULATORY_CARE_PROVIDER_SITE_OTHER): Payer: No Typology Code available for payment source | Admitting: Professional

## 2019-08-25 MED FILL — OXYBUTYNIN 5 MG TABLET: 5 | 15 days supply | Qty: 30 | Fill #2

## 2019-08-29 ENCOUNTER — Telehealth: Payer: Self-pay

## 2019-08-29 NOTE — Telephone Encounter (Signed)
   Noted  Copied from [place] [phone]. Topic: General - Other >> Aug 07, 2019  4:41 PM Reyne Dumas L wrote: Reason for CRM:  Pt wants to let Dr. Volanda Napoleon know that there OBGYN gave her a referral to a Physical Therapist to do pelvic floor exercises.

## 2019-09-04 ENCOUNTER — Other Ambulatory Visit: Payer: Self-pay

## 2019-09-04 ENCOUNTER — Encounter: Payer: Self-pay | Admitting: Dietician

## 2019-09-04 ENCOUNTER — Encounter: Payer: No Typology Code available for payment source | Attending: Surgery | Admitting: Dietician

## 2019-09-04 DIAGNOSIS — E669 Obesity, unspecified: Secondary | ICD-10-CM | POA: Diagnosis present

## 2019-09-04 NOTE — Progress Notes (Signed)
Supervised Weight Loss Visit Bariatric Nutrition Education Appt Start Time: 7:30am     End Time: 7:50am  Planned Surgery: Sleeve Gastrectomy   Pt Expectation of Surgery/ Goals: to weigh less than 180 lbs   1st SWL Appointment (Pt does not have any insurance-required SWL visits, pt elected to continue coming in for further nutrition education in preparation for surgery)     NUTRITION ASSESSMENT  Anthropometrics  Start weight at [facility]: 292 lbs (date: 08/05/2019) Today's weight: 286 lbs Weight change: - 6 lbs (since previous visit on 08/05/2019) BMI: 49 kg/m2    Clinical  Medical Hx: obesity, HTN, hypercholesterolemia, urinary incontinence, prediabetes, lap band (2011) and lap band removal (2018)  Lifestyle & Dietary Hx Pt lives with her significant other. Has 5 children. 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 States she is not "in a hurry" to have surgery.  Eating pattern depends on if she works that day. States it is not uncommon for her to go 24+ hours without eating. States she recently realized she struggles with binge eating. States she realized she does not know what hunger feels like, and this is one of the reasons why she may only eat once per day if at all. States that, if she feels stressed, she will not eat (often for 2-3 days at a time.) If eats a meal, it may be "regular" serving or a large meal. States she has urinary incontinence, which discourages her from drinking water, however she states she knows fluid intake is important and she wants to try and drink more water anyways.   Estimated daily fluid intake: 32 oz  24-Hr Dietary Recall First Meal: fast food breakfast (biscuit)  Snack: - Second Meal: - Snack: - Third Meal: - Snack: - Beverages: water  Estimated Energy Needs Calories: 1600 Carbohydrate: 180g Protein: 100g Fat: 53g   NUTRITION DIAGNOSIS  Overweight/obesity ([place]-3.3) related to past poor dietary habits and physical inactivity as evidenced  by patient w/ planned Sleeve Gastrectomy surgery following dietary guidelines for continued weight loss.   NUTRITION INTERVENTION  Nutrition counseling (C-1) and education (E-2) to facilitate bariatric surgery goals.  Pre-Op Goals Progress & New Goals . Still not eating frequent meals/snacks (only eating <1 time/day)  . Not meeting fluid goal, but working on increasing  . NEW: Look for protein shakes/supplements to try to use as a meal/snack to help get more food in during the day   Handouts Provided Include   Should I Eat?   Learning Style & Readiness for Change Teaching method utilized: Visual & Auditory  Demonstrated degree of understanding via: Teach Back  Barriers to learning/adherence to lifestyle change: Emotional Relationship with Food  RD's Notes for next Visit  . Check meal pattern/frequency and see if pt identified ways to incorporate more food and fluids throughout the day  . Offer snack ideas    MONITORING & EVALUATION Dietary intake, weekly physical activity, body weight, and pre-op goals in 1 month.   Next Steps  Patient is to return to [facility] in 1 month for supervised weight loss and continued nutrition preparation for surgery.

## 2019-09-18 MED FILL — OXYBUTYNIN 5 MG TABLET: 5 | 30 days supply | Qty: 120 | Fill #0

## 2019-10-03 ENCOUNTER — Ambulatory Visit: Payer: No Typology Code available for payment source | Admitting: Dietician

## 2019-10-07 MED FILL — OXYBUTYNIN 5 MG TABLET: 5 | 30 days supply | Qty: 120 | Fill #0

## 2019-10-27 MED FILL — LOSARTAN POTASSIUM 100 MG T: 100 | 30 days supply | Qty: 30 | Fill #2

## 2019-10-27 MED FILL — ATORVASTATIN 10 MG TABLET: 10 | 90 days supply | Qty: 90 | Fill #2

## 2019-12-02 MED FILL — LOSARTAN POTASSIUM 100 MG T: 100 | 30 days supply | Qty: 30 | Fill #3

## 2020-01-23 MED FILL — LOSARTAN POTASSIUM 100 MG T: 100 | 30 days supply | Qty: 30 | Fill #4

## 2020-03-12 ENCOUNTER — Other Ambulatory Visit: Payer: Self-pay | Admitting: Family Medicine

## 2020-03-12 DIAGNOSIS — E785 Hyperlipidemia, unspecified: Secondary | ICD-10-CM

## 2020-03-12 DIAGNOSIS — I10 Essential (primary) hypertension: Secondary | ICD-10-CM

## 2020-03-15 ENCOUNTER — Other Ambulatory Visit: Payer: Self-pay | Admitting: Family Medicine

## 2020-03-15 MED FILL — LOSARTAN POTASSIUM 100 MG T: 100 | 30 days supply | Qty: 30 | Fill #0

## 2020-03-15 MED FILL — ATORVASTATIN 10 MG TABLET: 10 | 90 days supply | Qty: 90 | Fill #0

## 2020-04-14 ENCOUNTER — Other Ambulatory Visit: Payer: Self-pay

## 2020-04-15 ENCOUNTER — Ambulatory Visit (INDEPENDENT_AMBULATORY_CARE_PROVIDER_SITE_OTHER): Payer: No Typology Code available for payment source | Admitting: Family Medicine

## 2020-04-15 ENCOUNTER — Encounter: Payer: Self-pay | Admitting: Family Medicine

## 2020-04-15 VITALS — BP 136/94 | HR 76 | Temp 97.9°F | Wt 287.0 lb

## 2020-04-15 DIAGNOSIS — K219 Gastro-esophageal reflux disease without esophagitis: Secondary | ICD-10-CM

## 2020-04-15 DIAGNOSIS — I10 Essential (primary) hypertension: Secondary | ICD-10-CM | POA: Diagnosis not present

## 2020-04-15 DIAGNOSIS — E782 Mixed hyperlipidemia: Secondary | ICD-10-CM | POA: Diagnosis not present

## 2020-04-15 DIAGNOSIS — E049 Nontoxic goiter, unspecified: Secondary | ICD-10-CM | POA: Diagnosis not present

## 2020-04-15 DIAGNOSIS — Z0000 Encounter for general adult medical examination without abnormal findings: Secondary | ICD-10-CM

## 2020-04-15 DIAGNOSIS — R1012 Left upper quadrant pain: Secondary | ICD-10-CM

## 2020-04-15 DIAGNOSIS — Z6841 Body Mass Index (BMI) 40.0 and over, adult: Secondary | ICD-10-CM

## 2020-04-15 NOTE — Progress Notes (Signed)
Subjective:     Sharon Fowler is a 49 y.o. female and is here for a comprehensive physical exam. The patient reports problems - intermittent heart burn.  Patient notes increased heartburn every few months.  Pt endorses laying down immediately after eating dinner.  No correlation with specific foods.  Pt notes increased stress at work.  Pt has been doing more travel.  Has several trips planned including [place], [place] and the beach.  Pt has not been checking bp.  Notes it was elevated at the Dentist office, but likely d/t her being nervous and them using a wrist cuff.  Pt taking losartan 100 mg daily, Lipitor 10 mg, oxybutynin.  Pt has difficulty swallowing large items 2/2 goiter, but it is about the same in size.  Social History   Socioeconomic History  . Marital status: Legally Separated    Spouse name: Not on file  . Number of children: Not on file  . Years of education: Not on file  . Highest education level: Not on file  Occupational History  . Not on file  Tobacco Use  . Smoking status: Former Smoker    Quit date: 01/02/1991    Years since quitting: 29.3  . Smokeless tobacco: Never Used  Vaping Use  . Vaping Use: Never used  Substance and Sexual Activity  . Alcohol use: Yes    Alcohol/week: 0.0 standard drinks    Comment: occ  . Drug use: No  . Sexual activity: Not Currently  Other Topics Concern  . 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    She likes to sleep when she is not at work.    Social Determinants of Health   Financial Resource Strain:   . Difficulty of Paying Living Expenses:   Food Insecurity:   . Worried About Charity fundraiser in the Last Year:   . Arboriculturist in the Last Year:   Transportation Needs:   . Film/video editor (Medical):   Marland Kitchen Lack of Transportation (Non-Medical):   Physical Activity:   . Days of Exercise per Week:   . Minutes of  Exercise per Session:   Stress:   . Feeling of Stress :   Social Connections:   . Frequency of Communication with Friends and Family:   . Frequency of Social Gatherings with Friends and Family:   . Attends Religious Services:   . Active Member of Clubs or Organizations:   . Attends Archivist Meetings:   Marland Kitchen Marital Status:   Intimate Partner Violence:   . Fear of Current or Ex-Partner:   . Emotionally Abused:   Marland Kitchen Physically Abused:   . Sexually Abused:    Health Maintenance  Topic Date Due  . Hepatitis C Screening  Never done  . COVID-19 Vaccine (1) Never done  . HIV Screening  Never done  . INFLUENZA VACCINE  05/30/2020  . MAMMOGRAM  09/28/2020  . PAP SMEAR-Modifier  09/28/2020  . TETANUS/TDAP  02/07/2024    The following portions of the patient's history were reviewed and updated as appropriate: allergies, current medications, past family history, past medical history, past social history, past surgical history and problem list.  Review of Systems Pertinent items noted in HPI and remainder of comprehensive ROS otherwise negative.   Objective:    BP (!) 136/94 (BP Location: Left  Arm, Patient Position: Sitting, Cuff Size: Large) Comment: Recheck after 15  minutes Comment (Cuff Size): Thigh High Cuff  Pulse 76   Temp 97.9 F (36.6 C) (Temporal)   Wt 287 lb (130.2 kg)   LMP 05/08/2011 (Approximate)   SpO2 98%   BMI 49.26 kg/m  General appearance: alert, cooperative and no distress Head: Normocephalic, without obvious abnormality, atraumatic Eyes: conjunctivae/corneas clear. PERRL, EOM's intact. Fundi benign. Ears: normal TM's and external ear canals both ears Nose: Nares normal. Septum midline. Mucosa normal. No drainage or sinus tenderness. Throat: lips, mucosa, and tongue normal; teeth and gums normal Neck: no adenopathy, no carotid bruit, no JVD, supple, symmetrical, trachea midline and thyroid enlarged, R lobe>L, small nodule noted left of midline. Lungs:  clear to auscultation bilaterally Heart: regular rate and rhythm, S1, S2 normal, no murmur, click, rub or gallop Abdomen: soft, BS present, ND, TTP in LUQ. no hepatosplenomegaly Extremities: extremities normal, atraumatic, no cyanosis or edema Pulses: 2+ and symmetric Skin: Skin color, texture, turgor normal. No rashes or lesions Lymph nodes: Cervical, supraclavicular, and axillary nodes normal. Neurologic: Alert and oriented X 3, normal strength and tone. Normal symmetric reflexes. Normal coordination and gait    Assessment:    Healthy female exam with goiter and LUQ pain.     Plan:     Anticipatory guidance given including wearing seatbelts, smoke detectors in the home, increasing physical activity, increasing p.o. intake of water and vegetables. -PHQ-9 score 3 -GAD-7 score 0 -will obtain labs when pt is fasting -Mammogram up-to-date -Pap up-to-date.  Done November 2020.  Due November 2020 -Given handout -Next CPE in 1 year See After Visit Summary for Counseling Recommendations    Gastroesophageal reflux disease, unspecified whether esophagitis present  -Discussed avoiding laying down at least 2 hours after eating and other ways to help reduce symptoms -Patient given handout -Consider trial of PPI for continued symptoms. - Plan: Comprehensive metabolic panel  Essential hypertension  - Plan: Comprehensive metabolic panel  Mixed hyperlipidemia  - Plan: Lipid panel  Goiter  - Plan: TSH, T4, Free, u/s  LUQ pain  - Plan: Hemoglobin A1c, Comprehensive metabolic panel, Lipase  Class 3 severe obesity due to excess calories with serious comorbidity and body mass index (BMI) of 45.0 to 49.9 in adult [facility]) -discussed lifestyle modifications -continue daily MVI 2/2 h/o gastric bypass  F/u in 1 month for HTN  Grier Mitts, MD

## 2020-04-15 NOTE — Patient Instructions (Signed)
Preventive Care 48-48 Years Old, Female Preventive care refers to visits with your health care provider and lifestyle choices that can promote health and wellness. This includes:  A yearly physical exam. This may also be called an annual well check.  Regular dental visits and eye exams.  Immunizations.  Screening for certain conditions.  Healthy lifestyle choices, such as eating a healthy diet, getting regular exercise, not using drugs or products that contain nicotine and tobacco, and limiting alcohol use. What can I expect for my preventive care visit? Physical exam Your health care provider will check your:  Height and weight. This may be used to calculate body mass index (BMI), which tells if you are at a healthy weight.  Heart rate and blood pressure.  Skin for abnormal spots. Counseling Your health care provider may ask you questions about your:  Alcohol, tobacco, and drug use.  Emotional well-being.  Home and relationship well-being.  Sexual activity.  Eating habits.  Work and work environment.  Method of birth control.  Menstrual cycle.  Pregnancy history. What immunizations do I need?  Influenza (flu) vaccine  This is recommended every year. Tetanus, diphtheria, and pertussis (Tdap) vaccine  You may need a Td booster every 10 years. Varicella (chickenpox) vaccine  You may need this if you have not been vaccinated. Zoster (shingles) vaccine  You may need this after age 60. Measles, mumps, and rubella (MMR) vaccine  You may need at least one dose of MMR if you were born in 1957 or later. You may also need a second dose. Pneumococcal conjugate (PCV13) vaccine  You may need this if you have certain conditions and were not previously vaccinated. Pneumococcal polysaccharide (PPSV23) vaccine  You may need one or two doses if you smoke cigarettes or if you have certain conditions. Meningococcal conjugate (MenACWY) vaccine  You may need this if you  have certain conditions. Hepatitis A vaccine  You may need this if you have certain conditions or if you travel or work in places where you may be exposed to hepatitis A. Hepatitis B vaccine  You may need this if you have certain conditions or if you travel or work in places where you may be exposed to hepatitis B. Haemophilus influenzae type b (Hib) vaccine  You may need this if you have certain conditions. Human papillomavirus (HPV) vaccine  If recommended by your health care provider, you may need three doses over 6 months. You may receive vaccines as individual doses or as more than one vaccine together in one shot (combination vaccines). Talk with your health care provider about the risks and benefits of combination vaccines. What tests do I need? Blood tests  Lipid and cholesterol levels. These may be checked every 5 years, or more frequently if you are over 48 years old.  Hepatitis C test.  Hepatitis B test. Screening  Lung cancer screening. You may have this screening every year starting at age 48 if you have a 30-pack-year history of smoking and currently smoke or have quit within the past 15 years.  Colorectal cancer screening. All adults should have this screening starting at age 48 and continuing until age 48. Your health care provider may recommend screening at age 45 if you are at increased risk. You will have tests every 1-10 years, depending on your results and the type of screening test.  Diabetes screening. This is done by checking your blood sugar (glucose) after you have not eaten for a while (fasting). You may have this   done every 1-3 years.  Mammogram. This may be done every 1-2 years. Talk with your health care provider about when you should start having regular mammograms. This may depend on whether you have a family history of breast cancer.  BRCA-related cancer screening. This may be done if you have a family history of breast, ovarian, tubal, or peritoneal  cancers.  Pelvic exam and Pap test. This may be done every 3 years starting at age 48. Starting at age 48, this may be done every 5 years if you have a Pap test in combination with an HPV test. Other tests  Sexually transmitted disease (STD) testing.  Bone density scan. This is done to screen for osteoporosis. You may have this scan if you are at high risk for osteoporosis. Follow these instructions at home: Eating and drinking  Eat a diet that includes fresh fruits and vegetables, whole grains, lean protein, and low-fat dairy.  Take vitamin and mineral supplements as recommended by your health care provider.  Do not drink alcohol if: ? Your health care provider tells you not to drink. ? You are pregnant, may be pregnant, or are planning to become pregnant.  If you drink alcohol: ? Limit how much you have to 0-1 drink a day. ? Be aware of how much alcohol is in your drink. In the [place] one drink equals one 12 oz bottle of beer (355 mL), one 5 oz glass of wine (148 mL), or one 1 oz glass of hard liquor (44 mL). Lifestyle  Take daily care of your teeth and gums.  Stay active. Exercise for at least 30 minutes on 5 or more days each week.  Do not use any products that contain nicotine or tobacco, such as cigarettes, e-cigarettes, and chewing tobacco. If you need help quitting, ask your health care provider.  If you are sexually active, practice safe sex. Use a condom or other form of birth control (contraception) in order to prevent pregnancy and STIs (sexually transmitted infections).  If told by your health care provider, take low-dose aspirin daily starting at age 9. What's next?  Visit your health care provider once a year for a well check visit.  Ask your health care provider how often you should have your eyes and teeth checked.  Stay up to date on all vaccines. This information is not intended to replace advice given to you by your health care provider. Make sure you  discuss any questions you have with your health care provider. Document Revised: 06/27/2018 Document Reviewed: 06/27/2018 Elsevier Patient Education  2020 [place] for Gastroesophageal Reflux Disease, Adult When you have gastroesophageal reflux disease (GERD), the foods you eat and your eating habits are very important. Choosing the right foods can help ease the discomfort of GERD. Consider working with a diet and nutrition specialist (dietitian) to help you make healthy food choices. What general guidelines should I follow?  Eating plan  Choose healthy foods low in fat, such as fruits, vegetables, whole grains, low-fat dairy products, and lean meat, fish, and poultry.  Eat frequent, small meals instead of three large meals each day. Eat your meals slowly, in a relaxed setting. Avoid bending over or lying down until 2-3 hours after eating.  Limit high-fat foods such as fatty meats or fried foods.  Limit your intake of oils, butter, and shortening to less than 8 teaspoons each day.  Avoid the following: ? Foods that cause symptoms. These may be different for different people.  Keep a food diary to keep track of foods that cause symptoms. ? Alcohol. ? Drinking large amounts of liquid with meals. ? Eating meals during the 2-3 hours before bed.  Cook foods using methods other than frying. This may include baking, grilling, or broiling. Lifestyle  Maintain a healthy weight. Ask your health care provider what weight is healthy for you. If you need to lose weight, work with your health care provider to do so safely.  Exercise for at least 30 minutes on 5 or more days each week, or as told by your health care provider.  Avoid wearing clothes that fit tightly around your waist and chest.  Do not use any products that contain nicotine or tobacco, such as cigarettes and e-cigarettes. If you need help quitting, ask your health care provider.  Sleep with the head of your bed  raised. Use a wedge under the mattress or blocks under the bed frame to raise the head of the bed. What foods are not recommended? The items listed may not be a complete list. Talk with your dietitian about what dietary choices are best for you. Grains Pastries or quick breads with added fat. [place] toast. Vegetables Deep fried vegetables. [place] fries. Any vegetables prepared with added fat. Any vegetables that cause symptoms. For some people this may include tomatoes and tomato products, chili peppers, onions and garlic, and horseradish. Fruits Any fruits prepared with added fat. Any fruits that cause symptoms. For some people this may include citrus fruits, such as oranges, grapefruit, pineapple, and lemons. Meats and other protein foods High-fat meats, such as fatty beef or pork, hot dogs, ribs, ham, sausage, salami and bacon. Fried meat or protein, including fried fish and fried chicken. Nuts and nut butters. Dairy Whole milk and chocolate milk. Sour cream. Cream. Ice cream. Cream cheese. Milk shakes. Beverages Coffee and tea, with or without caffeine. Carbonated beverages. Sodas. Energy drinks. Fruit juice made with acidic fruits (such as orange or grapefruit). Tomato juice. Alcoholic drinks. Fats and oils Butter. Margarine. Shortening. Ghee. Sweets and desserts Chocolate and cocoa. Donuts. Seasoning and other foods Pepper. Peppermint and spearmint. Any condiments, herbs, or seasonings that cause symptoms. For some people, this may include curry, hot sauce, or vinegar-based salad dressings. Summary  When you have gastroesophageal reflux disease (GERD), food and lifestyle choices are very important to help ease the discomfort of GERD.  Eat frequent, small meals instead of three large meals each day. Eat your meals slowly, in a relaxed setting. Avoid bending over or lying down until 2-3 hours after eating.  Limit high-fat foods such as fatty meat or fried foods. This information is  not intended to replace advice given to you by your health care provider. Make sure you discuss any questions you have with your health care provider. Document Revised: 02/06/2019 Document Reviewed: 10/17/2016 Elsevier Patient Education  [place] Your Hypertension Hypertension is commonly called high blood pressure. This is when the force of your blood pressing against the walls of your arteries is too strong. Arteries are blood vessels that carry blood from your heart throughout your body. Hypertension forces the heart to work harder to pump blood, and may cause the arteries to become narrow or stiff. Having untreated or uncontrolled hypertension can cause heart attack, stroke, kidney disease, and other problems. What are blood pressure readings? A blood pressure reading consists of a higher number over a lower number. Ideally, your blood pressure should be below 120/80. The first ("top")  number is called the systolic pressure. It is a measure of the pressure in your arteries as your heart beats. The second ("bottom") number is called the diastolic pressure. It is a measure of the pressure in your arteries as the heart relaxes. What does my blood pressure reading mean? Blood pressure is classified into four stages. Based on your blood pressure reading, your health care provider may use the following stages to determine what type of treatment you need, if any. Systolic pressure and diastolic pressure are measured in a unit called mm Hg. Normal  Systolic pressure: below 093.  Diastolic pressure: below 80. Elevated  Systolic pressure: 235-573.  Diastolic pressure: below 80. Hypertension stage 1  Systolic pressure: 220-254.  Diastolic pressure: 27-06. Hypertension stage 2  Systolic pressure: 237 or above.  Diastolic pressure: 90 or above. What health risks are associated with hypertension? Managing your hypertension is an important responsibility. Uncontrolled  hypertension can lead to:  A heart attack.  A stroke.  A weakened blood vessel (aneurysm).  Heart failure.  Kidney damage.  Eye damage.  Metabolic syndrome.  Memory and concentration problems. What changes can I make to manage my hypertension? Hypertension can be managed by making lifestyle changes and possibly by taking medicines. Your health care provider will help you make a plan to bring your blood pressure within a normal range. Eating and drinking   Eat a diet that is high in fiber and potassium, and low in salt (sodium), added sugar, and fat. An example eating plan is called the DASH (Dietary Approaches to Stop Hypertension) diet. To eat this way: ? Eat plenty of fresh fruits and vegetables. Try to fill half of your plate at each meal with fruits and vegetables. ? Eat whole grains, such as whole wheat pasta, brown rice, or whole grain bread. Fill about one quarter of your plate with whole grains. ? Eat low-fat diary products. ? Avoid fatty cuts of meat, processed or cured meats, and poultry with skin. Fill about one quarter of your plate with lean proteins such as fish, chicken without skin, beans, eggs, and tofu. ? Avoid premade and processed foods. These tend to be higher in sodium, added sugar, and fat.  Reduce your daily sodium intake. Most people with hypertension should eat less than 1,500 mg of sodium a day.  Limit alcohol intake to no more than 1 drink a day for nonpregnant women and 2 drinks a day for men. One drink equals 12 oz of beer, 5 oz of wine, or 1 oz of hard liquor. Lifestyle  Work with your health care provider to maintain a healthy body weight, or to lose weight. Ask what an ideal weight is for you.  Get at least 30 minutes of exercise that causes your heart to beat faster (aerobic exercise) most days of the week. Activities may include walking, swimming, or biking.  Include exercise to strengthen your muscles (resistance exercise), such as weight  lifting, as part of your weekly exercise routine. Try to do these types of exercises for 30 minutes at least 3 days a week.  Do not use any products that contain nicotine or tobacco, such as cigarettes and e-cigarettes. If you need help quitting, ask your health care provider.  Control any long-term (chronic) conditions you have, such as high cholesterol or diabetes. Monitoring  Monitor your blood pressure at home as told by your health care provider. Your personal target blood pressure may vary depending on your medical conditions, your age, and other  factors.  Have your blood pressure checked regularly, as often as told by your health care provider. Working with your health care provider  Review all the medicines you take with your health care provider because there may be side effects or interactions.  Talk with your health care provider about your diet, exercise habits, and other lifestyle factors that may be contributing to hypertension.  Visit your health care provider regularly. Your health care provider can help you create and adjust your plan for managing hypertension. Will I need medicine to control my blood pressure? Your health care provider may prescribe medicine if lifestyle changes are not enough to get your blood pressure under control, and if:  Your systolic blood pressure is 130 or higher.  Your diastolic blood pressure is 80 or higher. Take medicines only as told by your health care provider. Follow the directions carefully. Blood pressure medicines must be taken as prescribed. The medicine does not work as well when you skip doses. Skipping doses also puts you at risk for problems. Contact a health care provider if:  You think you are having a reaction to medicines you have taken.  You have repeated (recurrent) headaches.  You feel dizzy.  You have swelling in your ankles.  You have trouble with your vision. Get help right away if:  You develop a severe  headache or confusion.  You have unusual weakness or numbness, or you feel faint.  You have severe pain in your chest or abdomen.  You vomit repeatedly.  You have trouble breathing. Summary  Hypertension is when the force of blood pumping through your arteries is too strong. If this condition is not controlled, it may put you at risk for serious complications.  Your personal target blood pressure may vary depending on your medical conditions, your age, and other factors. For most people, a normal blood pressure is less than 120/80.  Hypertension is managed by lifestyle changes, medicines, or both. Lifestyle changes include weight loss, eating a healthy, low-sodium diet, exercising more, and limiting alcohol. This information is not intended to replace advice given to you by your health care provider. Make sure you discuss any questions you have with your health care provider. Document Revised: 02/07/2019 Document Reviewed: 09/13/2016 Elsevier Patient Education  [place].

## 2020-04-16 ENCOUNTER — Other Ambulatory Visit: Payer: Self-pay

## 2020-04-19 ENCOUNTER — Other Ambulatory Visit: Payer: Self-pay

## 2020-04-19 ENCOUNTER — Other Ambulatory Visit (INDEPENDENT_AMBULATORY_CARE_PROVIDER_SITE_OTHER): Payer: No Typology Code available for payment source

## 2020-04-19 DIAGNOSIS — K219 Gastro-esophageal reflux disease without esophagitis: Secondary | ICD-10-CM

## 2020-04-19 DIAGNOSIS — R1012 Left upper quadrant pain: Secondary | ICD-10-CM | POA: Diagnosis not present

## 2020-04-19 DIAGNOSIS — E782 Mixed hyperlipidemia: Secondary | ICD-10-CM | POA: Diagnosis not present

## 2020-04-19 DIAGNOSIS — I10 Essential (primary) hypertension: Secondary | ICD-10-CM

## 2020-04-19 DIAGNOSIS — Z0000 Encounter for general adult medical examination without abnormal findings: Secondary | ICD-10-CM

## 2020-04-19 DIAGNOSIS — E049 Nontoxic goiter, unspecified: Secondary | ICD-10-CM

## 2020-04-19 LAB — COMPREHENSIVE METABOLIC PANEL
ALT: 12 U/L (ref 0–35)
AST: 14 U/L (ref 0–37)
Albumin: 3.8 g/dL (ref 3.5–5.2)
Alkaline Phosphatase: 108 U/L (ref 39–117)
BUN: 12 mg/dL (ref 6–23)
CO2: 28 mEq/L (ref 19–32)
Calcium: 9.1 mg/dL (ref 8.4–10.5)
Chloride: 104 mEq/L (ref 96–112)
Creatinine, Ser: 0.61 mg/dL (ref 0.40–1.20)
GFR: 126.92 mL/min (ref >60.00)
Glucose, Bld: 93 mg/dL (ref 70–99)
Potassium: 4.2 mEq/L (ref 3.5–5.1)
Sodium: 138 mEq/L (ref 135–145)
Total Bilirubin: 0.3 mg/dL (ref 0.2–1.2)
Total Protein: 6.6 g/dL (ref 6.0–8.3)

## 2020-04-19 LAB — CBC WITH DIFFERENTIAL/PLATELET
Basophils Absolute: 0.1 10*3/uL (ref 0.0–0.1)
Basophils Relative: 0.9 % (ref 0.0–3.0)
Eosinophils Absolute: 0.1 10*3/uL (ref 0.0–0.7)
Eosinophils Relative: 2.1 % (ref 0.0–5.0)
HCT: 40 % (ref 36.0–46.0)
Hemoglobin: 13.2 g/dL (ref 12.0–15.0)
Lymphocytes Relative: 33 % (ref 12.0–46.0)
Lymphs Abs: 2.1 10*3/uL (ref 0.7–4.0)
MCHC: 33.1 g/dL (ref 30.0–36.0)
MCV: 82.9 fl (ref 78.0–100.0)
Monocytes Absolute: 0.4 10*3/uL (ref 0.1–1.0)
Monocytes Relative: 5.6 % (ref 3.0–12.0)
Neutro Abs: 3.7 10*3/uL (ref 1.4–7.7)
Neutrophils Relative %: 58.4 % (ref 43.0–77.0)
Platelets: 292 10*3/uL (ref 150.0–400.0)
RBC: 4.83 Mil/uL (ref 3.87–5.11)
RDW: 15 % (ref 11.5–15.5)
WBC: 6.4 10*3/uL (ref 4.0–10.5)

## 2020-04-19 LAB — LIPID PANEL
Cholesterol: 182 mg/dL (ref 0–200)
HDL: 45.5 mg/dL (ref >39.00)
LDL Cholesterol: 121 mg/dL — ABNORMAL HIGH (ref 0–99)
NonHDL: 136.58
Total CHOL/HDL Ratio: 4
Triglycerides: 76 mg/dL (ref 0.0–149.0)
VLDL: 15.2 mg/dL (ref 0.0–40.0)

## 2020-04-19 LAB — HEMOGLOBIN A1C: Hgb A1c MFr Bld: 5.7 % (ref 4.6–6.5)

## 2020-04-19 LAB — T4, FREE: Free T4: 0.94 ng/dL (ref 0.60–1.60)

## 2020-04-19 LAB — TSH: TSH: 0.67 u[IU]/mL (ref 0.35–4.50)

## 2020-04-19 LAB — LIPASE: Lipase: 7 U/L — ABNORMAL LOW (ref 11.0–59.0)

## 2020-04-28 ENCOUNTER — Ambulatory Visit
Admission: RE | Admit: 2020-04-28 | Discharge: 2020-04-28 | Disposition: A | Discharge status: Home / Self Care | Payer: No Typology Code available for payment source | Source: Ambulatory Visit | Attending: Family Medicine | Admitting: Family Medicine

## 2020-04-28 DIAGNOSIS — E049 Nontoxic goiter, unspecified: Secondary | ICD-10-CM

## 2020-04-30 MED FILL — LOSARTAN POTASSIUM 100 MG T: 100 | 30 days supply | Qty: 30 | Fill #1

## 2020-05-04 ENCOUNTER — Other Ambulatory Visit: Payer: Self-pay

## 2020-05-04 DIAGNOSIS — E041 Nontoxic single thyroid nodule: Secondary | ICD-10-CM

## 2020-05-14 ENCOUNTER — Encounter: Payer: Self-pay | Admitting: Family Medicine

## 2020-05-14 ENCOUNTER — Ambulatory Visit (INDEPENDENT_AMBULATORY_CARE_PROVIDER_SITE_OTHER): Payer: No Typology Code available for payment source | Admitting: Family Medicine

## 2020-05-14 ENCOUNTER — Other Ambulatory Visit: Payer: Self-pay

## 2020-05-14 VITALS — BP 158/72 | HR 70 | Temp 98.2°F | Ht 64.0 in | Wt 289.0 lb

## 2020-05-14 DIAGNOSIS — I10 Essential (primary) hypertension: Secondary | ICD-10-CM

## 2020-05-14 DIAGNOSIS — E782 Mixed hyperlipidemia: Secondary | ICD-10-CM | POA: Diagnosis not present

## 2020-05-14 DIAGNOSIS — E041 Nontoxic single thyroid nodule: Secondary | ICD-10-CM | POA: Diagnosis not present

## 2020-05-14 DIAGNOSIS — F419 Anxiety disorder, unspecified: Secondary | ICD-10-CM | POA: Diagnosis not present

## 2020-05-14 NOTE — Progress Notes (Signed)
Subjective:    Patient ID: Sharon Fowler, female    DOB: 02/08/1971, 49 y.o.   MRN: [id number]  No chief complaint on file.   HPI Patient was seen today for f/u on HTN.   Pt taking losartan 100 mg daily.  Pt feels like med may not be working, however not checking bp at home.  Pt notes bp elevated at dentist office,  156/84 on wrist.  Pt notes increased anxiety.  Pt worried about thyroid nodule.  Has an appointment with endocrinology in next several weeks to discuss next steps.  Endorses anxiety thinking about bx.  Pt cooking at home.  Eating chicken, fish.  Inquires about foods that are affordable for her family over the next week as [name] is under the boil water advisory.  Pt states she needs to work on increasing vegetable intake.  Pt states she focuses on eating more protein because that is what was recommended after her lap band procedure.  Lap band has since been removed.  Pt notes missing doses of medication a few times per week.  At times patient will take 2 Lipitor 10 mg tabs.  Pt has a form for completion of dental work.  Past Medical History:  Diagnosis Date  . ANEMIA, IRON DEFICIENCY UNSPEC 06/11/2009   corrected with iron replacement---menstrual reelated  . GOITER, MULTINODULAR 07/15/2009  . HYPERLIPIDEMIA 03/25/2008  . HYPERTENSION 03/25/2008  . OBESITY 03/25/2008    Allergies  Allergen Reactions  . Chlorthalidone Other (See Comments)    REACTION: hypokalemia (2.8)  . Lisinopril     cough    ROS General: Denies fever, chills, night sweats, changes in weight, changes in appetite HEENT: Denies headaches, ear pain, changes in vision, rhinorrhea, sore throat CV: Denies CP, palpitations, SOB, orthopnea Pulm: Denies SOB, cough, wheezing GI: Denies abdominal pain, nausea, vomiting, diarrhea, constipation GU: Denies dysuria, hematuria, frequency, vaginal discharge Msk: Denies muscle cramps, joint pains Neuro: Denies weakness, numbness, tingling Skin: Denies rashes,  bruising Psych: Denies depression, hallucinations +anxiety     Objective:    Blood pressure 128/80, pulse 70, temperature 98.2 F (36.8 C), temperature source Other (Comment), height 5' 4"  (1.626 m), weight 289 lb (131.1 kg), last menstrual period 05/08/2011, SpO2 98 %. Repeat bp 156/84  Gen. Pleasant, obese, well-nourished, in no distress, normal affect   HEENT: [place]/AT, face symmetric, conjunctiva clear, no scleral icterus, PERRLA, EOMI, nares patent without drainage Lungs: no accessory muscle use, CTAB, no wheezes or rales Cardiovascular: RRR, no m/r/g, no peripheral edema Musculoskeletal: No deformities, no cyanosis or clubbing, normal tone Neuro:  A&Ox3, CN II-XII intact, normal gait Skin:  Warm, no lesions/ rash   Wt Readings from Last 3 Encounters:  04/15/20 287 lb (130.2 kg)  09/04/19 286 lb (129.7 kg)  08/05/19 292 lb (132.5 kg)    Lab Results  Component Value Date   WBC 6.4 04/19/2020   HGB 13.2 04/19/2020   HCT 40.0 04/19/2020   PLT 292.0 04/19/2020   GLUCOSE 93 04/19/2020   CHOL 182 04/19/2020   TRIG 76.0 04/19/2020   HDL 45.50 04/19/2020   LDLDIRECT 197.8 10/15/2013   LDLCALC 121 (H) 04/19/2020   ALT 12 04/19/2020   AST 14 04/19/2020   NA 138 04/19/2020   K 4.2 04/19/2020   CL 104 04/19/2020   CREATININE 0.61 04/19/2020   BUN 12 04/19/2020   CO2 28 04/19/2020   TSH 0.67 04/19/2020   HGBA1C 5.7 04/19/2020    Assessment/Plan:  Essential hypertension -BP repeated with  thigh cuff on the right upper arm, 158/72 -Discussed lifestyle modifications -Continue losartan 100 mg. Patient encouraged to take medication consistently -Patient encouraged to check BP at home and keep a log to bring with her to clinic so adjustments can be made -Follow-up in 2-4 weeks  Anxiety -GAD-7 score 3 -PHQ-9 score 2 -Self care encouraged -Consider counseling  Thyroid nodule -TSH 0.67, free T4 0.94 on 04/19/20 -Several nodules noted on thyroid ultrasound -2.5 cm nodule  in left inferior lobe meets criteria for biopsy -Patient encouraged to follow-up with endocrinology to proceed with procedure -Remaining nodules to be followed yearly  Mixed hyperlipidemia -LDL 121 on 04/19/20. Other levels normal -Lifestyle modifications encouraged -Pt encouraged to take Lipitor 10 mg consistently   Form completed and to be faxed [facility].  F/u in 1 month, sooner as needed  Grier Mitts, MD

## 2020-05-14 NOTE — Patient Instructions (Signed)
Managing Your Hypertension Hypertension is commonly called high blood pressure. This is when the force of your blood pressing against the walls of your arteries is too strong. Arteries are blood vessels that carry blood from your heart throughout your body. Hypertension forces the heart to work harder to pump blood, and may cause the arteries to become narrow or stiff. Having untreated or uncontrolled hypertension can cause heart attack, stroke, kidney disease, and other problems. What are blood pressure readings? A blood pressure reading consists of a higher number over a lower number. Ideally, your blood pressure should be below 120/80. The first ("top") number is called the systolic pressure. It is a measure of the pressure in your arteries as your heart beats. The second ("bottom") number is called the diastolic pressure. It is a measure of the pressure in your arteries as the heart relaxes. What does my blood pressure reading mean? Blood pressure is classified into four stages. Based on your blood pressure reading, your health care provider may use the following stages to determine what type of treatment you need, if any. Systolic pressure and diastolic pressure are measured in a unit called mm Hg. Normal  Systolic pressure: below 078.  Diastolic pressure: below 80. Elevated  Systolic pressure: 675-449.  Diastolic pressure: below 80. Hypertension stage 1  Systolic pressure: 201-007.  Diastolic pressure: 12-19. Hypertension stage 2  Systolic pressure: 758 or above.  Diastolic pressure: 90 or above. What health risks are associated with hypertension? Managing your hypertension is an important responsibility. Uncontrolled hypertension can lead to:  A heart attack.  A stroke.  A weakened blood vessel (aneurysm).  Heart failure.  Kidney damage.  Eye damage.  Metabolic syndrome.  Memory and concentration problems. What changes can I make to manage my  hypertension? Hypertension can be managed by making lifestyle changes and possibly by taking medicines. Your health care provider will help you make a plan to bring your blood pressure within a normal range. Eating and drinking   Eat a diet that is high in fiber and potassium, and low in salt (sodium), added sugar, and fat. An example eating plan is called the DASH (Dietary Approaches to Stop Hypertension) diet. To eat this way: ? Eat plenty of fresh fruits and vegetables. Try to fill half of your plate at each meal with fruits and vegetables. ? Eat whole grains, such as whole wheat pasta, brown rice, or whole grain bread. Fill about one quarter of your plate with whole grains. ? Eat low-fat diary products. ? Avoid fatty cuts of meat, processed or cured meats, and poultry with skin. Fill about one quarter of your plate with lean proteins such as fish, chicken without skin, beans, eggs, and tofu. ? Avoid premade and processed foods. These tend to be higher in sodium, added sugar, and fat.  Reduce your daily sodium intake. Most people with hypertension should eat less than 1,500 mg of sodium a day.  Limit alcohol intake to no more than 1 drink a day for nonpregnant women and 2 drinks a day for men. One drink equals 12 oz of beer, 5 oz of wine, or 1 oz of hard liquor. Lifestyle  Work with your health care provider to maintain a healthy body weight, or to lose weight. Ask what an ideal weight is for you.  Get at least 30 minutes of exercise that causes your heart to beat faster (aerobic exercise) most days of the week. Activities may include walking, swimming, or biking.  Include exercise  to strengthen your muscles (resistance exercise), such as weight lifting, as part of your weekly exercise routine. Try to do these types of exercises for 30 minutes at least 3 days a week.  Do not use any products that contain nicotine or tobacco, such as cigarettes and e-cigarettes. If you need help quitting,  ask your health care provider.  Control any long-term (chronic) conditions you have, such as high cholesterol or diabetes. Monitoring  Monitor your blood pressure at home as told by your health care provider. Your personal target blood pressure may vary depending on your medical conditions, your age, and other factors.  Have your blood pressure checked regularly, as often as told by your health care provider. Working with your health care provider  Review all the medicines you take with your health care provider because there may be side effects or interactions.  Talk with your health care provider about your diet, exercise habits, and other lifestyle factors that may be contributing to hypertension.  Visit your health care provider regularly. Your health care provider can help you create and adjust your plan for managing hypertension. Will I need medicine to control my blood pressure? Your health care provider may prescribe medicine if lifestyle changes are not enough to get your blood pressure under control, and if:  Your systolic blood pressure is 130 or higher.  Your diastolic blood pressure is 80 or higher. Take medicines only as told by your health care provider. Follow the directions carefully. Blood pressure medicines must be taken as prescribed. The medicine does not work as well when you skip doses. Skipping doses also puts you at risk for problems. Contact a health care provider if:  You think you are having a reaction to medicines you have taken.  You have repeated (recurrent) headaches.  You feel dizzy.  You have swelling in your ankles.  You have trouble with your vision. Get help right away if:  You develop a severe headache or confusion.  You have unusual weakness or numbness, or you feel faint.  You have severe pain in your chest or abdomen.  You vomit repeatedly.  You have trouble breathing. Summary  Hypertension is when the force of blood pumping  through your arteries is too strong. If this condition is not controlled, it may put you at risk for serious complications.  Your personal target blood pressure may vary depending on your medical conditions, your age, and other factors. For most people, a normal blood pressure is less than 120/80.  Hypertension is managed by lifestyle changes, medicines, or both. Lifestyle changes include weight loss, eating a healthy, low-sodium diet, exercising more, and limiting alcohol. This information is not intended to replace advice given to you by your health care provider. Make sure you discuss any questions you have with your health care provider. Document Revised: 02/07/2019 Document Reviewed: 09/13/2016 Elsevier Patient Education  [place].  How to Take Your Blood Pressure You can take your blood pressure at home with a machine. You may need to check your blood pressure at home:  To check if you have high blood pressure (hypertension).  To check your blood pressure over time.  To make sure your blood pressure medicine is working. Supplies needed: You will need a blood pressure machine, or monitor. You can buy one at a drugstore or online. When choosing one:  Choose one with an arm cuff.  Choose one that wraps around your upper arm. Only one finger should fit between your arm  and the cuff.  Do not choose one that measures your blood pressure from your wrist or finger. Your doctor can suggest a monitor. How to prepare Avoid these things for 30 minutes before checking your blood pressure:  Drinking caffeine.  Drinking alcohol.  Eating.  Smoking.  Exercising. Five minutes before checking your blood pressure:  Pee.  Sit in a dining chair. Avoid sitting in a soft couch or armchair.  Be quiet. Do not talk. How to take your blood pressure Follow the instructions that came with your machine. If you have a digital blood pressure monitor, these may be the  instructions: 1. Sit up straight. 2. Place your feet on the floor. Do not cross your ankles or legs. 3. Rest your left arm at the level of your heart. You may rest it on a table, desk, or chair. 4. Pull up your shirt sleeve. 5. Wrap the blood pressure cuff around the upper part of your left arm. The cuff should be 1 inch (2.5 cm) above your elbow. It is best to wrap the cuff around bare skin. 6. Fit the cuff snugly around your arm. You should be able to place only one finger between the cuff and your arm. 7. Put the cord inside the groove of your elbow. 8. Press the power button. 9. Sit quietly while the cuff fills with air and loses air. 10. Write down the numbers on the screen. 11. Wait 2-3 minutes and then repeat steps 1-10. What do the numbers mean? Two numbers make up your blood pressure. The first number is called systolic pressure. The second is called diastolic pressure. An example of a blood pressure reading is "120 over 80" (or 120/80). If you are an adult and do not have a medical condition, use this guide to find out if your blood pressure is normal: Normal  First number: below 120.  Second number: below 80. Elevated  First number: 120-129.  Second number: below 80. Hypertension stage 1  First number: 130-139.  Second number: 80-89. Hypertension stage 2  First number: 140 or above.  Second number: 69 or above. Your blood pressure is above normal even if only the top or bottom number is above normal. Follow these instructions at home:  Check your blood pressure as often as your doctor tells you to.  Take your monitor to your next doctor's appointment. Your doctor will: ? Make sure you are using it correctly. ? Make sure it is working right.  Make sure you understand what your blood pressure numbers should be.  Tell your doctor if your medicines are causing side effects. Contact a doctor if:  Your blood pressure keeps being high. Get help right away  if:  Your first blood pressure number is higher than 180.  Your second blood pressure number is higher than 120. This information is not intended to replace advice given to you by your health care provider. Make sure you discuss any questions you have with your health care provider. Document Revised: 09/28/2017 Document Reviewed: 03/24/2016 Elsevier Patient Education  [place], Adult After being diagnosed with an anxiety disorder, you may be relieved to know why you have felt or behaved a certain way. You may also feel overwhelmed about the treatment ahead and what it will mean for your life. With care and support, you can manage this condition and recover from it. How to manage lifestyle changes Managing stress and anxiety  Stress is your body's reaction to life  changes and events, both good and bad. Most stress will last just a few hours, but stress can be ongoing and can lead to more than just stress. Although stress can play a major role in anxiety, it is not the same as anxiety. Stress is usually caused by something external, such as a deadline, test, or competition. Stress normally passes after the triggering event has ended.  Anxiety is caused by something internal, such as imagining a terrible outcome or worrying that something will go wrong that will devastate you. Anxiety often does not go away even after the triggering event is over, and it can become long-term (chronic) worry. It is important to understand the differences between stress and anxiety and to manage your stress effectively so that it does not lead to an anxious response. Talk with your health care provider or a counselor to learn more about reducing anxiety and stress. He or she may suggest tension reduction techniques, such as:  Music therapy. This can include creating or listening to music that you enjoy and that inspires you.  Mindfulness-based meditation. This involves being aware of your  normal breaths while not trying to control your breathing. It can be done while sitting or walking.  Centering prayer. This involves focusing on a word, phrase, or sacred image that means something to you and brings you peace.  Deep breathing. To do this, expand your stomach and inhale slowly through your nose. Hold your breath for 3-5 seconds. Then exhale slowly, letting your stomach muscles relax.  Self-talk. This involves identifying thought patterns that lead to anxiety reactions and changing those patterns.  Muscle relaxation. This involves tensing muscles and then relaxing them. Choose a tension reduction technique that suits your lifestyle and personality. These techniques take time and practice. Set aside 5-15 minutes a day to do them. Therapists can offer counseling and training in these techniques. The training to help with anxiety may be covered by some insurance plans. Other things you can do to manage stress and anxiety include:  Keeping a stress/anxiety diary. This can help you learn what triggers your reaction and then learn ways to manage your response.  Thinking about how you react to certain situations. You may not be able to control everything, but you can control your response.  Making time for activities that help you relax and not feeling guilty about spending your time in this way.  Visual imagery and yoga can help you stay calm and relax.  Medicines Medicines can help ease symptoms. Medicines for anxiety include:  Anti-anxiety drugs.  Antidepressants. Medicines are often used as a primary treatment for anxiety disorder. Medicines will be prescribed by a health care provider. When used together, medicines, psychotherapy, and tension reduction techniques may be the most effective treatment. Relationships Relationships can play a big part in helping you recover. Try to spend more time connecting with trusted friends and family members. Consider going to couples  counseling, taking family education classes, or going to family therapy. Therapy can help you and others better understand your condition. How to recognize changes in your anxiety Everyone responds differently to treatment for anxiety. Recovery from anxiety happens when symptoms decrease and stop interfering with your daily activities at home or work. This may mean that you will start to:  Have better concentration and focus. Worry will interfere less in your daily thinking.  Sleep better.  Be less irritable.  Have more energy.  Have improved memory. It is important to recognize when your condition  is getting worse. Contact your health care provider if your symptoms interfere with home or work and you feel like your condition is not improving. Follow these instructions at home: Activity  Exercise. Most adults should do the following: ? Exercise for at least 150 minutes each week. The exercise should increase your heart rate and make you sweat (moderate-intensity exercise). ? Strengthening exercises at least twice a week.  Get the right amount and quality of sleep. Most adults need 7-9 hours of sleep each night. Lifestyle   Eat a healthy diet that includes plenty of vegetables, fruits, whole grains, low-fat dairy products, and lean protein. Do not eat a lot of foods that are high in solid fats, added sugars, or salt.  Make choices that simplify your life.  Do not use any products that contain nicotine or tobacco, such as cigarettes, e-cigarettes, and chewing tobacco. If you need help quitting, ask your health care provider.  Avoid caffeine, alcohol, and certain over-the-counter cold medicines. These may make you feel worse. Ask your pharmacist which medicines to avoid. General instructions  Take over-the-counter and prescription medicines only as told by your health care provider.  Keep all follow-up visits as told by your health care provider. This is important. Where to find  support You can get help and support from these sources:  Self-help groups.  Online and [employer].  A trusted spiritual leader.  Couples counseling.  Family education classes.  Family therapy. Where to find more information You may find that joining a support group helps you deal with your anxiety. The following sources can help you locate counselors or support groups near you:  [place]: www.mentalhealth[place].net  Anxiety and Depression Association of [place] (ADAA): https://www.[place]ark.net/  National Alliance on Mental Illness (NAMI): www.nami.org Contact a health care provider if you:  Have a hard time staying focused or finishing daily tasks.  Spend many hours a day feeling worried about everyday life.  Become exhausted by worry.  Start to have headaches, feel tense, or have nausea.  Urinate more than normal.  Have diarrhea. Get help right away if you have:  A racing heart and shortness of breath.  Thoughts of hurting yourself or others. If you ever feel like you may hurt yourself or others, or have thoughts about taking your own life, get help right away. You can go to your nearest emergency department or call:  Your local emergency services (911 in the [place]  A suicide crisis helpline, such as the [place] at 316-416-1889. This is open 24 hours a day. Summary  Taking steps to learn and use tension reduction techniques can help calm you and help prevent triggering an anxiety reaction.  When used together, medicines, psychotherapy, and tension reduction techniques may be the most effective treatment.  Family, friends, and partners can play a big part in helping you recover from an anxiety disorder. This information is not intended to replace advice given to you by your health care provider. Make sure you discuss any questions you have with your health care provider. Document Revised: 03/18/2019 Document Reviewed:  03/18/2019 Elsevier Patient Education  [place].

## 2020-06-08 ENCOUNTER — Ambulatory Visit: Payer: No Typology Code available for payment source | Admitting: Endocrinology

## 2020-06-10 MED FILL — LOSARTAN POTASSIUM 100 MG T: 100 | 30 days supply | Qty: 30 | Fill #2

## 2020-06-15 ENCOUNTER — Encounter: Payer: Self-pay | Admitting: Endocrinology

## 2020-06-15 ENCOUNTER — Other Ambulatory Visit: Payer: Self-pay

## 2020-06-15 ENCOUNTER — Ambulatory Visit (INDEPENDENT_AMBULATORY_CARE_PROVIDER_SITE_OTHER): Payer: No Typology Code available for payment source | Admitting: Endocrinology

## 2020-06-15 VITALS — BP 142/84 | HR 89 | Ht 64.0 in | Wt 298.8 lb

## 2020-06-15 DIAGNOSIS — E042 Nontoxic multinodular goiter: Secondary | ICD-10-CM | POA: Diagnosis not present

## 2020-06-15 NOTE — Progress Notes (Signed)
Subjective:    Patient ID: Sharon Fowler, female    DOB: Dec 26, 1970, 49 y.o.   MRN: [id number]  HPI Pt is referred by Dr Volanda Napoleon, for nodular thyroid.  Pt was noted to have MNG in 2005.  I saw pt in 2010.  At that time, the goiter was not big enough to merit bx.  she has no h/o XRT or surgery to the neck.  Only symptom is mild solid dysphagia.   Past Medical History:  Diagnosis Date  . ANEMIA, IRON DEFICIENCY UNSPEC 06/11/2009   corrected with iron replacement---menstrual reelated  . GOITER, MULTINODULAR 07/15/2009  . HYPERLIPIDEMIA 03/25/2008  . HYPERTENSION 03/25/2008  . OBESITY 03/25/2008    Past Surgical History:  Procedure Laterality Date  . CESAREAN SECTION  7/91, 5/95   x2  . CHOLECYSTECTOMY  10/91   laparascopy  . ESOPHAGOGASTRODUODENOSCOPY (EGD) WITH PROPOFOL N/A 08/16/2017   Procedure: ESOPHAGOGASTRODUODENOSCOPY (EGD) WITH PROPOFOL;  Surgeon: Alphonsa Overall, MD;  Location: [facility];  Service: General;  Laterality: N/A;  . LAPAROSCOPIC GASTRIC BANDING  2011  . TUBAL LIGATION     01/2003    Social History   Socioeconomic History  . Marital status: Legally Separated    Spouse name: Not on file  . Number of children: Not on file  . Years of education: Not on file  . Highest education level: Not on file  Occupational History  . Not on file  Tobacco Use  . Smoking status: Former Smoker    Quit date: 01/02/1991    Years since quitting: 29.4  . Smokeless tobacco: Never Used  Vaping Use  . Vaping Use: Never used  Substance and Sexual Activity  . Alcohol use: Yes    Alcohol/week: 0.0 standard drinks    Comment: occ  . Drug use: No  . Sexual activity: Not Currently  Other Topics Concern  . 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    She likes to sleep when she is not at work.    Social Determinants of Health   Financial Resource Strain:   . Difficulty of Paying  Living Expenses: Not on file  Food Insecurity:   . Worried About Charity fundraiser in the Last Year: Not on file  . Ran Out of Food in the Last Year: Not on file  Transportation Needs:   . Lack of Transportation (Medical): Not on file  . Lack of Transportation (Non-Medical): Not on file  Physical Activity:   . Days of Exercise per Week: Not on file  . Minutes of Exercise per Session: Not on file  Stress:   . Feeling of Stress : Not on file  Social Connections:   . Frequency of Communication with Friends and Family: Not on file  . Frequency of Social Gatherings with Friends and Family: Not on file  . Attends Religious Services: Not on file  . Active Member of Clubs or Organizations: Not on file  . Attends Archivist Meetings: Not on file  . Marital Status: Not on file  Intimate Partner Violence:   . Fear of Current or Ex-Partner: Not on file  . Emotionally Abused: Not on file  . Physically Abused: Not on file  . Sexually Abused: Not on file    Current Outpatient Medications on File Prior to Visit  Medication Sig Dispense Refill  .  atorvastatin (LIPITOR) 10 MG tablet TAKE 1 TABLET (10 MG TOTAL) BY MOUTH DAILY. 90 tablet 2  . cetirizine (ZYRTEC) 10 MG chewable tablet Chew 10 mg by mouth daily.    Marland Kitchen losartan (COZAAR) 100 MG tablet TAKE 1 TABLET (100 MG TOTAL) BY MOUTH DAILY. 30 tablet 4  . oxybutynin (DITROPAN-XL) 10 MG 24 hr tablet Take by mouth 2 (two) times daily.     No current facility-administered medications on file prior to visit.    Allergies  Allergen Reactions  . Chlorthalidone Other (See Comments)    REACTION: hypokalemia (2.8)  . Lisinopril     cough    Family History  Problem Relation Age of Onset  . Diabetes Mother   . Stroke Mother   . Alcohol abuse Father   . Stroke Father   . Goiter Neg Hx     BP (!) 142/84   Pulse 89   Ht 5' 4"  (1.626 m)   Wt 298 lb 12.8 oz (135.5 kg)   LMP 05/08/2011 (Approximate)   SpO2 99%   BMI 51.29 kg/m      Review of Systems Denies hoarseness, neck pain, sob, cough, diarrhea, and flushing.       Objective:   Physical Exam VITAL SIGNS:  See vs page GENERAL: no distress NECK: bilat thyroid nodules are palpable, but I cannot appreciate the LLP nodule.     Lab Results  Component Value Date   TSH 0.67 04/19/2020   [place]: MNG is noted  I have reviewed outside records, and summarized: Pt was noted to have MNG, and referred here.  Main probs addressed were HTN and anxiety      Assessment & Plan:  MNG, new to me, uncertain etiology Dysphagia: I told pt this is not related to [place]  Patient Instructions  Let's check the biopsy, guided by the ultrasound.  you will receive a phone call, about a day and time for an appointment. If the result is low risk, please come back for a follow-up appointment in 6 months.  most of the time, a "lumpy thyroid" will eventually become overactive.  this is usually a slow process, happening over the span of many years.

## 2020-06-15 NOTE — Patient Instructions (Addendum)
Let's check the biopsy, guided by the ultrasound.  you will receive a phone call, about a day and time for an appointment. If the result is low risk, please come back for a follow-up appointment in 6 months.  most of the time, a "lumpy thyroid" will eventually become overactive.  this is usually a slow process, happening over the span of many years.

## 2020-06-30 ENCOUNTER — Other Ambulatory Visit: Payer: Self-pay | Admitting: Endocrinology

## 2020-07-07 ENCOUNTER — Other Ambulatory Visit (HOSPITAL_COMMUNITY)
Admission: RE | Admit: 2020-07-07 | Discharge: 2020-07-07 | Disposition: A | Discharge status: Home / Self Care | Payer: No Typology Code available for payment source | Source: Ambulatory Visit | Attending: Endocrinology | Admitting: Endocrinology

## 2020-07-07 ENCOUNTER — Ambulatory Visit
Admission: RE | Admit: 2020-07-07 | Discharge: 2020-07-07 | Disposition: A | Discharge status: Home / Self Care | Payer: No Typology Code available for payment source | Source: Ambulatory Visit | Attending: Endocrinology | Admitting: Endocrinology

## 2020-07-07 DIAGNOSIS — E042 Nontoxic multinodular goiter: Secondary | ICD-10-CM | POA: Insufficient documentation

## 2020-07-09 LAB — CYTOLOGY - NON PAP

## 2020-07-14 MED FILL — OXYBUTYNIN 5 MG TABLET: 5 | 30 days supply | Qty: 120 | Fill #2

## 2020-07-14 MED FILL — ATORVASTATIN CALCIUM 10 MG: 10 | 90 days supply | Qty: 90 | Fill #1

## 2020-07-14 MED FILL — LOSARTAN POTASSIUM 100 MG T: 100 | 30 days supply | Qty: 30 | Fill #3

## 2020-07-17 ENCOUNTER — Encounter: Payer: Self-pay | Admitting: Family Medicine

## 2020-07-17 ENCOUNTER — Encounter: Payer: Self-pay | Admitting: Physician Assistant

## 2020-07-17 ENCOUNTER — Telehealth: Payer: No Typology Code available for payment source | Admitting: Physician Assistant

## 2020-07-17 DIAGNOSIS — R062 Wheezing: Secondary | ICD-10-CM

## 2020-07-17 DIAGNOSIS — R059 Cough, unspecified: Secondary | ICD-10-CM

## 2020-07-17 DIAGNOSIS — R05 Cough: Secondary | ICD-10-CM

## 2020-07-17 MED ORDER — PREDNISONE 10 MG (21) PO TBPK
ORAL_TABLET | ORAL | 0 refills | Status: DC
Start: 2020-07-17 — End: 2020-07-30

## 2020-07-17 MED ORDER — DOXYCYCLINE HYCLATE 100 MG PO TABS
100.0000 mg | ORAL_TABLET | Freq: Two times a day (BID) | ORAL | 0 refills | Status: DC
Start: 2020-07-17 — End: 2021-07-11

## 2020-07-17 NOTE — Progress Notes (Signed)
We are sorry that you are not feeling well.  Here is how we plan to help!  Please see below our treatment, your shortness of breath worsens you need to go to the ED. I do recommend you following up with your PCP on Monday for a chest xray.   Based on your presentation I believe you most likely have A cough due to bacteria.  When patients have a fever and a productive cough with a change in color or increased sputum production, we are concerned about bacterial bronchitis.  If left untreated it can progress to pneumonia.  If your symptoms do not improve with your treatment plan it is important that you contact your provider.   I have prescribed Doxycycline 100 mg twice a day for 7 days     In addition you may use A non-prescription cough medication called Robitussin DAC. Take 2 teaspoons every 8 hours or Delsym: take 2 teaspoons every 12 hours. and A non-prescription cough medication called Mucinex DM: take 2 tablets every 12 hours.  Prednisone 10 mg daily for 6 days (see taper instructions below)  Directions for 6 day taper: Day 1: 2 tablets before breakfast, 1 after both lunch & dinner and 2 at bedtime Day 2: 1 tab before breakfast, 1 after both lunch & dinner and 2 at bedtime Day 3: 1 tab at each meal & 1 at bedtime Day 4: 1 tab at breakfast, 1 at lunch, 1 at bedtime Day 5: 1 tab at breakfast & 1 tab at bedtime Day 6: 1 tab at breakfast   From your responses in the eVisit questionnaire you describe inflammation in the upper respiratory tract which is causing a significant cough.  This is commonly called Bronchitis and has four common causes:    Allergies  Viral Infections  Acid Reflux  Bacterial Infection Allergies, viruses and acid reflux are treated by controlling symptoms or eliminating the cause. An example might be a cough caused by taking certain blood pressure medications. You stop the cough by changing the medication. Another example might be a cough caused by acid reflux.  Controlling the reflux helps control the cough.  USE OF BRONCHODILATOR ("RESCUE") INHALERS: There is a risk from using your bronchodilator too frequently.  The risk is that over-reliance on a medication which only relaxes the muscles surrounding the breathing tubes can reduce the effectiveness of medications prescribed to reduce swelling and congestion of the tubes themselves.  Although you feel brief relief from the bronchodilator inhaler, your asthma may actually be worsening with the tubes becoming more swollen and filled with mucus.  This can delay other crucial treatments, such as oral steroid medications. If you need to use a bronchodilator inhaler daily, several times per day, you should discuss this with your provider.  There are probably better treatments that could be used to keep your asthma under control.     HOME CARE . Only take medications as instructed by your medical team. . Complete the entire course of an antibiotic. . Drink plenty of fluids and get plenty of rest. . Avoid close contacts especially the very young and the elderly . Cover your mouth if you cough or cough into your sleeve. . Always remember to wash your hands . A steam or ultrasonic humidifier can help congestion.   GET HELP RIGHT AWAY IF: . You develop worsening fever. . You become short of breath . You cough up blood. . Your symptoms persist after you have completed your treatment plan MAKE  SURE YOU   Understand these instructions.  Will watch your condition.  Will get help right away if you are not doing well or get worse.  Your e-visit answers were reviewed by a board certified advanced clinical practitioner to complete your personal care plan.  Depending on the condition, your plan could have included both over the counter or prescription medications. If there is a problem please reply  once you have received a response from your provider. Your safety is important to [place].  If you have drug allergies  check your prescription carefully.    You can use MyChart to ask questions about today's visit, request a non-urgent call back, or ask for a work or school excuse for 24 hours related to this e-Visit. If it has been greater than 24 hours you will need to follow up with your provider, or enter a new e-Visit to address those concerns. You will get an e-mail in the next two days asking about your experience.  I hope that your e-visit has been valuable and will speed your recovery. Thank you for using e-visits.  Approximately 5 minutes was spent documenting and reviewing patient's chart.

## 2020-07-19 ENCOUNTER — Telehealth: Payer: Self-pay | Admitting: Family Medicine

## 2020-07-19 MED FILL — DOXYCYCLINE HYCLATE 100 MG: 100 | 10 days supply | Qty: 20 | Fill #0

## 2020-07-19 MED FILL — predniSONE 10 MG TABS: 10 | 6 days supply | Qty: 21 | Fill #0

## 2020-07-19 NOTE — Telephone Encounter (Signed)
LVM for pt to call and set up appt on 10/01 with Dr banks as a virtual appt.

## 2020-07-21 ENCOUNTER — Telehealth: Payer: Self-pay | Admitting: Family Medicine

## 2020-07-21 NOTE — Telephone Encounter (Signed)
Spoke to patient on the phone on 07/21/20- she stated she did an evisit and the dr prescribed her a steroid.  She stated understanding that her visit on October 1st will be a virtual visit.  She wants to keep this visit as October 1st.

## 2020-07-30 ENCOUNTER — Ambulatory Visit: Payer: No Typology Code available for payment source

## 2020-07-30 ENCOUNTER — Other Ambulatory Visit (INDEPENDENT_AMBULATORY_CARE_PROVIDER_SITE_OTHER): Payer: No Typology Code available for payment source

## 2020-07-30 ENCOUNTER — Other Ambulatory Visit: Payer: Self-pay | Admitting: Family Medicine

## 2020-07-30 ENCOUNTER — Encounter: Payer: Self-pay | Admitting: Family Medicine

## 2020-07-30 ENCOUNTER — Other Ambulatory Visit: Payer: Self-pay

## 2020-07-30 ENCOUNTER — Ambulatory Visit (INDEPENDENT_AMBULATORY_CARE_PROVIDER_SITE_OTHER)
Admission: RE | Admit: 2020-07-30 | Discharge: 2020-07-30 | Disposition: A | Discharge status: Home / Self Care | Payer: No Typology Code available for payment source | Source: Ambulatory Visit | Attending: Family Medicine | Admitting: Family Medicine

## 2020-07-30 ENCOUNTER — Telehealth (INDEPENDENT_AMBULATORY_CARE_PROVIDER_SITE_OTHER): Payer: No Typology Code available for payment source | Admitting: Family Medicine

## 2020-07-30 DIAGNOSIS — R0602 Shortness of breath: Secondary | ICD-10-CM

## 2020-07-30 DIAGNOSIS — R600 Localized edema: Secondary | ICD-10-CM

## 2020-07-30 DIAGNOSIS — R059 Cough, unspecified: Secondary | ICD-10-CM

## 2020-07-30 DIAGNOSIS — R12 Heartburn: Secondary | ICD-10-CM

## 2020-07-30 DIAGNOSIS — J40 Bronchitis, not specified as acute or chronic: Secondary | ICD-10-CM

## 2020-07-30 LAB — CBC WITH DIFFERENTIAL/PLATELET
Basophils Absolute: 0.1 10*3/uL (ref 0.0–0.1)
Basophils Relative: 1.2 % (ref 0.0–3.0)
Eosinophils Absolute: 0.1 10*3/uL (ref 0.0–0.7)
Eosinophils Relative: 1.7 % (ref 0.0–5.0)
HCT: 40.2 % (ref 36.0–46.0)
Hemoglobin: 13.2 g/dL (ref 12.0–15.0)
Lymphocytes Relative: 24.3 % (ref 12.0–46.0)
Lymphs Abs: 2.1 10*3/uL (ref 0.7–4.0)
MCHC: 32.8 g/dL (ref 30.0–36.0)
MCV: 81.6 fl (ref 78.0–100.0)
Monocytes Absolute: 0.5 10*3/uL (ref 0.1–1.0)
Monocytes Relative: 5.4 % (ref 3.0–12.0)
Neutro Abs: 5.8 10*3/uL (ref 1.4–7.7)
Neutrophils Relative %: 67.4 % (ref 43.0–77.0)
Platelets: 354 10*3/uL (ref 150.0–400.0)
RBC: 4.93 Mil/uL (ref 3.87–5.11)
RDW: 15.3 % (ref 11.5–15.5)
WBC: 8.6 10*3/uL (ref 4.0–10.5)

## 2020-07-30 LAB — BASIC METABOLIC PANEL
BUN: 10 mg/dL (ref 6–23)
CO2: 28 mEq/L (ref 19–32)
Calcium: 9.2 mg/dL (ref 8.4–10.5)
Chloride: 104 mEq/L (ref 96–112)
Creatinine, Ser: 0.6 mg/dL (ref 0.40–1.20)
GFR: 129.21 mL/min (ref >60.00)
Glucose, Bld: 103 mg/dL — ABNORMAL HIGH (ref 70–99)
Potassium: 4.1 mEq/L (ref 3.5–5.1)
Sodium: 138 mEq/L (ref 135–145)

## 2020-07-30 LAB — BRAIN NATRIURETIC PEPTIDE: Pro B Natriuretic peptide (BNP): 21 pg/mL (ref 0.0–100.0)

## 2020-07-30 LAB — D-DIMER, QUANTITATIVE: D-Dimer, Quant: 0.63 mcg/mL FEU — ABNORMAL HIGH (ref <0.50)

## 2020-07-30 MED ORDER — ALBUTEROL SULFATE HFA 108 (90 BASE) MCG/ACT IN AERS: 2.0000 | INHALATION_SPRAY | Freq: Four times a day (QID) | RESPIRATORY_TRACT | 0 refills | Status: DC | PRN

## 2020-07-30 MED ORDER — PANTOPRAZOLE SODIUM 40 MG PO TBEC: 40.0000 mg | DELAYED_RELEASE_TABLET | Freq: Every day | ORAL | 0 refills | Status: DC

## 2020-07-30 MED FILL — ALBUTEROL SULFATE HFA 108 (: 108 (90 BAS | 25 days supply | Qty: 18 | Fill #0

## 2020-07-30 MED FILL — PANTOPRAZOLE SOD DR 40 MG T: 40 | 30 days supply | Qty: 30 | Fill #0

## 2020-07-30 NOTE — Progress Notes (Signed)
Virtual Visit via Video Note  I connected wit Sharon Fowler on 07/30/20 at  1:00 PM EDT by a video enabled telemedicine application 2/2 [id number] pandemic and verified that I am speaking with the correct person using two identifiers.  Location patient: home Location provider:work or home office Persons participating in the virtual visit: patient, provider  I discussed the limitations of evaluation and management by telemedicine and the availability of in person appointments. The patient expressed understanding and agreed to proceed.   HPI: Pt with a cough x 1 month.  Seen for evisit given doxycycline and prednisone on 9/18 for possible pneumonia.  Pt having less labored breathing, but has to pause when speaking and clear her throat often.  Pt tried zyrtec.  Pt denies HAs, fever, chills.  Pt had some heart burn this am after laying down after eating.  Pt also notes b/l LE edema in feet and ankles.  Denies calf pain.  Pt's husband was dx/d with COVID in Aug.  She states she did not quarantine.   Pt had 2 doses of covid vaccine.  Pt had a biopsy of her thyroid. Per pt biopsy was on L lower thyroid.  Since then she feels like her voice has become more hoarse.  ROS: See pertinent positives and negatives per HPI.  Past Medical History:  Diagnosis Date  . ANEMIA, IRON DEFICIENCY UNSPEC 06/11/2009   corrected with iron replacement---menstrual reelated  . GOITER, MULTINODULAR 07/15/2009  . HYPERLIPIDEMIA 03/25/2008  . HYPERTENSION 03/25/2008  . OBESITY 03/25/2008    Past Surgical History:  Procedure Laterality Date  . CESAREAN SECTION  7/91, 5/95   x2  . CHOLECYSTECTOMY  10/91   laparascopy  . ESOPHAGOGASTRODUODENOSCOPY (EGD) WITH PROPOFOL N/A 08/16/2017   Procedure: ESOPHAGOGASTRODUODENOSCOPY (EGD) WITH PROPOFOL;  Surgeon: Alphonsa Overall, MD;  Location: [facility];  Service: General;  Laterality: N/A;  . LAPAROSCOPIC GASTRIC BANDING  2011  . TUBAL LIGATION     01/2003    Family History   Problem Relation Age of Onset  . Diabetes Mother   . Stroke Mother   . Alcohol abuse Father   . Stroke Father   . Goiter Neg Hx      Current Outpatient Medications:  .  atorvastatin (LIPITOR) 10 MG tablet, TAKE 1 TABLET (10 MG TOTAL) BY MOUTH DAILY., Disp: 90 tablet, Rfl: 2 .  cetirizine (ZYRTEC) 10 MG chewable tablet, Chew 10 mg by mouth daily., Disp: , Rfl:  .  doxycycline (VIBRA-TABS) 100 MG tablet, Take 1 tablet (100 mg total) by mouth 2 (two) times daily., Disp: 20 tablet, Rfl: 0 .  losartan (COZAAR) 100 MG tablet, TAKE 1 TABLET (100 MG TOTAL) BY MOUTH DAILY., Disp: 30 tablet, Rfl: 4 .  oxybutynin (DITROPAN-XL) 10 MG 24 hr tablet, Take by mouth 2 (two) times daily., Disp: , Rfl:   EXAM:  VITALS per patient if applicable:  GENERAL: alert, oriented, appears well and in no acute distress  HEENT: atraumatic, conjunctiva clear, no obvious abnormalities on inspection of external nose and ears  NECK: normal movements of the head and neck  LUNGS: Intermittent dry cough/clearing throat, having 2 pause in between sentences at times. on inspection no signs of respiratory distress, breathing rate appears normal, no obvious gross gasping or wheezing  CV: no obvious cyanosis  MS: moves all visible extremities without noticeable abnormality  PSYCH/NEURO: pleasant and cooperative, no obvious depression or anxiety, speech and thought processing grossly intact  ASSESSMENT AND PLAN:  Discussed the following assessment and  plan:  Cough -Continue despite course of doxycycline and prednisone -Consider possible causes including viral infection/postviral cough, GERD, cannot rule out PE -We will start PPI -Further recommendations to be made based on lab results. - Plan: CBC with Differential/Platelet, DG Chest 2 View, pantoprazole (PROTONIX) 40 MG tablet  SOB (shortness of breath)  - Plan: Basic metabolic panel, D-dimer, Quantitative, CXR  Bilateral leg edema -Possibly 2/2 dependent  edema.  Also consider CHF. -We will obtain D-dimer to rule out DVT - Plan: D-dimer, Quantitative, Brain Natriuretic Peptide  Heart burn  -Avoid foods normal symptoms -Avoid laying down flat after eating -We will start trial of PPI - Plan: pantoprazole (PROTONIX) 40 MG tablet  Follow-up as needed.  Given strict precautions for continued or worsening symptoms  I discussed the assessment and treatment plan with the patient. The patient was provided an opportunity to ask questions and all were answered. The patient agreed with the plan and demonstrated an understanding of the instructions.   The patient was advised to call back or seek an in-person evaluation if the symptoms worsen or if the condition fails to improve as anticipated.  I provided 15 minutes of non-face-to-face time during this encounter.   Billie Ruddy, MD

## 2020-07-30 NOTE — Addendum Note (Signed)
Addended by: Jacob Moores on: 07/30/2020 03:38 PM   Modules accepted: Orders

## 2020-08-02 ENCOUNTER — Encounter: Payer: Self-pay | Admitting: Family Medicine

## 2020-08-03 ENCOUNTER — Encounter: Payer: Self-pay | Admitting: Family Medicine

## 2020-08-04 ENCOUNTER — Other Ambulatory Visit: Payer: Self-pay | Admitting: Family Medicine

## 2020-08-04 DIAGNOSIS — R7989 Other specified abnormal findings of blood chemistry: Secondary | ICD-10-CM

## 2020-08-09 ENCOUNTER — Encounter: Payer: Self-pay | Admitting: Family Medicine

## 2020-08-09 ENCOUNTER — Ambulatory Visit (HOSPITAL_COMMUNITY)
Admission: RE | Admit: 2020-08-09 | Discharge: 2020-08-09 | Disposition: A | Discharge status: Home / Self Care | Payer: No Typology Code available for payment source | Source: Ambulatory Visit | Attending: Cardiology | Admitting: Cardiology

## 2020-08-09 ENCOUNTER — Other Ambulatory Visit: Payer: Self-pay

## 2020-08-09 DIAGNOSIS — R7989 Other specified abnormal findings of blood chemistry: Secondary | ICD-10-CM | POA: Diagnosis present

## 2020-08-24 MED FILL — LOSARTAN POTASSIUM 100 MG T: 100 | 30 days supply | Qty: 30 | Fill #4

## 2020-09-21 ENCOUNTER — Other Ambulatory Visit: Payer: Self-pay | Admitting: Family Medicine

## 2020-09-21 DIAGNOSIS — I10 Essential (primary) hypertension: Secondary | ICD-10-CM

## 2020-09-22 ENCOUNTER — Other Ambulatory Visit: Payer: Self-pay

## 2020-09-22 DIAGNOSIS — I10 Essential (primary) hypertension: Secondary | ICD-10-CM

## 2020-09-22 MED ORDER — LOSARTAN POTASSIUM 100 MG PO TABS: 100.0000 mg | ORAL_TABLET | Freq: Every day | ORAL | 4 refills | Status: DC

## 2020-09-22 MED FILL — LOSARTAN POTASSIUM 100 MG T: 100 | 30 days supply | Qty: 30 | Fill #0

## 2020-09-28 ENCOUNTER — Other Ambulatory Visit (HOSPITAL_COMMUNITY): Payer: Self-pay | Admitting: Obstetrics and Gynecology

## 2020-09-28 MED FILL — OXYBUTYNIN 5 MG TABLET: 5 | 68 days supply | Qty: 270 | Fill #0

## 2020-09-29 ENCOUNTER — Encounter: Payer: Self-pay | Admitting: Family Medicine

## 2020-10-19 MED FILL — LOSARTAN POTASSIUM 100 MG T: 100 | 30 days supply | Qty: 30 | Fill #1

## 2020-10-19 MED FILL — ATORVASTATIN CALCIUM 10 MG: 10 | 90 days supply | Qty: 90 | Fill #2

## 2020-11-04 LAB — COLOGUARD: Cologuard: NEGATIVE

## 2020-11-08 ENCOUNTER — Other Ambulatory Visit: Payer: Self-pay | Admitting: General Surgery

## 2020-11-08 ENCOUNTER — Other Ambulatory Visit (HOSPITAL_COMMUNITY): Payer: Self-pay | Admitting: General Surgery

## 2020-11-09 ENCOUNTER — Ambulatory Visit (HOSPITAL_COMMUNITY)
Admission: RE | Admit: 2020-11-09 | Discharge: 2020-11-09 | Disposition: A | Discharge status: Home / Self Care | Payer: No Typology Code available for payment source | Source: Ambulatory Visit | Attending: General Surgery | Admitting: General Surgery

## 2020-11-09 ENCOUNTER — Other Ambulatory Visit: Payer: Self-pay

## 2020-11-09 DIAGNOSIS — Z6841 Body Mass Index (BMI) 40.0 and over, adult: Secondary | ICD-10-CM | POA: Diagnosis present

## 2020-11-11 LAB — EXTERNAL GENERIC LAB PROCEDURE: COLOGUARD: NEGATIVE

## 2020-11-11 LAB — COLOGUARD
COLOGUARD: NEGATIVE
Cologuard: NEGATIVE

## 2020-11-19 ENCOUNTER — Encounter: Payer: Self-pay | Admitting: Family Medicine

## 2020-11-25 MED FILL — LOSARTAN POTASSIUM 100 MG T: 100 | 30 days supply | Qty: 30 | Fill #2

## 2020-12-02 ENCOUNTER — Encounter: Payer: Self-pay | Admitting: Skilled Nursing Facility1

## 2020-12-02 ENCOUNTER — Other Ambulatory Visit: Payer: Self-pay

## 2020-12-02 ENCOUNTER — Encounter: Payer: No Typology Code available for payment source | Attending: General Surgery | Admitting: Skilled Nursing Facility1

## 2020-12-02 DIAGNOSIS — E669 Obesity, unspecified: Secondary | ICD-10-CM | POA: Diagnosis not present

## 2020-12-02 NOTE — Progress Notes (Signed)
Nutrition Assessment for Bariatric Surgery Medical Nutrition Therapy Appt Start Time: 7:31 End Time: 8:32  Patient was seen on 12/02/20 for Pre-Operative Nutrition Assessment. Letter of approval faxed to [facility] bariatric surgery program coordinator on 12/02/20  Referral stated Supervised Weight Loss (SWL) visits needed: 3  Planned surgery: Sleeve Gastrectomy or RYGB Pt expectation of surgery: to lose weight Pt expectation of dietitian: Guidance on what to eat    NUTRITION ASSESSMENT   Anthropometrics  Start weight at [facility]: 298.4 lbs (date: 12/02/2020)  Height: 64 in BMI: 51.22 kg/m2     Clinical  Medical hx: HTN Medications: see list Labs:  Notable signs/symptoms:  Any previous deficiencies? No  Micronutrient Nutrition Focused Physical Exam: Hair: No issues observed Eyes: No issues observed Mouth: No issues observed Neck: No issues observed Nails: No issues observed Skin: No issues observed  Lifestyle & Dietary Hx  Pt states she is unsure what procedure she wants. Pt states she has bought a new car and home so she is feeling good about life. Pt states she has a "little one" on the way stating she did not want that to happen until she reached her weight goal. Pt states she does not drink milk or thin milkshakes or eat pork. Pt states she struggles with getting her blood pressure under control.   Pt has had the gastric band and then it was removed this appt being the third bariatric assessment she has had, with minimal health changes. Dietitian asked pt: What have you learned from your journey: Pts response: I have to put in the work, it is easier to put on the weight than lose it, I am not motivated to be committed to be physically active, healthier people are falling out what is the point to be phsycailly active: Dietitian explained there are not variables with CVD not just activity.   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 Pt states she has not worked out in the house because her mattress calls her (she rather sleep than be active). Wakes at 11am to let dogs out. Pt states she will snack if she is sleepy. Pt states she still struggles with getting water in. Pt states pressure incontinence keeps her from drinking enough water.   24-Hr Dietary Recall First Meal 7:15-7:30: granola + oatmeal + crunchy peanut butter Snack: up at 11 does not eat Second Meal: 9:30pm: baked chicken or meat loaf or lasagna Snack: honey bun Third Meal:  Snack:  Beverages: soda, sweet tea, water   Estimated Energy Needs Calories: 1500   NUTRITION DIAGNOSIS  Overweight/obesity ([place]-3.3) related to past poor dietary habits and physical inactivity as evidenced by patient w/ planned sleeve or RYGB surgery following dietary guidelines for continued weight loss.    NUTRITION INTERVENTION  Nutrition counseling (C-1) and education (E-2) to facilitate bariatric surgery goals.   Pre-Op Goals Reviewed with the Patient . Track food and beverage intake (pen and paper, MyFitness Pal, Baritastic app, etc.) . Make healthy food choices while monitoring portion sizes . Consume 3 meals per day or try to eat every 3-5 hours . Avoid concentrated sugars and fried foods . Keep sugar & fat in the single digits per serving on food labels . Practice CHEWING your food (aim for applesauce consistency) . Practice not drinking 15 minutes before, during, and 30 minutes after each meal and snack . Avoid all carbonated  beverages (ex: soda, sparkling beverages)  . Limit caffeinated beverages (ex: coffee, tea, energy drinks) . Avoid all sugar-sweetened beverages (ex: regular soda, sports drinks)  . Avoid alcohol  . Aim for 64-100 ounces of FLUID daily (with at least half of fluid intake being plain water)  . Aim for at least 60-80 grams of PROTEIN daily . Look for a liquid protein source that contains ?15 g protein and  ?5 g carbohydrate (ex: shakes, drinks, shots) . Make a list of non-food related activities . Physical activity is an important part of a healthy lifestyle so keep it moving! The goal is to reach 150 minutes of exercise per week, including cardiovascular and weight baring activity.  Goals: Drink 64 fluid ounces per day  *Goals that are bolded indicate the pt would like to start working towards these  Handouts Provided Include  . Bariatric Surgery handouts (Nutrition Visits, Pre-Op Goals, Protein Shakes, Vitamins & Minerals)  Learning Style & Readiness for Change Teaching method utilized: Visual & Auditory  Demonstrated degree of understanding via: Teach Back  Readiness Level: pre contemplative  Barriers to learning/adherence to lifestyle change: lack of intrinsic motivation  RD's Notes for Next Visit . Assess pts adherence to chosen goals     MONITORING & EVALUATION Dietary intake, weekly physical activity, body weight, and pre-op goals reached at next nutrition visit.    Next Steps  Patient is to follow up at [facility] for Pre-Op Class >2 weeks before surgery for further nutrition education.

## 2020-12-16 ENCOUNTER — Ambulatory Visit: Payer: No Typology Code available for payment source | Admitting: Endocrinology

## 2020-12-23 MED FILL — LOSARTAN POTASSIUM 100 MG T: 100 | 30 days supply | Qty: 30 | Fill #3

## 2021-01-06 ENCOUNTER — Encounter: Payer: No Typology Code available for payment source | Attending: General Surgery | Admitting: Skilled Nursing Facility1

## 2021-01-06 ENCOUNTER — Other Ambulatory Visit: Payer: Self-pay

## 2021-01-06 DIAGNOSIS — E669 Obesity, unspecified: Secondary | ICD-10-CM | POA: Insufficient documentation

## 2021-01-06 NOTE — Progress Notes (Signed)
SWL Medical Nutrition Therapy Appt Start Time: 7:31 End Time: 8:00  Referral stated Supervised Weight Loss (SWL) visits needed: 3  Planned surgery: Sleeve Gastrectomy or RYGB Pt expectation of surgery: to lose weight Pt expectation of dietitian: Guidance on what to eat    NUTRITION ASSESSMENT   Anthropometrics  Start weight at [facility]: 298.4 lbs (date: 12/02/2020)  Weight: 301.1 Height: 64 in BMI: 51.68 kg/m2     Clinical  Medical hx: HTN Medications: see list Labs:  Notable signs/symptoms:  Any previous deficiencies? No   Lifestyle & Dietary Hx   Pt states she has been working on her kegels stating they are weird feeling but sees where they are helping. Pt states drinking enough water is still a struggle. Pt states she has tried to use a smaller bottle and drink more of that which has made it a little better stating she does drink because she does not feel thirst. Pt states she needs to do better at not relying on feeling the cue of thrist and drink anyway. Pt states she wants to start walking again.   24-Hr Dietary Recall First Meal 7:15-7:30: granola + oatmeal + crunchy peanut butter Snack:  Second Meal: 9:30pm: baked chicken or meat loaf or lasagna Snack: 2 fruit cups throughout the night Third Meal:  Snack:  Beverages: soda, sweet tea, water   Estimated Energy Needs Calories: 1500   NUTRITION DIAGNOSIS  Overweight/obesity ([place]-3.3) related to past poor dietary habits and physical inactivity as evidenced by patient w/ planned sleeve or RYGB surgery following dietary guidelines for continued weight loss.   NUTRITION INTERVENTION  Nutrition counseling (C-1) and education (E-2) to facilitate bariatric surgery goals.  Pre-Op Goals Progress & New Goals -Per hour your goal is to drink half a bottle of water: have a bottle of water in front of you as a visual cue; at home when you change out your dogs water let be a cue for you to drink half a bottle of water -Have a   Meal at 11-12 while the dogs are eating: a sandwich with baby carrots maybe in the microwave OR salad: cheese, [place], onion, peppers, tomatoes, egg, Rather beans   Handouts Provided Include     Learning Style & Readiness for Change Teaching method utilized: Visual & Auditory  Demonstrated degree of understanding via: Teach Back  Readiness Level: pre contemplative  Barriers to learning/adherence to lifestyle change: lack of intrinsic motivation  RD's Notes for Next Visit . Assess pts adherence to chosen goals     MONITORING & EVALUATION Dietary intake, weekly physical activity, body weight, and pre-op goals reached at next nutrition visit.    Next Steps  Patient is to follow up at [facility] for SWL

## 2021-01-21 ENCOUNTER — Other Ambulatory Visit: Payer: Self-pay

## 2021-01-21 ENCOUNTER — Ambulatory Visit: Payer: No Typology Code available for payment source | Admitting: Endocrinology

## 2021-01-21 VITALS — BP 136/86 | HR 82 | Ht 64.0 in | Wt 303.6 lb

## 2021-01-21 DIAGNOSIS — E042 Nontoxic multinodular goiter: Secondary | ICD-10-CM | POA: Diagnosis not present

## 2021-01-21 NOTE — Progress Notes (Signed)
Subjective:    Patient ID: Sharon Fowler, female    DOB: 03/01/1971, 50 y.o.   MRN: [id number]  HPI  Pt returns for f/u of MNG (dx'ed 2005; 2.5 cm LLP nodule bx in 2021 was cat 2; TSH is low-normal off rx).  Pt says thyroid is slightly swollen, but not changed since last ov.   Past Medical History:  Diagnosis Date  . ANEMIA, IRON DEFICIENCY UNSPEC 06/11/2009   corrected with iron replacement---menstrual reelated  . GOITER, MULTINODULAR 07/15/2009  . HYPERLIPIDEMIA 03/25/2008  . HYPERTENSION 03/25/2008  . OBESITY 03/25/2008    Past Surgical History:  Procedure Laterality Date  . CESAREAN SECTION  7/91, 5/95   x2  . CHOLECYSTECTOMY  10/91   laparascopy  . ESOPHAGOGASTRODUODENOSCOPY (EGD) WITH PROPOFOL N/A 08/16/2017   Procedure: ESOPHAGOGASTRODUODENOSCOPY (EGD) WITH PROPOFOL;  Surgeon: Alphonsa Overall, MD;  Location: [facility];  Service: General;  Laterality: N/A;  . LAPAROSCOPIC GASTRIC BANDING  2011  . TUBAL LIGATION     01/2003    Social History   Socioeconomic History  . Marital status: Legally Separated    Spouse name: Not on file  . Number of children: Not on file  . Years of education: Not on file  . Highest education level: Not on file  Occupational History  . Not on file  Tobacco Use  . Smoking status: Former Smoker    Quit date: 01/02/1991    Years since quitting: 30.0  . Smokeless tobacco: Never Used  Vaping Use  . Vaping Use: Never used  Substance and Sexual Activity  . Alcohol use: Yes    Alcohol/week: 0.0 standard drinks    Comment: occ  . Drug use: No  . Sexual activity: Not Currently  Other Topics Concern  . 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    She likes to sleep when she is not at work.    Social Determinants of Health   Financial Resource Strain: Not on file  Food Insecurity: Not on file  Transportation Needs: Not on file  Physical  Activity: Not on file  Stress: Not on file  Social Connections: Not on file  Intimate Partner Violence: Not on file    Current Outpatient Medications on File Prior to Visit  Medication Sig Dispense Refill  . atorvastatin (LIPITOR) 10 MG tablet TAKE 1 TABLET (10 MG TOTAL) BY MOUTH DAILY. 90 tablet 2  . losartan (COZAAR) 100 MG tablet Take 1 tablet (100 mg total) by mouth daily. 30 tablet 4  . oxybutynin (DITROPAN-XL) 10 MG 24 hr tablet Take by mouth 2 (two) times daily.    Marland Kitchen albuterol (VENTOLIN HFA) 108 (90 Base) MCG/ACT inhaler Inhale 2 puffs into the lungs every 6 (six) hours as needed for wheezing or shortness of breath. 8 g 0  . cetirizine (ZYRTEC) 10 MG chewable tablet Chew 10 mg by mouth daily.    Marland Kitchen doxycycline (VIBRA-TABS) 100 MG tablet Take 1 tablet (100 mg total) by mouth 2 (two) times daily. 20 tablet 0  . pantoprazole (PROTONIX) 40 MG tablet Take 1 tablet (40 mg total) by mouth daily. 30 tablet 0   No current facility-administered medications on file prior to visit.    Allergies  Allergen Reactions  . Chlorthalidone Other (See Comments)    REACTION: hypokalemia (2.8)  . Lisinopril     cough  Family History  Problem Relation Age of Onset  . Diabetes Mother   . Stroke Mother   . Alcohol abuse Father   . Stroke Father   . Goiter Neg Hx     BP 136/86 (BP Location: Right Arm, Patient Position: Sitting, Cuff Size: Large)   Pulse 82   Ht 5' 4"  (1.626 m)   Wt (!) 303 lb 9.6 oz (137.7 kg)   LMP 05/08/2011 (Approximate)   SpO2 95%   BMI 52.11 kg/m    Review of Systems     Objective:   Physical Exam VITAL SIGNS:  See vs page GENERAL: no distress NECK: thyroid is twice normal size, with MNG surface (R>L).     Lab Results  Component Value Date   TSH 0.67 04/19/2020       Assessment & Plan:  MNG, due for recheck  Patient Instructions  Let's recheck the ultrasound.  you will receive a phone call, about a day and time for an appointment. Please come back  for a follow-up appointment in 1 year.  Please have the thyroid blood test checked at your annual checkup with Dr Volanda Napoleon. most of the time, a "lumpy thyroid" will eventually become overactive.  this is usually a slow process, happening over the span of many years.

## 2021-01-21 NOTE — Patient Instructions (Signed)
Let's recheck the ultrasound.  you will receive a phone call, about a day and time for an appointment. Please come back for a follow-up appointment in 1 year.  Please have the thyroid blood test checked at your annual checkup with Dr Volanda Napoleon. most of the time, a "lumpy thyroid" will eventually become overactive.  this is usually a slow process, happening over the span of many years.

## 2021-01-25 ENCOUNTER — Other Ambulatory Visit: Payer: Self-pay | Admitting: Family Medicine

## 2021-01-25 DIAGNOSIS — E785 Hyperlipidemia, unspecified: Secondary | ICD-10-CM

## 2021-01-25 MED FILL — ATORVASTATIN CALCIUM 10 MG: 10 | 90 days supply | Qty: 90 | Fill #0

## 2021-01-27 MED FILL — LOSARTAN POTASSIUM 100 MG T: 100 | 30 days supply | Qty: 30 | Fill #4

## 2021-02-01 ENCOUNTER — Encounter: Payer: No Typology Code available for payment source | Attending: General Surgery | Admitting: Skilled Nursing Facility1

## 2021-02-01 ENCOUNTER — Encounter: Payer: Self-pay | Admitting: Skilled Nursing Facility1

## 2021-02-01 ENCOUNTER — Other Ambulatory Visit: Payer: Self-pay

## 2021-02-01 DIAGNOSIS — E669 Obesity, unspecified: Secondary | ICD-10-CM | POA: Insufficient documentation

## 2021-02-01 NOTE — Progress Notes (Signed)
[facility]  Planned surgery: Sleeve Gastrectomy or RYGB Pt expectation of surgery: to lose weight Pt expectation of dietitian: Guidance on what to eat  Pt completed visits.  Pt has cleared nutrition requirements.    NUTRITION ASSESSMENT   Anthropometrics  Start weight at [facility]: 298.4 lbs (date: 12/02/2020)  Weight: 303.1 Height: 64 in BMI: 52.03 kg/m2     Clinical  Medical hx: HTN Medications: see list Labs:  Notable signs/symptoms:  Any previous deficiencies? No   Lifestyle & Dietary Hx  Pt admits to second guessing weather she should have the surgery or not due to how hard making changes have been. Pt states she wants to continue on the pathway without further appointments stating she feels having the surgery will motivate her to make changes.   Pt states she will be successful with: unsure with what  Pt states she feels she will struggle with advancing her meals after surgery due to the limitations (size of pouch) of the surgery.   24-Hr Dietary Recall First Meal 7:15-7:30: granola + oatmeal + crunchy peanut butter Snack:  Second Meal: 9:30pm: baked chicken or meat loaf or lasagna Snack: 2 fruit cups throughout the night Third Meal:  Snack:  Beverages: soda, sweet tea, water   Estimated Energy Needs Calories: 1500   NUTRITION DIAGNOSIS  Overweight/obesity ([place]-3.3) related to past poor dietary habits and physical inactivity as evidenced by patient w/ planned sleeve or RYGB surgery following dietary guidelines for continued weight loss.   NUTRITION INTERVENTION  Nutrition counseling (C-1) and education (E-2) to facilitate bariatric surgery goals.  Pre-Op Goals Progress & New Goals Continued Per hour your goal is to drink half a bottle of water: have a bottle of water in front of you as a visual cue; at home when you change out your dogs water let be a cue for you to drink half a bottle of water Continued Have a  Meal at 11-12 while the  dogs are eating: a sandwich with baby carrots maybe in the microwave OR salad: cheese, [place], onion, peppers, tomatoes, egg, [place] beans   Handouts Provided Include     Learning Style & Readiness for Change Teaching method utilized: Visual & Auditory  Demonstrated degree of understanding via: Teach Back  Readiness Level: pre contemplative  Barriers to learning/adherence to lifestyle change: lack of intrinsic motivation  RD's Notes for Next Visit . Assess pts adherence to chosen goals     MONITORING & EVALUATION Dietary intake, weekly physical activity, body weight, and pre-op goals reached at next nutrition visit.    Next Steps  Patient is to follow up at [facility] for Pre-op class Pt has completed visits. No further supervised visits required by insurance

## 2021-03-08 ENCOUNTER — Ambulatory Visit: Payer: No Typology Code available for payment source | Admitting: Skilled Nursing Facility1

## 2021-03-17 ENCOUNTER — Ambulatory Visit (INDEPENDENT_AMBULATORY_CARE_PROVIDER_SITE_OTHER): Payer: No Typology Code available for payment source | Admitting: Professional

## 2021-03-17 DIAGNOSIS — F509 Eating disorder, unspecified: Secondary | ICD-10-CM | POA: Diagnosis not present

## 2021-03-17 DIAGNOSIS — F4321 Adjustment disorder with depressed mood: Secondary | ICD-10-CM | POA: Diagnosis not present

## 2021-03-21 ENCOUNTER — Other Ambulatory Visit (HOSPITAL_COMMUNITY): Payer: Self-pay

## 2021-03-21 ENCOUNTER — Other Ambulatory Visit: Payer: Self-pay | Admitting: Family Medicine

## 2021-03-21 DIAGNOSIS — I10 Essential (primary) hypertension: Secondary | ICD-10-CM

## 2021-03-21 MED ORDER — LOSARTAN POTASSIUM 100 MG PO TABS
100.0000 mg | ORAL_TABLET | Freq: Every day | ORAL | 4 refills | Status: DC
  Filled 2021-03-21: qty 30, 30d supply, fill #0
  Filled 2021-04-22: qty 90, 90d supply, fill #1
  Filled 2021-08-09: qty 30, 30d supply, fill #2

## 2021-03-21 MED FILL — Oxybutynin Chloride Tab 5 MG: ORAL | 67 days supply | Qty: 270 | Fill #0 | Status: AC

## 2021-03-21 MED FILL — Atorvastatin Calcium Tab 10 MG (Base Equivalent): ORAL | 90 days supply | Qty: 90 | Fill #0 | Status: CN

## 2021-03-23 ENCOUNTER — Other Ambulatory Visit: Payer: Self-pay

## 2021-03-23 ENCOUNTER — Encounter: Payer: No Typology Code available for payment source | Attending: General Surgery | Admitting: Skilled Nursing Facility1

## 2021-03-23 DIAGNOSIS — E669 Obesity, unspecified: Secondary | ICD-10-CM | POA: Diagnosis present

## 2021-03-23 NOTE — Progress Notes (Signed)
[facility]  Planned surgery: Sleeve Gastrectomy or RYGB Pt expectation of surgery: to lose weight Pt expectation of dietitian: Guidance on what to eat  Pt completed visits.  Pt has cleared nutrition requirements.    NUTRITION ASSESSMENT   Anthropometrics  Start weight at [facility]: 298.4 lbs (date: 12/02/2020)  Weight: 299.9 pounds Height: 64 in BMI: 51.48 kg/m2     Clinical  Medical hx: HTN Medications: see list Labs:  Notable signs/symptoms:  Any previous deficiencies? No   Lifestyle & Dietary Hx   She has done it; she is making changes!  Pt states she did not do anything or make any changes so she is not sure why she lost 3 pounds: After further discussion dietitian helped pt to see the positive changes she has made leading to a 4 pound weight loss! Pt states she was able to cut out soda and sweet tea. Pt states she has more confidence with making changes after surgery. Pt states since doing talk therapy she is committing to her weight loss journey. Pt states since her coworker passed away she is realizing she wants to be around. Pt states she likes her therapist and feels really comfortable with her. Pt states she recognizes she lacks the motivation currently but is using commitment to get motivated.   Pt states she is working with her husband to make healthy meals and recipes. Pt states her next step is to work on drinking more water period throughout the day.  Pt states she told her family she is not buying anymore snack foods or sodas which they were not happy about it but that has not deterred her.   24-Hr Dietary Recall First Meal 7:15-7:30: granola + oatmeal + crunchy peanut butter or 1 egg + [place] sausage + cheese on english muffin Snack:  Second Meal: 9:30pm: baked chicken or meat loaf or lasagna Snack: 2 fruit cups throughout the night and appelsauce Third Meal: chicken sandwich or meatloaf or fish Snack:  Beverages: water   Estimated  Energy Needs Calories: 1500   NUTRITION DIAGNOSIS  Overweight/obesity ([place]-3.3) related to past poor dietary habits and physical inactivity as evidenced by patient w/ planned sleeve or RYGB surgery following dietary guidelines for continued weight loss.   NUTRITION INTERVENTION  Nutrition counseling (C-1) and education (E-2) to facilitate bariatric surgery goals.  Pre-Op Goals Progress & New Goals Continued Per hour your goal is to drink half a bottle of water: have a bottle of water in front of you as a visual cue; at home when you change out your dogs water let be a cue for you to drink half a bottle of water Continued Have a  Meal at 11-12 while the dogs are eating: a sandwich with baby carrots maybe in the microwave OR salad: cheese, [place], onion, peppers, tomatoes, egg, [place] beans  NEW: put a post It note on your water bottle to get you to drink throughout the day  Handouts Provided Include     Learning Style & Readiness for Change Teaching method utilized: Visual & Auditory  Demonstrated degree of understanding via: Teach Back  Readiness Level: contemplative/action Barriers to learning/adherence to lifestyle change: lack of intrinsic motivation  RD's Notes for Next Visit . Assess pts adherence to chosen goals     MONITORING & EVALUATION Dietary intake, weekly physical activity, body weight, and pre-op goals reached at next nutrition visit.    Next Steps  Patient is to follow up at [facility] for Pre-op class Pt  has completed visits. No further supervised visits required by insurance

## 2021-03-29 ENCOUNTER — Ambulatory Visit: Payer: No Typology Code available for payment source | Admitting: Professional

## 2021-03-31 ENCOUNTER — Ambulatory Visit (INDEPENDENT_AMBULATORY_CARE_PROVIDER_SITE_OTHER): Payer: No Typology Code available for payment source | Admitting: Professional

## 2021-03-31 DIAGNOSIS — F509 Eating disorder, unspecified: Secondary | ICD-10-CM | POA: Diagnosis not present

## 2021-04-15 ENCOUNTER — Other Ambulatory Visit: Payer: Self-pay

## 2021-04-18 ENCOUNTER — Encounter: Payer: No Typology Code available for payment source | Admitting: Family Medicine

## 2021-04-18 ENCOUNTER — Telehealth: Payer: Self-pay | Admitting: Family Medicine

## 2021-04-18 NOTE — Telephone Encounter (Signed)
Spoke with patient, informed her she has refills at pharmacy.

## 2021-04-18 NOTE — Telephone Encounter (Signed)
Patient was sick so couldn't enter building for her appointment.  She needs to following prescriptions filled:  Losartan Atorvastatin    [place] [facility]

## 2021-04-21 ENCOUNTER — Ambulatory Visit (INDEPENDENT_AMBULATORY_CARE_PROVIDER_SITE_OTHER): Payer: No Typology Code available for payment source | Admitting: Professional

## 2021-04-21 DIAGNOSIS — F4321 Adjustment disorder with depressed mood: Secondary | ICD-10-CM

## 2021-04-22 ENCOUNTER — Other Ambulatory Visit (HOSPITAL_COMMUNITY): Payer: Self-pay

## 2021-04-25 ENCOUNTER — Ambulatory Visit
Admission: RE | Admit: 2021-04-25 | Discharge: 2021-04-25 | Disposition: A | Discharge status: Home / Self Care | Payer: No Typology Code available for payment source | Source: Ambulatory Visit | Attending: Endocrinology | Admitting: Endocrinology

## 2021-04-25 ENCOUNTER — Encounter: Payer: Self-pay | Admitting: Endocrinology

## 2021-04-25 DIAGNOSIS — E042 Nontoxic multinodular goiter: Secondary | ICD-10-CM

## 2021-05-09 ENCOUNTER — Ambulatory Visit: Payer: No Typology Code available for payment source | Admitting: Professional

## 2021-05-13 ENCOUNTER — Telehealth: Payer: Self-pay | Admitting: Family Medicine

## 2021-05-13 NOTE — Telephone Encounter (Signed)
[place] w/[facility] is call ing in to see if Dr. Volanda Napoleon has rec'd the letter from them for the pt that is in a program with them to have bariatric surgery that was faxed over on 05/10/2021 and they are needing it back on by 05/17/2021 due to the insurance appeal.

## 2021-05-15 IMAGING — US US THYROID
1 series · 12 of 25 positions shown · non-contrast
Comparison: 06/15/2009

CLINICAL DATA: Multinodular thyroid

EXAM:
THYROID ULTRASOUND
TECHNIQUE: Ultrasound examination of the thyroid gland and adjacent soft
tissues was performed.

[Series 1: us thyroid · 0.05mm/px · 12 of 49 slices shown]
[im 3/49]
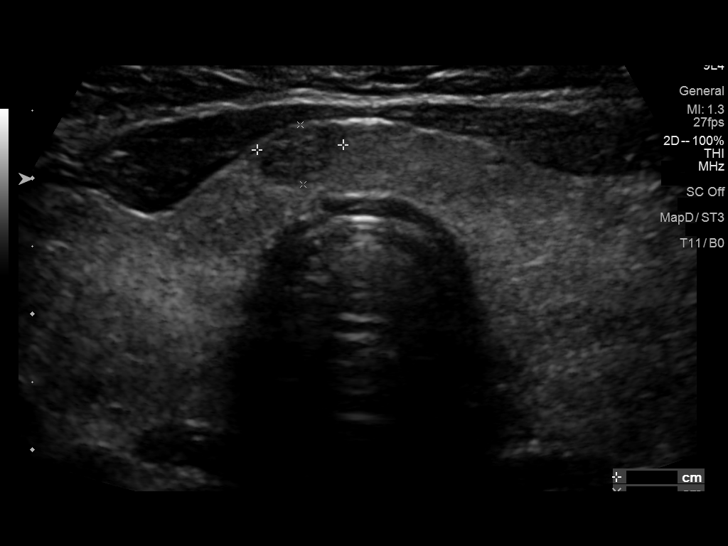
[im 7/49]
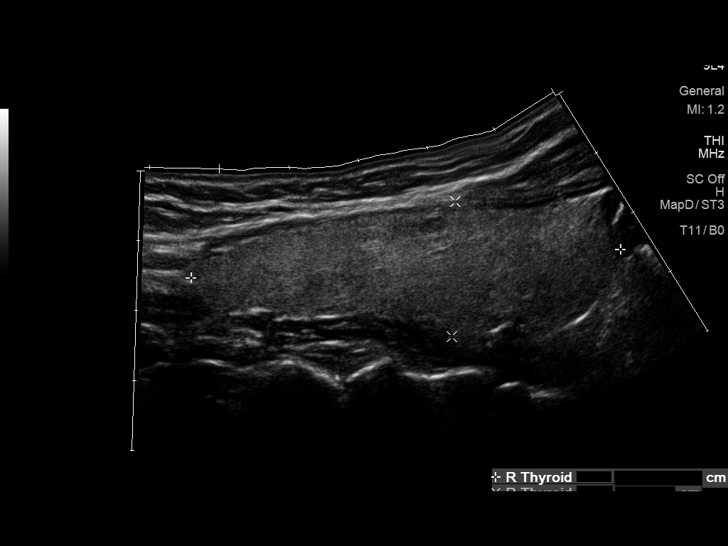
[im 11/49]
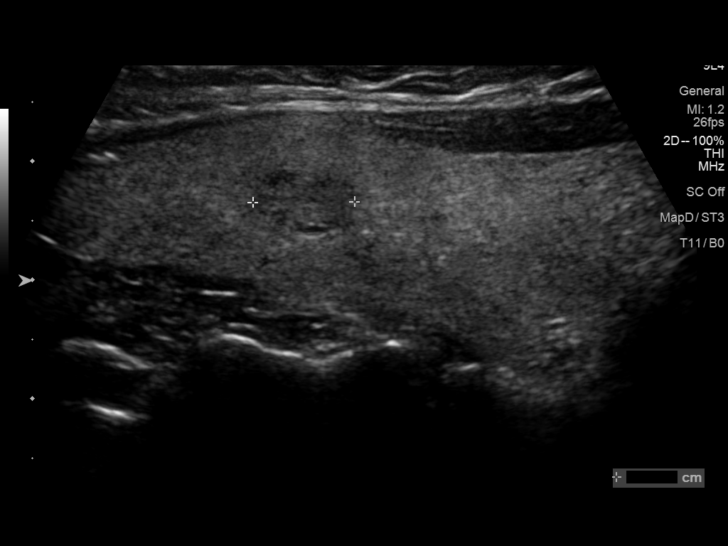
[im 15/49]
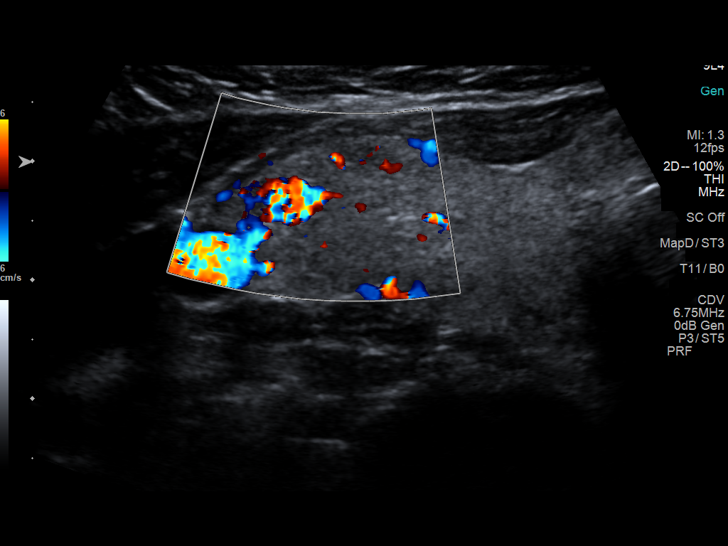
[im 19/49]
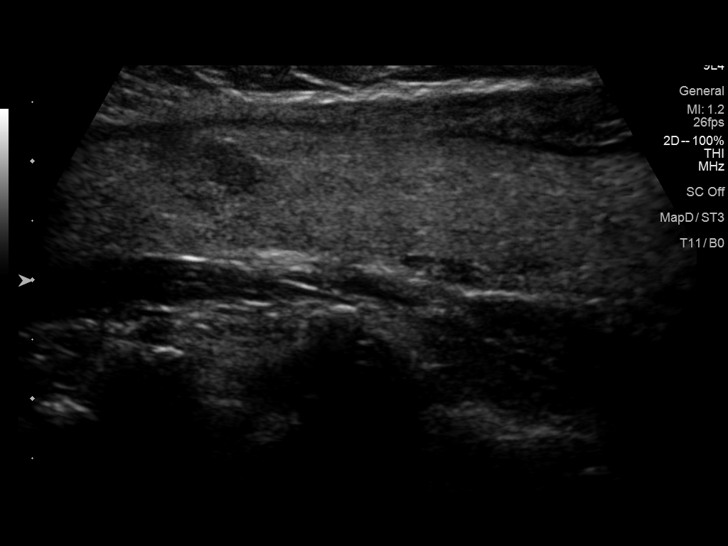
[im 23/49]
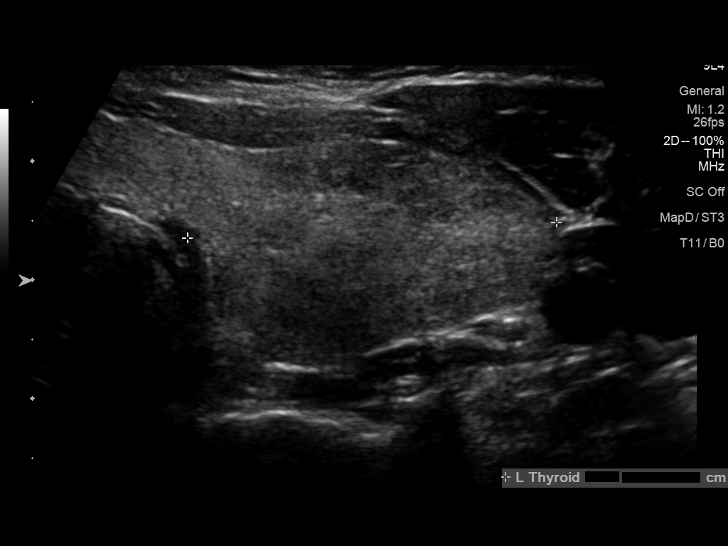
[im 27/49]
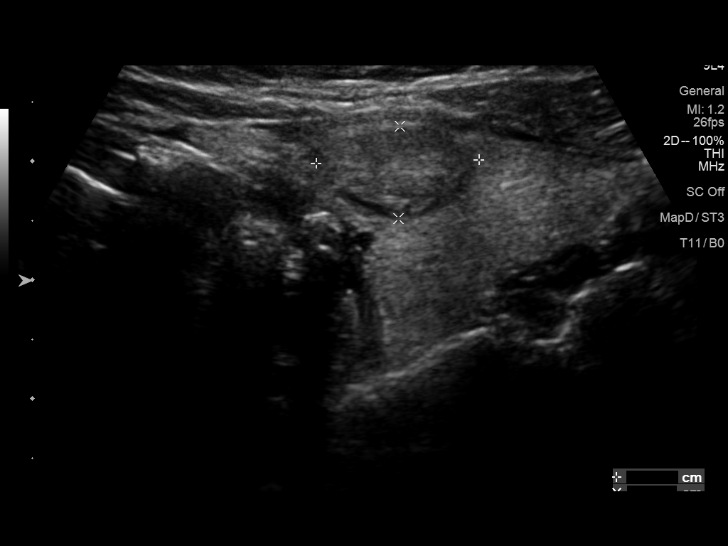
[im 31/49]
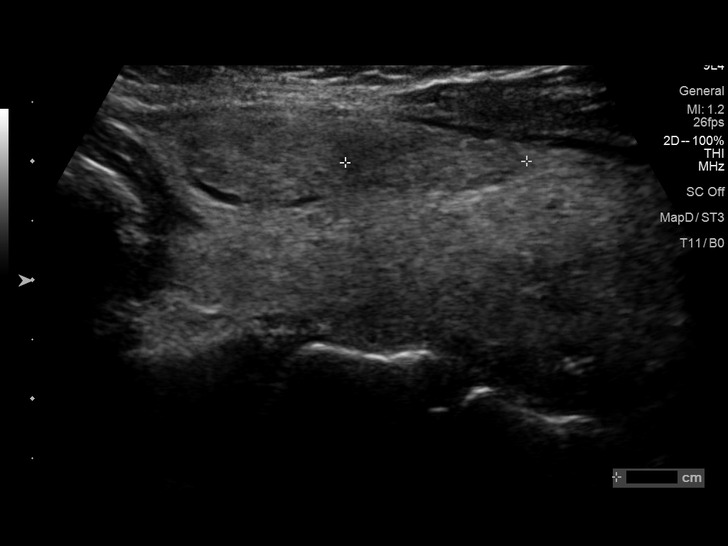
[im 35/49]
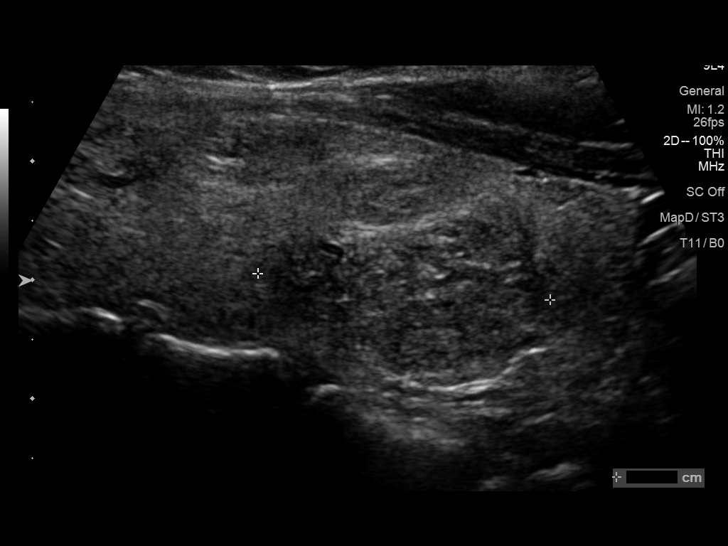
[im 39/49]
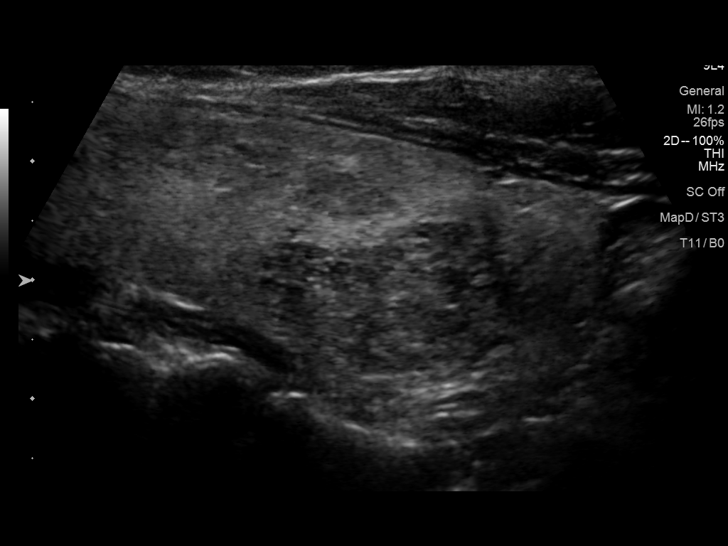
[im 43/49]
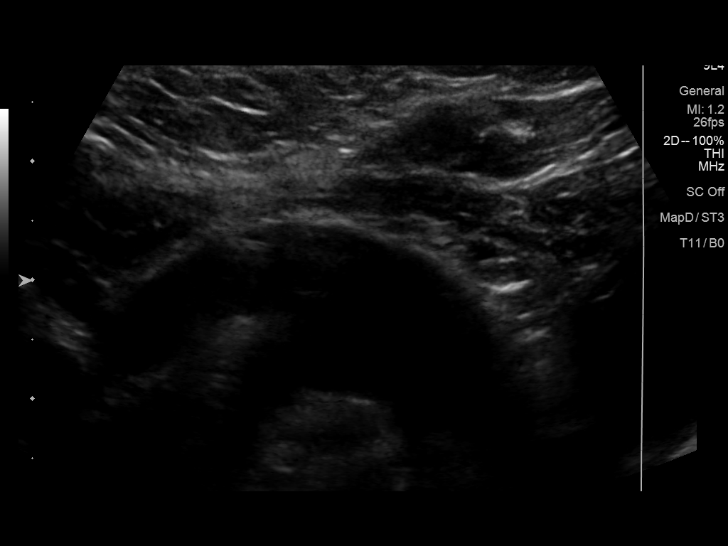
[im 47/49]
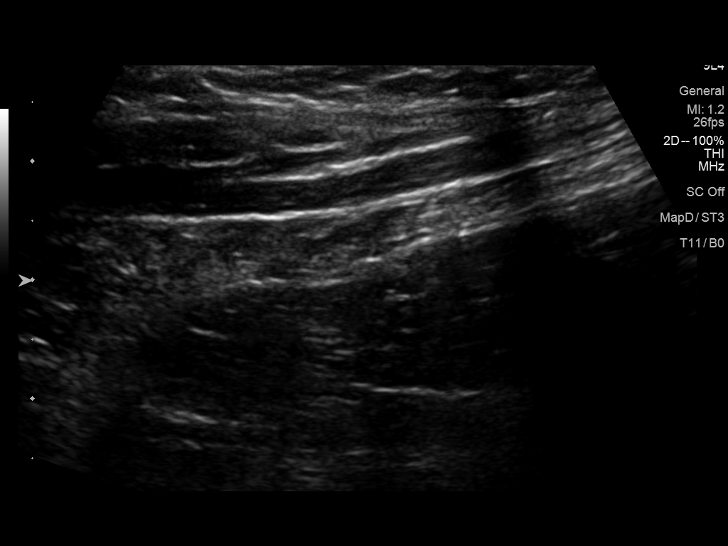

[12 of 25 positions shown; findings below may reference images not displayed]

FINDINGS: Parenchymal Echotexture: Mildly heterogenous

Isthmus: 6 mm

Right lobe: 6.2 x 1.9 x 2.9 cm

Left lobe: 7.1 x 2.4 x 3.1 cm

_________________________________________________________

Estimated total number of nodules >/= 1 cm: 4

Number of spongiform nodules >/=  2 cm not described below (TR1): 0

Number of mixed cystic and solid nodules >/= 1.5 cm not described
below (TR2): 0

_________________________________________________________

Nodule # 1:

Location: Isthmus; right

Maximum size: 1.0 cm; Other 2 dimensions: 0.6 x 0.4 cm

Composition: solid/almost completely solid (2)

Echogenicity: hypoechoic (2)

Shape: not taller-than-wide (0)

Margins: smooth (0)

Echogenic foci: none (0)

ACR TI-RADS total points: 4.

ACR TI-RADS risk category: TR4 (4-6 points).

ACR TI-RADS recommendations:

*Given size (>/= 1 - 1.4 cm) and appearance, a follow-up ultrasound
in 1 year should be considered based on TI-RADS criteria.

_________________________________________________________

Nodule # 4:

Location: Left; Mid

Maximum size: 1.5, previously 1.3 cm; Other 2 dimensions: 1.4 x
cm

Composition: solid/almost completely solid (2)

Echogenicity: isoechoic (1)

Shape: not taller-than-wide (0)

Margins: smooth (0)

Echogenic foci: none (0)

ACR TI-RADS total points: 3.

ACR TI-RADS risk category: TR3 (3 points).

ACR TI-RADS recommendations:

*Given size (>/= 1.5 - 2.4 cm) and appearance, a follow-up
ultrasound in 1 year should be considered based on TI-RADS criteria.

_________________________________________________________

Nodule # 5:

Location: Left; Mid

Maximum size: 1.5, previously 1.1 cm; Other 2 dimensions: 1.3 x
cm

Composition: solid/almost completely solid (2)

Echogenicity: isoechoic (1)

Shape: not taller-than-wide (0)

Margins: smooth (0)

Echogenic foci: none (0)

ACR TI-RADS total points: 3.

ACR TI-RADS risk category: TR3 (3 points).

ACR TI-RADS recommendations:

*Given size (>/= 1.5 - 2.4 cm) and appearance, a follow-up
ultrasound in 1 year should be considered based on TI-RADS criteria.

_________________________________________________________

Nodule # 6:

Location: Left; Inferior

Maximum size: 2.5 cm; Other 2 dimensions: 1.8 x 1.3 cm

Composition: solid/almost completely solid (2)

Echogenicity: hypoechoic (2)

Shape: not taller-than-wide (0)

Margins: ill-defined (0)

Echogenic foci: punctate echogenic foci (3)

ACR TI-RADS total points: 7.

ACR TI-RADS risk category: TR5 (>/= 7 points).

ACR TI-RADS recommendations:

**Given size (>/= 1.0 cm) and appearance, fine needle aspiration of
this highly suspicious nodule should be considered based on TI-RADS
criteria.

_________________________________________________________

No hypervascularity.  No regional adenopathy.
IMPRESSION: 2.5 cm left inferior TR 5 nodule meets criteria for biopsy as above.

Additional left thyroid TR 3 nodules and isthmus TR 4 nodule meet
criteria for follow-up in 1 year.

The above is in keeping with the ACR TI-RADS recommendations - [HOSPITAL] 2655;[DATE].

## 2021-05-16 ENCOUNTER — Other Ambulatory Visit (HOSPITAL_COMMUNITY): Payer: Self-pay

## 2021-05-16 MED FILL — Atorvastatin Calcium Tab 10 MG (Base Equivalent): ORAL | 90 days supply | Qty: 90 | Fill #0 | Status: AC

## 2021-05-20 NOTE — Telephone Encounter (Signed)
Completed paperwork was faxed on 7/19, but no fax confirmation. Faxed again on 7/20 and 7/22.

## 2021-05-23 ENCOUNTER — Ambulatory Visit: Payer: No Typology Code available for payment source | Admitting: Professional

## 2021-06-03 NOTE — Telephone Encounter (Signed)
[place] w/[facility] called again in regards to info needed for the PT to be schedule for a Bariatric Surgery. She would like a callback asap.

## 2021-06-06 ENCOUNTER — Ambulatory Visit: Payer: No Typology Code available for payment source | Admitting: Professional

## 2021-06-07 NOTE — Telephone Encounter (Signed)
Sharon Fowler returning call wanting to know if Dr. Volanda Napoleon had received fax to update information. Sharon Fowler was informed that dr. Volanda Napoleon has been out of the office.

## 2021-06-10 ENCOUNTER — Telehealth: Payer: Self-pay

## 2021-06-13 ENCOUNTER — Ambulatory Visit: Payer: No Typology Code available for payment source | Admitting: Professional

## 2021-06-17 ENCOUNTER — Telehealth: Payer: Self-pay | Admitting: Family Medicine

## 2021-06-17 NOTE — Telephone Encounter (Signed)
[place] call and stated she need the update  information  on this pt.

## 2021-06-20 ENCOUNTER — Ambulatory Visit: Payer: No Typology Code available for payment source | Admitting: Professional

## 2021-06-21 NOTE — Telephone Encounter (Signed)
Faxed paperwork that was filled out by provider.

## 2021-06-21 NOTE — Telephone Encounter (Signed)
Faxed paper work that has been filled out by provider.

## 2021-06-27 ENCOUNTER — Ambulatory Visit: Payer: No Typology Code available for payment source

## 2021-07-11 ENCOUNTER — Other Ambulatory Visit: Payer: Self-pay

## 2021-07-11 ENCOUNTER — Encounter: Payer: Self-pay | Admitting: Obstetrics and Gynecology

## 2021-07-11 ENCOUNTER — Other Ambulatory Visit (HOSPITAL_COMMUNITY): Payer: Self-pay

## 2021-07-11 ENCOUNTER — Ambulatory Visit (INDEPENDENT_AMBULATORY_CARE_PROVIDER_SITE_OTHER): Payer: No Typology Code available for payment source | Admitting: Obstetrics and Gynecology

## 2021-07-11 VITALS — BP 146/103 | HR 75 | Ht 63.0 in | Wt 300.0 lb

## 2021-07-11 DIAGNOSIS — N3281 Overactive bladder: Secondary | ICD-10-CM

## 2021-07-11 DIAGNOSIS — R350 Frequency of micturition: Secondary | ICD-10-CM | POA: Diagnosis not present

## 2021-07-11 LAB — POCT URINALYSIS DIPSTICK
Appearance: NORMAL
Bilirubin, UA: NEGATIVE
Blood, UA: NEGATIVE
Glucose, UA: NEGATIVE
Ketones, UA: NEGATIVE
Leukocytes, UA: NEGATIVE
Nitrite, UA: NEGATIVE
Protein, UA: NEGATIVE
Spec Grav, UA: 1.025 (ref 1.010–1.025)
Urobilinogen, UA: 0.2 E.U./dL
pH, UA: 5.5 (ref 5.0–8.0)

## 2021-07-11 MED ORDER — SOLIFENACIN SUCCINATE 5 MG PO TABS
5.0000 mg | ORAL_TABLET | Freq: Every day | ORAL | 0 refills | Status: DC
  Filled 2021-07-11: qty 30, 30d supply, fill #0

## 2021-07-11 NOTE — Progress Notes (Signed)
[facility] New Patient Evaluation and Consultation  Referring Provider: Billie Ruddy, MD PCP: Billie Ruddy, MD Date of Service: 07/11/2021  SUBJECTIVE Chief Complaint: New Patient (Initial Visit)- urinary leakage  History of Present Illness: Sharon Fowler is a 50 y.o. [place] or African-American female presenting for evaluation of incontinence.    Urinary Symptoms: Leaks urine with with urgency. Leakage occurs without movement.  Leaks several times per day Pad use: liners/ mini-pads  She is bothered by her UI symptoms. Bothersome while at work.  Currently on oxybutynin 20m TID- sometimes sees an improvement in frequency but overall has not had improvement.   Day time voids- 2-4 times during work.  Nocturia: 1 times per night to void. Voiding dysfunction: she empties her bladder well.  does not use a catheter to empty bladder.  When urinating, she feels dribbling after finishing Drinks: water- occasional tea or soda  UTIs:  0  UTI's in the last year.   Denies history of blood in urine and kidney or bladder stones  Pelvic Organ Prolapse Symptoms:                  She Denies a feeling of a bulge the vaginal area.   Bowel Symptom: Bowel movements: 1 time(s) per day Stool consistency: hard or soft  Straining: yes.  Splinting: no.  Incomplete evacuation: no.  She Denies accidental bowel leakage / fecal incontinence Bowel regimen: none Last colonoscopy: Negative cologuard Jan 2022  Sexual Function Sexually active: yes.  Sexual orientation:  heterosexual Pain with sex: Yes, has discomfort due to dryness  Pelvic Pain Denies pelvic pain  Past Medical History:  Past Medical History:  Diagnosis Date   ANEMIA, IRON DEFICIENCY UNSPEC 06/11/2009   corrected with iron replacement---menstrual reelated   GOITER, MULTINODULAR 07/15/2009   HYPERLIPIDEMIA 03/25/2008   HYPERTENSION 03/25/2008   OBESITY 03/25/2008     Past Surgical History:   Past Surgical  History:  Procedure Laterality Date   CESAREAN SECTION  7/91, 5/95   x2   CHOLECYSTECTOMY  10/91   laparascopy   ESOPHAGOGASTRODUODENOSCOPY (EGD) WITH PROPOFOL N/A 08/16/2017   Procedure: ESOPHAGOGASTRODUODENOSCOPY (EGD) WITH PROPOFOL;  Surgeon: NAlphonsa Overall MD;  [facility];  Service: General;  Laterality: N/A;   LMcDougal[place]STRIC BANDING  2011   TUBAL LIGATION     01/2003     Past OB/GYN History: OB History  Gravida Para Term Preterm AB Living  5 5 5     5   SAB IAB Ectopic Multiple Live Births          5    # Outcome Date GA Lbr Len/2nd Weight Sex Delivery Anes PTL Lv  5 Term      VBAC     4 Term      VBAC     3 Term      VBAC     2 Term      CS-LTranv     1 Term      CS-LTranv      Vaginal deliveries: 3,  Forceps/ Vacuum deliveries: 0, Cesarean section: 2 Menopausal: Yes since 2012 Contraception: tubal ligation. Last pap smear was 08/2020- negative.     Medications: She has a current medication list which includes the following prescription(s): atorvastatin, losartan, oxybutynin, and solifenacin.   Allergies: Patient is allergic to chlorthalidone and lisinopril.   Social History:  Social History   Tobacco Use   Smoking status: Former    Types: Cigarettes  Quit date: 01/02/1991    Years since quitting: 30.5   Smokeless tobacco: Never  Vaping Use   Vaping Use: Never used  Substance Use Topics   Alcohol use: Yes    Alcohol/week: 0.0 standard drinks    Comment: occ   Drug use: No    Relationship status: divorced She lives with daughter.   She is employed as a Garment/textile technologist. Regular exercise: No History of abuse: No  Family History:   Family History  Problem Relation Age of Onset   Diabetes Mother    Stroke Mother    Alcohol abuse Father    Stroke Father    Goiter Neg Hx      Review of Systems: Review of Systems  Constitutional:  Negative for fever, malaise/fatigue and weight loss.  Respiratory:  Positive for shortness of  breath. Negative for cough and wheezing.   Cardiovascular:  Positive for leg swelling. Negative for chest pain and palpitations.  Gastrointestinal:  Negative for abdominal pain and blood in stool.  Genitourinary:  Negative for dysuria.  Musculoskeletal:  Negative for myalgias.  Skin:  Negative for rash.  Neurological:  Negative for dizziness and headaches.  Endo/Heme/Allergies:  Bruises/bleeds easily.       + hot flashes  Psychiatric/Behavioral:  Negative for depression. The patient is not nervous/anxious.     OBJECTIVE Physical Exam: Vitals:   07/11/21 1433  BP: (!) 146/103  Pulse: 75  Weight: 300 lb (136.1 kg)  Height: 5' 3"  (1.6 m)    Physical Exam Constitutional:      General: She is not in acute distress. Pulmonary:     Effort: Pulmonary effort is normal.  Abdominal:     General: There is no distension.     Palpations: Abdomen is soft.     Tenderness: There is no abdominal tenderness. There is no rebound.  Musculoskeletal:        General: No swelling. Normal range of motion.  Skin:    General: Skin is warm and dry.     Findings: No rash.  Neurological:     Mental Status: She is alert and oriented to person, place, and time.  Psychiatric:        Mood and Affect: Mood normal.        Behavior: Behavior normal.     GU / Detailed Urogynecologic Evaluation:  Pelvic Exam: Normal external female genitalia; Bartholin's and Skene's glands normal in appearance; urethral meatus normal in appearance, no urethral masses or discharge.   CST: negative  Speculum exam reveals normal vaginal mucosa without atrophy. Cervix normal appearance. Uterus normal single, nontender. Adnexa not palpable   Pelvic floor strength II/V  Pelvic floor musculature: Right levator non-tender, Right obturator non-tender, Left levator non-tender, Left obturator non-tender  POP-Q:   POP-Q  -2                                            Aa   -2                                           Ba   -6.5  C   3                                            Gh  5                                            Pb  10                                            tvl   -2                                            Ap  -2                                            Bp  -9                                              D     Rectal Exam:  Normal external rectum  Post-Void Residual (PVR) by Bladder Scan: In order to evaluate bladder emptying, we discussed obtaining a postvoid residual and she agreed to this procedure.  Procedure: The ultrasound unit was placed on the patient's abdomen in the suprapubic region after the patient had voided. A PVR of 65 ml was obtained by bladder scan.  Laboratory Results: POC urine: negative   ASSESSMENT AND PLAN Sharon Fowler is a 50 y.o. with:  1. Overactive bladder   2. Urinary frequency    OAB We discussed the symptoms of overactive bladder (OAB), which include urinary urgency, urinary frequency, nocturia, with or without urge incontinence.  While we do not know the exact etiology of OAB, several treatment options exist. We discussed management including behavioral therapy (decreasing bladder irritants, urge suppression strategies, timed voids, bladder retraining), physical therapy, medication; for refractory cases posterior tibial nerve stimulation, sacral neuromodulation, and intravesical botulinum toxin injection.  - will start Vesicare 54m daily. If she does not see improvement, she is potentially interested in Sacral neuromodulation.   Return 6 weeks to assess for improvement. Consider urodynamic testing to better qualify leakage.     MJaquita Folds MD  Time spent: I spent 50 minutes dedicated to the care of this patient on the date of this encounter to include pre-visit review of records, face-to-face time with the patient discussing and post visit documentation

## 2021-07-18 ENCOUNTER — Ambulatory Visit: Payer: No Typology Code available for payment source

## 2021-08-09 ENCOUNTER — Other Ambulatory Visit: Payer: Self-pay | Admitting: Obstetrics and Gynecology

## 2021-08-09 ENCOUNTER — Other Ambulatory Visit (HOSPITAL_COMMUNITY): Payer: Self-pay

## 2021-08-09 DIAGNOSIS — N3281 Overactive bladder: Secondary | ICD-10-CM

## 2021-08-09 MED ORDER — SOLIFENACIN SUCCINATE 5 MG PO TABS
5.0000 mg | ORAL_TABLET | Freq: Every day | ORAL | 4 refills | Status: DC
  Filled 2021-08-09: qty 30, 30d supply, fill #0

## 2021-08-09 MED FILL — Atorvastatin Calcium Tab 10 MG (Base Equivalent): ORAL | 90 days supply | Qty: 90 | Fill #1 | Status: AC

## 2021-08-10 ENCOUNTER — Other Ambulatory Visit (HOSPITAL_COMMUNITY): Payer: Self-pay

## 2021-08-11 ENCOUNTER — Other Ambulatory Visit (HOSPITAL_COMMUNITY): Payer: Self-pay

## 2021-08-11 ENCOUNTER — Encounter: Payer: Self-pay | Admitting: Family Medicine

## 2021-08-11 ENCOUNTER — Other Ambulatory Visit: Payer: Self-pay

## 2021-08-11 ENCOUNTER — Ambulatory Visit (INDEPENDENT_AMBULATORY_CARE_PROVIDER_SITE_OTHER): Payer: No Typology Code available for payment source | Admitting: Family Medicine

## 2021-08-11 VITALS — BP 142/70 | HR 76 | Temp 97.9°F | Ht 63.0 in | Wt 299.0 lb

## 2021-08-11 DIAGNOSIS — M79669 Pain in unspecified lower leg: Secondary | ICD-10-CM | POA: Diagnosis not present

## 2021-08-11 DIAGNOSIS — I10 Essential (primary) hypertension: Secondary | ICD-10-CM

## 2021-08-11 DIAGNOSIS — E042 Nontoxic multinodular goiter: Secondary | ICD-10-CM | POA: Diagnosis not present

## 2021-08-11 DIAGNOSIS — Z0000 Encounter for general adult medical examination without abnormal findings: Secondary | ICD-10-CM

## 2021-08-11 DIAGNOSIS — E782 Mixed hyperlipidemia: Secondary | ICD-10-CM | POA: Diagnosis not present

## 2021-08-11 LAB — COMPREHENSIVE METABOLIC PANEL
ALT: 15 U/L (ref 0–35)
AST: 20 U/L (ref 0–37)
Albumin: 4.1 g/dL (ref 3.5–5.2)
Alkaline Phosphatase: 118 U/L — ABNORMAL HIGH (ref 39–117)
BUN: 10 mg/dL (ref 6–23)
CO2: 28 mEq/L (ref 19–32)
Calcium: 9.5 mg/dL (ref 8.4–10.5)
Chloride: 100 mEq/L (ref 96–112)
Creatinine, Ser: 0.64 mg/dL (ref 0.40–1.20)
GFR: 104.2 mL/min (ref >60.00)
Glucose, Bld: 92 mg/dL (ref 70–99)
Potassium: 3.5 mEq/L (ref 3.5–5.1)
Sodium: 136 mEq/L (ref 135–145)
Total Bilirubin: 0.4 mg/dL (ref 0.2–1.2)
Total Protein: 7.9 g/dL (ref 6.0–8.3)

## 2021-08-11 LAB — CBC WITH DIFFERENTIAL/PLATELET
Basophils Absolute: 0.1 10*3/uL (ref 0.0–0.1)
Basophils Relative: 0.8 % (ref 0.0–3.0)
Eosinophils Absolute: 0.2 10*3/uL (ref 0.0–0.7)
Eosinophils Relative: 1.8 % (ref 0.0–5.0)
HCT: 40.5 % (ref 36.0–46.0)
Hemoglobin: 13.3 g/dL (ref 12.0–15.0)
Lymphocytes Relative: 30.1 % (ref 12.0–46.0)
Lymphs Abs: 2.9 10*3/uL (ref 0.7–4.0)
MCHC: 32.9 g/dL (ref 30.0–36.0)
MCV: 81.9 fl (ref 78.0–100.0)
Monocytes Absolute: 0.5 10*3/uL (ref 0.1–1.0)
Monocytes Relative: 5.4 % (ref 3.0–12.0)
Neutro Abs: 5.9 10*3/uL (ref 1.4–7.7)
Neutrophils Relative %: 61.9 % (ref 43.0–77.0)
Platelets: 362 10*3/uL (ref 150.0–400.0)
RBC: 4.94 Mil/uL (ref 3.87–5.11)
RDW: 15.5 % (ref 11.5–15.5)
WBC: 9.5 10*3/uL (ref 4.0–10.5)

## 2021-08-11 LAB — LIPID PANEL
Cholesterol: 193 mg/dL (ref 0–200)
HDL: 47 mg/dL (ref >39.00)
LDL Cholesterol: 124 mg/dL — ABNORMAL HIGH (ref 0–99)
NonHDL: 146.33
Total CHOL/HDL Ratio: 4
Triglycerides: 114 mg/dL (ref 0.0–149.0)
VLDL: 22.8 mg/dL (ref 0.0–40.0)

## 2021-08-11 LAB — HEMOGLOBIN A1C: Hgb A1c MFr Bld: 6 % (ref 4.6–6.5)

## 2021-08-11 LAB — T4, FREE: Free T4: 0.89 ng/dL (ref 0.60–1.60)

## 2021-08-11 LAB — TSH: TSH: 1.77 u[IU]/mL (ref 0.35–5.50)

## 2021-08-11 MED ORDER — CARVEDILOL 3.125 MG PO TABS
3.1250 mg | ORAL_TABLET | Freq: Two times a day (BID) | ORAL | 3 refills | Status: DC
  Filled 2021-08-11: qty 60, 30d supply, fill #0

## 2021-08-11 NOTE — Progress Notes (Signed)
Subjective:     Sharon Fowler is a 50 y.o. female and is here for a comprehensive physical exam. The patient reports a sensation in the left side of her head as if she feels little "blood clots" briefly getting stuck.  Sensation does not occur all the time.  But concerns pt as her mother and father had CVAs.  Taking losartan 100 mg daily.  Patient hasn't been able to exercise.  Needs to schedule mammogram next month.  Did Cologuard in 2021 and seen by Dr. Collene Mares, GI.  Patient has a as her father suffered fatal complications during one.  Had influenza vaccine 07/17/2021.  Social History   Socioeconomic History   Marital status: Legally Separated    Spouse name: Not on file   Number of children: Not on file   Years of education: Not on file   Highest education level: Not on file  Occupational History   Not on file  Tobacco Use   Smoking status: Former    Types: Cigarettes    Quit date: 01/02/1991    Years since quitting: 30.6   Smokeless tobacco: Never  Vaping Use   Vaping Use: Never used  Substance and Sexual Activity   Alcohol use: Yes    Alcohol/week: 0.0 standard drinks    Comment: occ   Drug use: No   Sexual activity: Not Currently  Other Topics Concern   Not on file  Social History Narrative   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    She likes to sleep when she is not at work.    Social Determinants of Health   Financial Resource Strain: Not on file  Food Insecurity: Not on file  Transportation Needs: Not on file  Physical Activity: Not on file  Stress: Not on file  Social Connections: Not on file  Intimate Partner Violence: Not on file   Health Maintenance  Topic Date Due   COVID-19 Vaccine (1) Never done   HIV Screening  Never done   Hepatitis C Screening  Never done   COLONOSCOPY (Pts 45-18yr Insurance coverage will need to be confirmed)  Never done   MAMMOGRAM  09/28/2020   PAP SMEAR-Modifier   09/28/2020   TETANUS/TDAP  02/07/2024   INFLUENZA VACCINE  Completed   HPV VACCINES  Aged Out    The following portions of the patient's history were reviewed and updated as appropriate: allergies, current medications, past family history, past medical history, past social history, past surgical history, and problem list.  Review of Systems Pertinent items noted in HPI and remainder of comprehensive ROS otherwise negative.   Objective:    BP (!) 142/70 (BP Location: Left Arm, Patient Position: Sitting, Cuff Size: Normal)   Pulse 76   Temp 97.9 F (36.6 C) (Oral)   Ht 5' 3"  (1.6 m)   Wt 299 lb (135.6 kg)   LMP 05/08/2011 (Approximate)   SpO2 99%   BMI 52.97 kg/m  General appearance: alert, cooperative, and no distress Head: Normocephalic, without obvious abnormality, atraumatic Eyes: conjunctivae/corneas clear. PERRL, EOM's intact. Fundi benign. Ears: normal TM's and external ear canals both ears Nose: Nares normal. Septum midline. Mucosa normal. No drainage or sinus tenderness. Throat: lips, mucosa, and tongue normal; teeth and gums normal Neck: no adenopathy, no carotid bruit, no JVD, supple, symmetrical, trachea midline, and R lobe thyroid>L lobe Lungs: clear to auscultation bilaterally Heart: regular rate  and rhythm, S1, S2 normal, no murmur, click, rub or gallop Abdomen: soft, non-tender; bowel sounds normal; no masses,  no organomegaly Extremities: extremities normal, atraumatic, no cyanosis or edema.  Negative Homan's bilaterally.  Mild soreness to palpation of the left calf. Pulses: 2+ and symmetric Skin: Skin color, texture, turgor normal. No rashes or lesions Lymph nodes: Cervical, supraclavicular, and axillary nodes normal. Neurologic: Alert and oriented X 3, normal strength and tone. Normal symmetric reflexes. Normal coordination and gait    Assessment:    Healthy female exam with multinodular goiter. Plan:    Anticipatory guidance given including wearing  seatbelts, smoke detectors in the home, increasing physical activity, increasing p.o. intake of water and vegetables. -Obtain labs -Cologuard done 2021.  Repeat Cologuard in 3 years.  Continue follow-up with Dr. Collene Mares, GI -Patient to schedule mammogram -Discussed immunizations.  Patient had influenza vaccine 07/17/2021 -Given handout -Next CPE in 1 year See After Visit Summary for Counseling Recommendations   Essential hypertension  -Elevated -We will start Coreg 3.125 mg twice daily -Continue losartan 100 mg daily -Discussed starting ASA 81 mg daily. - Plan: CMP, carvedilol (COREG) 3.125 MG tablet  Mixed hyperlipidemia -Lifestyle modifications -Continue Lipitor 10 mg daily. -We will adjust dose if needed based on lab results - Plan: Hemoglobin A1c, Lipid panel  Nontoxic multinodular goiter  -Stable -Continue to monitor - Plan: TSH, T4, Free  Calf tenderness -Soreness of left calf with palpation on exam  - Plan: D-dimer, Quantitative  Morbid obesity (HCC) -Lifestyle modification strongly encouraged -Congratulated on 1 pound weight loss OFV 07/11/2021  F/u in 1 month  Grier Mitts, MD

## 2021-08-12 ENCOUNTER — Telehealth: Payer: Self-pay | Admitting: Family Medicine

## 2021-08-12 DIAGNOSIS — R7989 Other specified abnormal findings of blood chemistry: Secondary | ICD-10-CM

## 2021-08-12 DIAGNOSIS — R0602 Shortness of breath: Secondary | ICD-10-CM

## 2021-08-12 DIAGNOSIS — M79669 Pain in unspecified lower leg: Secondary | ICD-10-CM

## 2021-08-12 LAB — D-DIMER, QUANTITATIVE: D-Dimer, Quant: 0.73 mcg/mL FEU — ABNORMAL HIGH (ref <0.50)

## 2021-08-12 NOTE — Telephone Encounter (Signed)
Discussed elevated D-dimer results with patient.  Given shortness of breath, though noted at baseline, and LLE TTP on exam we will proceed with ultrasound and CT chest to rule out DVT and PE.  Patient given strict precautions.  For worsening symptoms proceed to nearest ED.  Grier Mitts, MD

## 2021-08-16 ENCOUNTER — Other Ambulatory Visit: Payer: Self-pay

## 2021-08-16 ENCOUNTER — Ambulatory Visit (INDEPENDENT_AMBULATORY_CARE_PROVIDER_SITE_OTHER)
Admission: RE | Admit: 2021-08-16 | Discharge: 2021-08-16 | Disposition: A | Discharge status: Home / Self Care | Payer: No Typology Code available for payment source | Source: Ambulatory Visit | Attending: Family Medicine | Admitting: Family Medicine

## 2021-08-16 DIAGNOSIS — R0602 Shortness of breath: Secondary | ICD-10-CM | POA: Diagnosis not present

## 2021-08-16 DIAGNOSIS — R7989 Other specified abnormal findings of blood chemistry: Secondary | ICD-10-CM | POA: Diagnosis not present

## 2021-08-16 MED ORDER — IOHEXOL 350 MG/ML SOLN
80.0000 mL | Freq: Once | INTRAVENOUS | Status: AC | PRN
  Administered 2021-08-16: 80 mL via INTRAVENOUS

## 2021-08-21 NOTE — Progress Notes (Signed)
[facility] Return Visit  SUBJECTIVE  History of Present Illness: Sharon Fowler is a 50 y.o. female seen in follow-up for overactive bladder. Plan at last visit was to start vesicare 56m. Previously did not see improvement with oxybutynin or Myrbetriq (had increased BP).   Still has leakage when she is at work. Voids 2-3 times at night while at work and has to change her underwear/ pad. Bladder is not full when she leaks. Has noticed increase in constipation.   Past Medical History: Patient  has a past medical history of [place]EM[place], [place]06[place]), GOITER, MULTINODULAR (07/15/2009), HYPERLIPIDEMIA (03/25/2008), HYPERTENSION (03/25/2008), and OBESITY (03/25/2008).   Past Surgical History: She  has a past surgical history that includes Laparoscopic gastric banding (2011); Cholecystectomy (10/91); Tubal ligation; Cesarean section (7/91, 5/95); and Esophagogastroduodenoscopy (egd) with propofol (N/A, 08/16/2017).   Medications: She has a current medication list which includes the following prescription(s): solifenacin, atorvastatin, carvedilol, losartan, and solifenacin.   Allergies: Patient is allergic to chlorthalidone and lisinopril.   Social History: Patient  reports that she quit smoking about 30 years ago. Her smoking use included cigarettes. She has never used smokeless tobacco. She reports current alcohol use. She reports that she does not use drugs.      OBJECTIVE     Physical Exam: Vitals:   08/22/21 0813  BP: (!) 159/103  Pulse: 73  Weight: 299 lb (135.6 kg)   Gen: No apparent distress, A&O x 3.  Detailed Urogynecologic Evaluation:  Deferred.    ASSESSMENT AND PLAN    Ms. BHigdonis a 50y.o. with:  1. Overactive bladder    - Discussed option of increasing medication dose or continuing to 3rd line therapy (she is interested in Interstim).  She wants to increase the medication dose. Vesicare 168mordered to pharmacy.  - Will have her  return in 4-6 weeks to review.   MiJaquita FoldsMD  Time spent: I spent 15 minutes dedicated to the care of this patient on the date of this encounter to include pre-visit review of records, face-to-face time with the patient and post visit documentation and ordering medication/ testing.

## 2021-08-22 ENCOUNTER — Ambulatory Visit (HOSPITAL_COMMUNITY)
Admission: RE | Admit: 2021-08-22 | Discharge: 2021-08-22 | Disposition: A | Discharge status: Home / Self Care | Payer: No Typology Code available for payment source | Source: Ambulatory Visit | Attending: Family Medicine | Admitting: Family Medicine

## 2021-08-22 ENCOUNTER — Encounter: Payer: Self-pay | Admitting: Obstetrics and Gynecology

## 2021-08-22 ENCOUNTER — Ambulatory Visit: Payer: No Typology Code available for payment source | Admitting: Obstetrics and Gynecology

## 2021-08-22 ENCOUNTER — Other Ambulatory Visit (HOSPITAL_COMMUNITY): Payer: Self-pay

## 2021-08-22 ENCOUNTER — Other Ambulatory Visit: Payer: Self-pay

## 2021-08-22 VITALS — BP 159/103 | HR 73 | Wt 299.0 lb

## 2021-08-22 DIAGNOSIS — R7989 Other specified abnormal findings of blood chemistry: Secondary | ICD-10-CM

## 2021-08-22 DIAGNOSIS — M79669 Pain in unspecified lower leg: Secondary | ICD-10-CM

## 2021-08-22 DIAGNOSIS — N3281 Overactive bladder: Secondary | ICD-10-CM | POA: Diagnosis not present

## 2021-08-22 MED ORDER — SOLIFENACIN SUCCINATE 10 MG PO TABS
10.0000 mg | ORAL_TABLET | Freq: Every day | ORAL | 5 refills | Status: DC
  Filled 2021-08-22: qty 30, 30d supply, fill #0

## 2021-08-24 ENCOUNTER — Ambulatory Visit: Payer: No Typology Code available for payment source | Admitting: Obstetrics and Gynecology

## 2021-09-12 ENCOUNTER — Ambulatory Visit (INDEPENDENT_AMBULATORY_CARE_PROVIDER_SITE_OTHER): Payer: No Typology Code available for payment source | Admitting: Family Medicine

## 2021-09-12 ENCOUNTER — Encounter: Payer: Self-pay | Admitting: Family Medicine

## 2021-09-12 ENCOUNTER — Other Ambulatory Visit: Payer: Self-pay | Admitting: Family Medicine

## 2021-09-12 ENCOUNTER — Other Ambulatory Visit (HOSPITAL_COMMUNITY): Payer: Self-pay

## 2021-09-12 VITALS — BP 122/100 | HR 72 | Temp 98.0°F | Wt 300.0 lb

## 2021-09-12 DIAGNOSIS — I10 Essential (primary) hypertension: Secondary | ICD-10-CM

## 2021-09-12 DIAGNOSIS — I517 Cardiomegaly: Secondary | ICD-10-CM

## 2021-09-12 MED ORDER — LOSARTAN POTASSIUM 100 MG PO TABS
100.0000 mg | ORAL_TABLET | Freq: Every day | ORAL | 3 refills | Status: DC
  Filled 2021-09-12: qty 90, 90d supply, fill #0
  Filled 2021-12-07: qty 90, 90d supply, fill #1
  Filled 2022-03-17: qty 90, 90d supply, fill #2
  Filled 2022-06-09: qty 90, 90d supply, fill #3

## 2021-09-12 MED ORDER — CARVEDILOL 6.25 MG PO TABS
6.2500 mg | ORAL_TABLET | Freq: Two times a day (BID) | ORAL | 3 refills | Status: DC
Start: 2021-09-12 — End: 2021-10-10
  Filled 2021-09-12: qty 60, 30d supply, fill #0

## 2021-09-12 NOTE — Progress Notes (Signed)
Subjective:    Patient ID: Sharon Fowler, female    DOB: December 28, 1970, 50 y.o.   MRN: [id number]  Chief Complaint  Patient presents with   Follow-up    BP 120/90s    HPI Patient was seen today for f/u on HTN.  Pt taking losartan 100 mg daily and coreg 3.125 mg BID.  Mild improvement in systolic BP.  Pt states diet is about the same.  Drinking mostly water, but does not like drinking much while at work 2/2 urinary incontinence.  On Vesicare.  Pt with elevated d-dimer, CTA chest and doppler b/l LEs negative.  Mild cardiomegaly noted on CTA.  Pt ready to schedule echo to follow-up.  Past Medical History:  Diagnosis Date   ANEMIA, IRON DEFICIENCY UNSPEC 06/11/2009   corrected with iron replacement---menstrual reelated   GOITER, MULTINODULAR 07/15/2009   HYPERLIPIDEMIA 03/25/2008   HYPERTENSION 03/25/2008   OBESITY 03/25/2008    Allergies  Allergen Reactions   Chlorthalidone Other (See Comments)    REACTION: hypokalemia (2.8)   Lisinopril     cough    ROS General: Denies fever, chills, night sweats, changes in weight, changes in appetite HEENT: Denies headaches, ear pain, changes in vision, rhinorrhea, sore throat CV: Denies CP, palpitations, SOB, orthopnea Pulm: Denies SOB, cough, wheezing GI: Denies abdominal pain, nausea, vomiting, diarrhea, constipation GU: Denies dysuria, hematuria, frequency, vaginal discharge Msk: Denies muscle cramps, joint pains Neuro: Denies weakness, numbness, tingling Skin: Denies rashes, bruising Psych: Denies depression, anxiety, hallucinations     Objective:    Blood pressure (!) 122/100, pulse 72, temperature 98 F (36.7 C), temperature source Oral, weight 300 lb (136.1 kg), last menstrual period 05/08/2011, SpO2 99 %.  Gen. Pleasant, well-nourished, in no distress, normal affect   HEENT: [place]/AT, face symmetric, conjunctiva clear, no scleral icterus, PERRLA, EOMI, nares patent without drainage Lungs: no accessory muscle use Cardiovascular: RRR,  no peripheral edema Neuro:  A&Ox3, CN II-XII intact, normal gait Skin:  Warm, no lesions/ rash   Wt Readings from Last 3 Encounters:  09/12/21 300 lb (136.1 kg)  08/22/21 299 lb (135.6 kg)  08/11/21 299 lb (135.6 kg)    Lab Results  Component Value Date   WBC 9.5 08/11/2021   HGB 13.3 08/11/2021   HCT 40.5 08/11/2021   PLT 362.0 08/11/2021   GLUCOSE 92 08/11/2021   CHOL 193 08/11/2021   TRIG 114.0 08/11/2021   HDL 47.00 08/11/2021   LDLDIRECT 197.8 10/15/2013   LDLCALC 124 (H) 08/11/2021   ALT 15 08/11/2021   AST 20 08/11/2021   NA 136 08/11/2021   K 3.5 08/11/2021   CL 100 08/11/2021   CREATININE 0.64 08/11/2021   BUN 10 08/11/2021   CO2 28 08/11/2021   TSH 1.77 08/11/2021   HGBA1C 6.0 08/11/2021    Assessment/Plan:  Essential hypertension  -Uncontrolled -Some improvement in systolic BP since starting Coreg -We will increase Coreg from 3.125 mg twice daily to 6.25 mg twice daily -Continue losartan 100 mg daily -Modifications including increasing physical activity -Continue to monitor BP and HR - Plan: carvedilol (COREG) 6.25 MG tablet, ECHOCARDIOGRAM COMPLETE  Morbid obesity ([place]) -Discussed lifestyle modifications -Discussed weight loss medications.  Patient to consider  Cardiomegaly -Noted on CTA chest 08/16/2021  - Plan: ECHOCARDIOGRAM COMPLETE  F/u in 4-6 weeks  Grier Mitts, MD

## 2021-09-13 ENCOUNTER — Other Ambulatory Visit (HOSPITAL_COMMUNITY): Payer: Self-pay

## 2021-10-03 ENCOUNTER — Ambulatory Visit (HOSPITAL_COMMUNITY): Payer: No Typology Code available for payment source | Attending: Cardiovascular Disease

## 2021-10-03 ENCOUNTER — Other Ambulatory Visit: Payer: Self-pay

## 2021-10-03 DIAGNOSIS — I517 Cardiomegaly: Secondary | ICD-10-CM | POA: Diagnosis present

## 2021-10-03 DIAGNOSIS — I10 Essential (primary) hypertension: Secondary | ICD-10-CM | POA: Insufficient documentation

## 2021-10-03 LAB — ECHOCARDIOGRAM COMPLETE
Area-P 1/2: 4.6 cm2
S' Lateral: 2.7 cm

## 2021-10-03 MED ORDER — PERFLUTREN LIPID MICROSPHERE
1.0000 mL | INTRAVENOUS | Status: AC | PRN
Start: 2021-10-03 — End: 2021-10-03
  Administered 2021-10-03: 2 mL via INTRAVENOUS

## 2021-10-03 NOTE — Progress Notes (Signed)
[facility] Return Visit  SUBJECTIVE  History of Present Illness: SHANEA KARNEY is a 50 y.o. female seen in follow-up for overactive bladder. Plan at last visit was to increase vesicare dose to 80m. Also previously tried oxybutynin 554mTID.   Leakage has decreased significantly. Changing pads 2-3 times per work shift, and the pad is not always wet. . Feels that she had a urinary odor. Not drinking that much water- drinking only 28oz of either water, juice or soda.  She is thinking of taking chlorophyll tablets. Also heard of revive vaginal support device and is wondering if that is something that will work for her. She denies leakage with cough/ laugh/ sneeze.   She would like to take it twice a day instead for more security.   Taking docusate daily for bowel movements. Switching this to the morning so she does not have to have  BM at night while at work. Having regular bowel movements with this.   Past Medical History: Patient  has a past medical history of [place] GOITER, MULTINODULAR (07/15/2009), HYPERLIPIDEMIA (03/25/2008), HYPERTENSION (03/25/2008), and OBESITY (03/25/2008).   Past Surgical History: She  has a past surgical history that includes Laparoscopic gastric banding (2011); Cholecystectomy (10/91); Tubal ligation; Cesarean section (7/91, 5/95); and Esophagogastroduodenoscopy (egd) with propofol (N/A, 08/16/2017).   Medications: She has a current medication list which includes the following prescription(s): solifenacin, atorvastatin, carvedilol, and losartan.   Allergies: Patient is allergic to chlorthalidone and lisinopril.   Social History: Patient  reports that she quit smoking about 30 years ago. Her smoking use included cigarettes. She has never used smokeless tobacco. She reports current alcohol use. She reports that she does not use drugs.      OBJECTIVE     Physical Exam: Vitals:   10/04/21 0818  BP: (!) 159/90   Pulse: 74   Gen: No apparent distress, A&O x 3.  Detailed Urogynecologic Evaluation:  Deferred.    ASSESSMENT AND PLAN    Ms. BlBarskys a [age].o. with:  1. Overactive bladder    - Instead of taking vesicare 1076mill change to 5mg13mice daily.  - Advised to drink more water and less soda to help with urine odor. Unsure about evidence for chlorophyll but should not interfere with other medications if she wants to try. - She denies stress incontinence so the vaginal support device is not indicated for OAB. But we discussed she can try the device if she wants to see if she has any underlying SUI that isnt recognized. A pessary would be a good option if this works for her.   Return 6 months or sooner if needed.   [name]   Time spent: I spent 20 minutes dedicated to the care of this patient on the date of this encounter to include pre-visit review of records, face-to-face time with the patient and post visit documentation and ordering medication/ testing.

## 2021-10-04 ENCOUNTER — Ambulatory Visit: Payer: No Typology Code available for payment source | Admitting: Obstetrics and Gynecology

## 2021-10-04 ENCOUNTER — Other Ambulatory Visit (HOSPITAL_COMMUNITY): Payer: Self-pay

## 2021-10-04 ENCOUNTER — Encounter: Payer: Self-pay | Admitting: Obstetrics and Gynecology

## 2021-10-04 VITALS — BP 159/90 | HR 74

## 2021-10-04 DIAGNOSIS — N3281 Overactive bladder: Secondary | ICD-10-CM | POA: Diagnosis not present

## 2021-10-04 MED ORDER — SOLIFENACIN SUCCINATE 5 MG PO TABS
5.0000 mg | ORAL_TABLET | Freq: Two times a day (BID) | ORAL | 11 refills | Status: DC
Start: 2021-10-04 — End: 2022-11-21
  Filled 2021-10-04 (×2): qty 60, 30d supply, fill #0
  Filled 2021-11-09: qty 60, 30d supply, fill #1
  Filled 2021-11-10: qty 60, 30d supply, fill #0
  Filled 2021-12-07: qty 60, 30d supply, fill #1
  Filled 2022-01-10: qty 60, 30d supply, fill #2
  Filled 2022-02-13: qty 60, 30d supply, fill #3
  Filled 2022-03-17: qty 60, 30d supply, fill #4
  Filled 2022-04-12: qty 60, 30d supply, fill #5
  Filled 2022-05-11: qty 60, 30d supply, fill #6
  Filled 2022-06-15: qty 60, 30d supply, fill #7
  Filled 2022-07-17: qty 60, 30d supply, fill #8
  Filled 2022-08-12: qty 60, 30d supply, fill #9
  Filled 2022-09-09: qty 60, 30d supply, fill #10

## 2021-10-10 ENCOUNTER — Other Ambulatory Visit (HOSPITAL_COMMUNITY): Payer: Self-pay

## 2021-10-10 ENCOUNTER — Encounter: Payer: Self-pay | Admitting: Family Medicine

## 2021-10-10 ENCOUNTER — Ambulatory Visit (INDEPENDENT_AMBULATORY_CARE_PROVIDER_SITE_OTHER): Payer: No Typology Code available for payment source | Admitting: Family Medicine

## 2021-10-10 VITALS — BP 158/100 | HR 82 | Temp 99.1°F | Wt 300.4 lb

## 2021-10-10 DIAGNOSIS — R7303 Prediabetes: Secondary | ICD-10-CM | POA: Diagnosis not present

## 2021-10-10 DIAGNOSIS — I10 Essential (primary) hypertension: Secondary | ICD-10-CM

## 2021-10-10 DIAGNOSIS — M25561 Pain in right knee: Secondary | ICD-10-CM | POA: Diagnosis not present

## 2021-10-10 DIAGNOSIS — M25562 Pain in left knee: Secondary | ICD-10-CM

## 2021-10-10 MED ORDER — CARVEDILOL 12.5 MG PO TABS
12.5000 mg | ORAL_TABLET | Freq: Two times a day (BID) | ORAL | 3 refills | Status: DC
  Filled 2021-10-10: qty 60, 30d supply, fill #0
  Filled 2021-11-09: qty 60, 30d supply, fill #1
  Filled 2021-12-07: qty 60, 30d supply, fill #2
  Filled 2022-01-10: qty 60, 30d supply, fill #3

## 2021-10-10 NOTE — Progress Notes (Signed)
Subjective:    Patient ID: Sharon Fowler, female    DOB: 1971-03-17, 50 y.o.   MRN: [id number]  Chief Complaint  Patient presents with   Follow-up    BP, weight    HPI Patient was seen today for f/u on chronic conditions.  Pt states bp was good at urogyn visit with Dr. Ruby Cola last wk.  BP was 150/90 wit P 74.  Pt has not been exercising.  States she has not been motivated and the cold weather does not help.  Endorses b/l knee pain L>R.  Staes the sensation is like a soreness inside knee, occurs at night.  Pt has to move/straighten her leg out to get relief.   Pt inquires about ECHO results-aortic valve sclerosis, Mitral valve mild calcification, and EF 55-60%.  Pt feels a thud in chest while laying down.    Past Medical History:  Diagnosis Date   ANEMIA, IRON DEFICIENCY UNSPEC 06/11/2009   corrected with iron replacement---menstrual reelated   GOITER, MULTINODULAR 07/15/2009   HYPERLIPIDEMIA 03/25/2008   HYPERTENSION 03/25/2008   OBESITY 03/25/2008    Allergies  Allergen Reactions   Chlorthalidone Other (See Comments)    REACTION: hypokalemia (2.8)   Lisinopril     cough    ROS General: Denies fever, chills, night sweats, changes in weight, changes in appetite +weight gain HEENT: Denies headaches, ear pain, changes in vision, rhinorrhea, sore throat CV: Denies CP, palpitations, SOB, orthopnea Pulm: Denies SOB, cough, wheezing GI: Denies abdominal pain, nausea, vomiting, diarrhea, constipation GU: Denies dysuria, hematuria, frequency, vaginal discharge Msk: Denies muscle cramps, joint pains +knee pain Neuro: Denies weakness, numbness, tingling Skin: Denies rashes, bruising Psych: Denies depression, anxiety, hallucinations     Objective:    Blood pressure (!) 158/100, pulse 82, temperature 99.1 F (37.3 C), temperature source Oral, weight (!) 300 lb 6.4 oz (136.3 kg), last menstrual period 05/08/2011, SpO2 96 %.  Gen. Pleasant, well-nourished, in no distress, mildly  depressed affect   HEENT: [place]/AT, face symmetric, conjunctiva clear, no scleral icterus, PERRLA, EOMI, nares patent without drainage Lungs: no accessory muscle use, CTAB, no wheezes or rales Cardiovascular: RRR, no m/r/g, no peripheral edema Musculoskeletal: Popping of right knee with flexion.  No crepitus in left knee.  No TTP of bilateral knees at joint line.  No edema.  No deformities, no cyanosis or clubbing, normal tone Neuro:  A&Ox3, CN II-XII intact, normal gait Skin:  Warm, no lesions/ rash   Wt Readings from Last 3 Encounters:  10/10/21 (!) 300 lb 6.4 oz (136.3 kg)  09/12/21 300 lb (136.1 kg)  08/22/21 299 lb (135.6 kg)    Lab Results  Component Value Date   WBC 9.5 08/11/2021   HGB 13.3 08/11/2021   HCT 40.5 08/11/2021   PLT 362.0 08/11/2021   GLUCOSE 92 08/11/2021   CHOL 193 08/11/2021   TRIG 114.0 08/11/2021   HDL 47.00 08/11/2021   LDLDIRECT 197.8 10/15/2013   LDLCALC 124 (H) 08/11/2021   ALT 15 08/11/2021   AST 20 08/11/2021   NA 136 08/11/2021   K 3.5 08/11/2021   CL 100 08/11/2021   CREATININE 0.64 08/11/2021   BUN 10 08/11/2021   CO2 28 08/11/2021   TSH 1.77 08/11/2021   HGBA1C 6.0 08/11/2021    Assessment/Plan:  Essential hypertension -Uncontrolled -Discussed importance of lifestyle modifications -We will increase Coreg from 6.25 mg twice daily to 12.5 mg daily twice daily -Continue losartan 100 mg daily and ASA 81 mg daily -Echo results reviewed:  EF 55-60%, aortic valve sclerosis without stenosis, and mitral valve with mild calcification. - Plan: carvedilol (COREG) 12.5 MG tablet  Morbid obesity (HCC) -Likely contributing to knee pain -Lifestyle modification strongly encouraged -Discussed medication options to help with weight loss including semaglutide -Continue to inquire  Prediabetes -Hemoglobin A1c 6.0% on 08/11/2021 -Consider starting Jardiance or other medication to help with weight loss and blood sugar.  Pain in both knees,  chronicity unspecified -Continue supportive care including OTC topical analgesics -Discussed the importance of weight loss -Continue to monitor  F/u in 1-2 months, sooner if needed  Grier Mitts, MD

## 2021-11-09 ENCOUNTER — Other Ambulatory Visit (HOSPITAL_COMMUNITY): Payer: Self-pay

## 2021-11-10 ENCOUNTER — Other Ambulatory Visit (HOSPITAL_COMMUNITY): Payer: Self-pay

## 2021-12-07 ENCOUNTER — Other Ambulatory Visit (HOSPITAL_COMMUNITY): Payer: Self-pay

## 2021-12-07 ENCOUNTER — Other Ambulatory Visit: Payer: Self-pay | Admitting: Family Medicine

## 2021-12-07 DIAGNOSIS — E785 Hyperlipidemia, unspecified: Secondary | ICD-10-CM

## 2021-12-07 MED ORDER — ATORVASTATIN CALCIUM 10 MG PO TABS
10.0000 mg | ORAL_TABLET | Freq: Every day | ORAL | 1 refills | Status: DC
  Filled 2021-12-07: qty 90, 90d supply, fill #0
  Filled 2022-03-17: qty 90, 90d supply, fill #1

## 2022-01-10 ENCOUNTER — Other Ambulatory Visit (HOSPITAL_COMMUNITY): Payer: Self-pay

## 2022-01-27 ENCOUNTER — Ambulatory Visit: Payer: No Typology Code available for payment source | Admitting: Endocrinology

## 2022-01-27 ENCOUNTER — Encounter: Payer: Self-pay | Admitting: Endocrinology

## 2022-01-27 VITALS — BP 138/90 | HR 69 | Ht 63.0 in | Wt 312.4 lb

## 2022-01-27 DIAGNOSIS — E042 Nontoxic multinodular goiter: Secondary | ICD-10-CM

## 2022-01-27 NOTE — Progress Notes (Signed)
? ?Subjective:  ? ? Patient ID: Sharon Fowler, female    DOB: 02-06-1971, 51 y.o.   MRN: [id number] ? ?HPI ?Pt returns for f/u of MNG (dx'ed 2005; 2.5 cm LLP nodule bx in 2021 was cat 2; TSH is normal off rx; 2022 [place] was unchanged).  Pt says thyroid is not changed in size since last ov.  ? ?Past Medical History:  ?Diagnosis Date  ? ANEMIA, IRON DEFICIENCY UNSPEC 06/11/2009  ? corrected with iron replacement---menstrual reelated  ? GOITER, MULTINODULAR 07/15/2009  ? HYPERLIPIDEMIA 03/25/2008  ? HYPERTENSION 03/25/2008  ? OBESITY 03/25/2008  ? ? ?Past Surgical History:  ?Procedure Laterality Date  ? CESAREAN SECTION  7/91, 5/95  ? x2  ? CHOLECYSTECTOMY  10/91  ? laparascopy  ? ESOPHAGOGASTRODUODENOSCOPY (EGD) WITH PROPOFOL N/A 08/16/2017  ? Procedure: ESOPHAGOGASTRODUODENOSCOPY (EGD) WITH PROPOFOL;  Surgeon: Alphonsa Overall, MD;  Location: [facility];  Service: General;  Laterality: N/A;  ? [place]  2011  ? TUBAL LIGATION    ? 01/2003  ? ? ?Social History  ? ?Socioeconomic History  ? Marital status: Legally Separated  ?  Spouse name: Not on file  ? Number of children: Not on file  ? Years of education: Not on file  ? Highest education level: Associate degree: occupational, Hotel manager, or vocational program  ?Occupational History  ? Not on file  ?Tobacco Use  ? Smoking status: Former  ?  Types: Cigarettes  ?  Quit date: 01/02/1991  ?  Years since quitting: 31.0  ? Smokeless tobacco: Never  ?Vaping Use  ? Vaping Use: Never used  ?Substance and Sexual Activity  ? Alcohol use: Yes  ?  Alcohol/week: 0.0 standard drinks  ?  Comment: occ  ? Drug use: No  ? Sexual activity: Not Currently  ?Other Topics Concern  ? Not on file  ?Social History Narrative  ? 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 Married for 2 years   ? 5 children and a step daughter.   ? She likes to sleep when she is not at work.   ? ?Social Determinants of Health  ? ?Financial Resource Strain: Low Risk   ? Difficulty of Paying Living Expenses:  Not very hard  ?Food Insecurity: Food Insecurity Present  ? Worried About Charity fundraiser in the Last Year: Sometimes true  ? Ran Out of Food in the Last Year: Sometimes true  ?Transportation Needs: No Transportation Needs  ? Lack of Transportation (Medical): No  ? Lack of Transportation (Non-Medical): No  ?Physical Activity: Unknown  ? Days of Exercise per Week: 0 days  ? Minutes of Exercise per Session: Not on file  ?Stress: No Stress Concern Present  ? Feeling of Stress : Only a little  ?Social Connections: Socially Isolated  ? Frequency of Communication with Friends and Family: Once a week  ? Frequency of Social Gatherings with Friends and Family: Once a week  ? Attends Religious Services: Never  ? Active Member of Clubs or Organizations: No  ? Attends Archivist Meetings: Not on file  ? Marital Status: Divorced  ?Intimate Partner Violence: Not on file  ? ? ?Current Outpatient Medications on File Prior to Visit  ?Medication Sig Dispense Refill  ? aspirin 81 MG EC tablet Take 81 mg by mouth daily. Swallow whole.    ? atorvastatin (LIPITOR) 10 MG tablet Take 1 tablet (10 mg total) by mouth daily. 90 tablet 1  ? carvedilol (COREG) 12.5 MG  tablet Take 1 tablet (12.5 mg total) by mouth 2 (two) times daily with a meal. 60 tablet 3  ? Cetirizine HCl (ZYRTEC PO) Take by mouth.    ? Cholecalciferol (VITAMIN D3 PO) Take by mouth.    ? losartan (COZAAR) 100 MG tablet Take 1 tablet (100 mg total) by mouth daily. 90 tablet 3  ? Sennosides-Docusate Sodium (SENNA S PO) Take by mouth.    ? solifenacin (VESICARE) 5 MG tablet Take 1 tablet (5 mg total) by mouth in the morning and at bedtime. 60 tablet 11  ? ?No current facility-administered medications on file prior to visit.  ? ? ?Allergies  ?Allergen Reactions  ? Chlorthalidone Other (See Comments)  ?  REACTION: hypokalemia (2.8)  ? Lisinopril   ?  cough  ? ? ?Family History  ?Problem Relation Age of Onset  ? Diabetes Mother   ? Stroke Mother   ? Alcohol abuse  Father   ? Stroke Father   ? Goiter Neg Hx   ? ? ?BP 138/90 (BP Location: Left Arm, Patient Position: Sitting, Cuff Size: Normal)   Pulse 69   Ht 5' 3"  (1.6 m)   Wt (!) 312 lb 6.4 oz (141.7 kg)   LMP 05/08/2011 (Approximate)   SpO2 100%   BMI 55.34 kg/m?  ? ? ?Review of Systems ? ?   ?Objective:  ? Physical Exam ?VITAL SIGNS:  See vs page.   ?GENERAL: no distress.   ?NECK: thyroid is twice normal size, with MNG surface (R>L).   ? ? ?Lab Results  ?Component Value Date  ? TSH 1.77 08/11/2021  ? ?   ?Assessment & Plan:  ?MNG, due for recheck.  Euthyroid.  No medication is needed. ? ?Patient Instructions  ?Let's recheck the ultrasound.  you will receive a phone call, about a day and time for an appointment.   ?Please have a follow-up appointment in 1 year.   ?most of the time, a "lumpy thyroid" will eventually become overactive.  this is usually a slow process, happening over the span of many years.   ? ? ? ?

## 2022-01-27 NOTE — Patient Instructions (Addendum)
Let's recheck the ultrasound.  you will receive a phone call, about a day and time for an appointment.   ?Please have a follow-up appointment in 1 year.   ?most of the time, a "lumpy thyroid" will eventually become overactive.  this is usually a slow process, happening over the span of many years.   ? ?

## 2022-02-13 ENCOUNTER — Other Ambulatory Visit (HOSPITAL_COMMUNITY): Payer: Self-pay

## 2022-02-13 ENCOUNTER — Other Ambulatory Visit: Payer: Self-pay | Admitting: Family Medicine

## 2022-02-13 DIAGNOSIS — I10 Essential (primary) hypertension: Secondary | ICD-10-CM

## 2022-02-13 MED ORDER — CARVEDILOL 12.5 MG PO TABS
12.5000 mg | ORAL_TABLET | Freq: Two times a day (BID) | ORAL | 3 refills | Status: DC
  Filled 2022-02-13: qty 60, 30d supply, fill #0
  Filled 2022-03-17: qty 60, 30d supply, fill #1
  Filled 2022-04-12: qty 60, 30d supply, fill #2
  Filled 2022-05-11: qty 60, 30d supply, fill #3

## 2022-03-15 DIAGNOSIS — Z0289 Encounter for other administrative examinations: Secondary | ICD-10-CM

## 2022-03-17 ENCOUNTER — Other Ambulatory Visit (HOSPITAL_COMMUNITY): Payer: Self-pay

## 2022-04-05 ENCOUNTER — Ambulatory Visit (INDEPENDENT_AMBULATORY_CARE_PROVIDER_SITE_OTHER): Payer: No Typology Code available for payment source | Admitting: Obstetrics and Gynecology

## 2022-04-05 ENCOUNTER — Encounter: Payer: Self-pay | Admitting: Obstetrics and Gynecology

## 2022-04-05 VITALS — BP 176/99 | HR 78

## 2022-04-05 DIAGNOSIS — N3281 Overactive bladder: Secondary | ICD-10-CM

## 2022-04-05 NOTE — Progress Notes (Signed)
[facility] Return Visit  SUBJECTIVE  History of Present Illness: Sharon Fowler is a 51 y.o. female seen in follow-up for overactive bladder. She has been on vesicare 4m BID. Also previously tried oxybutynin 514mTID.   Vesicare has been working very well. She is overall going to the bathroom less. Leakage is better but still happening. She is overall pretty happy with the vesicare. Only drinking water, cranberry juice. No longer drinking soda.   Still has constipation, taking stool softener. Denies dry mouth/ dry eyes.   Past Medical History: Patient  has a past medical history of [place] GOITER, MULTINODULAR (07/15/2009), HYPERLIPIDEMIA (03/25/2008), HYPERTENSION (03/25/2008), and OBESITY (03/25/2008).   Past Surgical History: She  has a past surgical history that includes Laparoscopic gastric banding (2011); Cholecystectomy (10/91); Tubal ligation; Cesarean section (7/91, 5/95); and Esophagogastroduodenoscopy (egd) with propofol (N/A, 08/16/2017).   Medications: She has a current medication list which includes the following prescription(s): aspirin ec, atorvastatin, carvedilol, cetirizine hcl, cholecalciferol, losartan, sennosides-docusate sodium, and solifenacin.   Allergies: Patient is allergic to chlorthalidone and lisinopril.   Social History: Patient  reports that she quit smoking about 31 years ago. Her smoking use included cigarettes. She has never used smokeless tobacco. She reports current alcohol use. She reports that she does not use drugs.      OBJECTIVE     Physical Exam: Vitals:   04/05/22 0819  BP: (!) 176/99  Pulse: 78    Gen: No apparent distress, A&O x 3.  Detailed Urogynecologic Evaluation:  Deferred.    ASSESSMENT AND PLAN    [name] a [age].o. with:  1. Overactive bladder     - Continue vesicare 60m61mwice daily. Discussed potentially increasing to 42m40mD but she is concerned about increasing  constipation.  - Also recommended pelvic PT to help her gain better control of her leakage symptoms. Referral placed.   Follow up 1 year or sooner if needed  [name]   Time spent: I spent 20 minutes dedicated to the care of this patient on the date of this encounter to include pre-visit review of records, face-to-face time with the patient and post visit documentation and ordering medication/ testing.

## 2022-04-05 NOTE — Patient Instructions (Signed)
Continue with Vesicare 38m twice a day.    Referral has been placed to pelvic physical therapy.

## 2022-04-12 ENCOUNTER — Other Ambulatory Visit (HOSPITAL_COMMUNITY): Payer: Self-pay

## 2022-04-26 ENCOUNTER — Ambulatory Visit
Admission: RE | Admit: 2022-04-26 | Discharge: 2022-04-26 | Disposition: A | Discharge status: Home / Self Care | Payer: No Typology Code available for payment source | Source: Ambulatory Visit | Attending: Endocrinology | Admitting: Endocrinology

## 2022-04-26 DIAGNOSIS — E042 Nontoxic multinodular goiter: Secondary | ICD-10-CM

## 2022-05-03 ENCOUNTER — Ambulatory Visit (INDEPENDENT_AMBULATORY_CARE_PROVIDER_SITE_OTHER): Payer: No Typology Code available for payment source | Admitting: Family Medicine

## 2022-05-03 ENCOUNTER — Encounter (INDEPENDENT_AMBULATORY_CARE_PROVIDER_SITE_OTHER): Payer: Self-pay | Admitting: Family Medicine

## 2022-05-03 VITALS — BP 158/99 | HR 71 | Temp 98.0°F | Ht 64.0 in | Wt 312.0 lb

## 2022-05-03 DIAGNOSIS — R5383 Other fatigue: Secondary | ICD-10-CM

## 2022-05-03 DIAGNOSIS — D508 Other iron deficiency anemias: Secondary | ICD-10-CM

## 2022-05-03 DIAGNOSIS — Z1331 Encounter for screening for depression: Secondary | ICD-10-CM

## 2022-05-03 DIAGNOSIS — R7303 Prediabetes: Secondary | ICD-10-CM

## 2022-05-03 DIAGNOSIS — I10 Essential (primary) hypertension: Secondary | ICD-10-CM

## 2022-05-03 DIAGNOSIS — Z6841 Body Mass Index (BMI) 40.0 and over, adult: Secondary | ICD-10-CM

## 2022-05-03 DIAGNOSIS — R0602 Shortness of breath: Secondary | ICD-10-CM

## 2022-05-03 DIAGNOSIS — E7849 Other hyperlipidemia: Secondary | ICD-10-CM | POA: Diagnosis not present

## 2022-05-04 LAB — COMPREHENSIVE METABOLIC PANEL
ALT: 18 IU/L (ref 0–32)
AST: 23 IU/L (ref 0–40)
Albumin/Globulin Ratio: 1.4 (ref 1.2–2.2)
Albumin: 4.2 g/dL (ref 3.8–4.8)
Alkaline Phosphatase: 114 IU/L (ref 44–121)
BUN/Creatinine Ratio: 13 (ref 9–23)
BUN: 10 mg/dL (ref 6–24)
Bilirubin Total: 0.4 mg/dL (ref 0.0–1.2)
CO2: 24 mmol/L (ref 20–29)
Calcium: 9.6 mg/dL (ref 8.7–10.2)
Chloride: 100 mmol/L (ref 96–106)
Creatinine, Ser: 0.76 mg/dL (ref 0.57–1.00)
Globulin, Total: 3 g/dL (ref 1.5–4.5)
Glucose: 75 mg/dL (ref 70–99)
Potassium: 3.9 mmol/L (ref 3.5–5.2)
Sodium: 139 mmol/L (ref 134–144)
Total Protein: 7.2 g/dL (ref 6.0–8.5)
eGFR: 96 mL/min/{1.73_m2} (ref >59)

## 2022-05-04 LAB — CBC WITH DIFFERENTIAL/PLATELET
Basophils Absolute: 0 10*3/uL (ref 0.0–0.2)
Basos: 0 %
EOS (ABSOLUTE): 0.2 10*3/uL (ref 0.0–0.4)
Eos: 2 %
Hematocrit: 40.5 % (ref 34.0–46.6)
Hemoglobin: 13.2 g/dL (ref 11.1–15.9)
Immature Grans (Abs): 0 10*3/uL (ref 0.0–0.1)
Immature Granulocytes: 0 %
Lymphocytes Absolute: 3 10*3/uL (ref 0.7–3.1)
Lymphs: 30 %
MCH: 27 pg (ref 26.6–33.0)
MCHC: 32.6 g/dL (ref 31.5–35.7)
MCV: 83 fL (ref 79–97)
Monocytes Absolute: 0.6 10*3/uL (ref 0.1–0.9)
Monocytes: 6 %
Neutrophils Absolute: 6.1 10*3/uL (ref 1.4–7.0)
Neutrophils: 62 %
Platelets: 344 10*3/uL (ref 150–450)
RBC: 4.88 x10E6/uL (ref 3.77–5.28)
RDW: 14.3 % (ref 11.7–15.4)
WBC: 9.8 10*3/uL (ref 3.4–10.8)

## 2022-05-04 LAB — LIPID PANEL WITH LDL/HDL RATIO
Cholesterol, Total: 196 mg/dL (ref 100–199)
HDL: 49 mg/dL (ref >39)
LDL Chol Calc (NIH): 128 mg/dL — ABNORMAL HIGH (ref 0–99)
LDL/HDL Ratio: 2.6 ratio (ref 0.0–3.2)
Triglycerides: 106 mg/dL (ref 0–149)
VLDL Cholesterol Cal: 19 mg/dL (ref 5–40)

## 2022-05-04 LAB — FOLATE: Folate: 5.7 ng/mL (ref >3.0)

## 2022-05-04 LAB — T4, FREE: Free T4: 1.4 ng/dL (ref 0.82–1.77)

## 2022-05-04 LAB — HEMOGLOBIN A1C
Est. average glucose Bld gHb Est-mCnc: 123 mg/dL
Hgb A1c MFr Bld: 5.9 % — ABNORMAL HIGH (ref 4.8–5.6)

## 2022-05-04 LAB — VITAMIN D 25 HYDROXY (VIT D DEFICIENCY, FRACTURES): Vit D, 25-Hydroxy: 36.3 ng/mL (ref 30.0–100.0)

## 2022-05-04 LAB — VITAMIN B12: Vitamin B-12: 518 pg/mL (ref 232–1245)

## 2022-05-04 LAB — TSH: TSH: 1.27 u[IU]/mL (ref 0.450–4.500)

## 2022-05-04 LAB — INSULIN, RANDOM: INSULIN: 10.5 u[IU]/mL (ref 2.6–24.9)

## 2022-05-04 LAB — T3: T3, Total: 131 ng/dL (ref 71–180)

## 2022-05-04 NOTE — Progress Notes (Signed)
Chief Complaint:   Sharon Fowler (MR# [id number]) is a 51 y.o. female who presents for evaluation and treatment of obesity and related comorbidities. Current BMI is Body mass index is 53.55 kg/m. Sharon Fowler has been struggling with her weight for many years and has been unsuccessful in either losing weight, maintaining weight loss, or reaching her healthy weight goal.  Sharon Fowler previously had lap band and removal of lap band.  Lost 100 pounds with lap band.  She works as a Garment/textile technologist at [employer].  She eats out 2-3 times per week.  She does not like cooking (planning, prep, cleaning up).  She works 6 PM to 6 AM, and she wakes up and showers.  Snacks on trail mix that is homemade-Chex, peanuts, almonds, cashews, dry bananas, mango, papaya, cranberries, raisins, with or without pumpkin seeds (2 cups over the course of her shift).  May have fresh banana.  Chicken Alfredo (1 cup Posta, 1/2 cup chicken), with water, juice, or Snapple.  Sharon Fowler is currently in the action stage of change and ready to dedicate time achieving and maintaining a healthier weight. Sharon Fowler is interested in becoming our patient and working on intensive lifestyle modifications including (but not limited to) diet and exercise for weight loss.  Sharon Fowler's habits were reviewed today and are as follows: she thinks her family will eat healthier with her, her desired weight loss is 132-137 lbs, she has been heavy most of her life, she started gaining weight with each pregnancy, her heaviest weight ever was >300 pounds, she is a picky eater and doesn't like to eat healthier foods, she has significant food cravings issues, she snacks frequently in the evenings, she skips meals frequently, she is frequently drinking liquids with calories, she frequently makes poor food choices, and she frequently eats larger portions than normal.  Depression Screen Sharon Fowler's Food and Mood (modified PHQ-9) score was 9.     05/03/2022     8:01 AM  Depression screen PHQ 2/9  Decreased Interest 3  Down, Depressed, Hopeless 1  PHQ - 2 Score 4  Altered sleeping 0  Tired, decreased energy 3  Change in appetite 0  Feeling bad or failure about yourself  0  Trouble concentrating 1  Moving slowly or fidgety/restless 1  Suicidal thoughts 0  PHQ-9 Score 9  Difficult doing work/chores Not difficult at all   Subjective:   1. Other fatigue Sharon Fowler admits to daytime somnolence and admits to waking up still tired. Patient has a history of symptoms of daytime fatigue and morning fatigue. Sharon Fowler generally gets 5 or 10 hours of sleep per night, and states that she has generally restful sleep. Snoring is present. Apneic episodes are not present. Epworth Sleepiness Score is 7.  EKG-normal sinus rhythm at 76 bpm.  2. SOBOE (shortness of breath on exertion) Sharon Fowler notes increasing shortness of breath with exercising and seems to be worsening over time with weight gain. She notes getting out of breath sooner with activity than she used to. This has not gotten worse recently. Sharon Fowler denies shortness of breath at rest or orthopnea.  3. Prediabetes Sharon Fowler's last A1c was 6.0 (diagnosed approximately 4 years ago).  She is not on medications.  4. Essential hypertension Sharon Fowler was diagnosed approximately 30 years ago.  She had preeclampsia in one of her pregnancies; on losartan 100 mg and Coreg 12.5 mg twice daily.  She has allergies to chlorthalidone and lisinopril.  5. Other hyperlipidemia Sharon Fowler was diagnosed approximately  30 years ago.  She is on atorvastatin, but her last LDL was 124.  She has a significant family history of elevated cholesterol.  6. Other iron deficiency anemia Sharon Fowler's hemoglobin and hematocrit are within normal limits on her last CBC.  MCV within normal limits on her last CBC as well.  Assessment/Plan:   1. Other fatigue Sharon Fowler does feel that her weight is causing her energy to be lower than it should be. Fatigue may be  related to obesity, depression or many other causes. Labs will be ordered, and in the meanwhile, Sharon Fowler will focus on self care including making healthy food choices, increasing physical activity and focusing on stress reduction.  - EKG 12-Lead - Vitamin B12 - Folate - VITAMIN D 25 Hydroxy (Vit-D Deficiency, Fractures) - TSH - T4, free - T3  2. SOBOE (shortness of breath on exertion) Sharon Fowler does feel that she gets out of breath more easily that she used to when she exercises. Sharon Fowler's shortness of breath appears to be obesity related and exercise induced. She has agreed to work on weight loss and gradually increase exercise to treat her exercise induced shortness of breath. Will continue to monitor closely.  3. Prediabetes We will check labs today, and we will follow-up at Sharon Fowler's next appointment.  - Hemoglobin A1c - Insulin, random  4. Essential hypertension We will check labs today, and we will follow-up on Sharon Fowler's blood pressure and labs at her next appointment.  - Comprehensive metabolic panel  5. Other hyperlipidemia We will check labs today, and we will follow-up at Sharon Fowler's next appointment.  - Lipid Panel With LDL/HDL Ratio  6. Other iron deficiency anemia We will check labs today, and we will follow-up at Sharon Fowler's next appointment. - CBC with Differential/Platelet  7. Depression screening Sharon Fowler had a positive depression screening. Depression is commonly associated with obesity and often results in emotional eating behaviors. We will monitor this closely and work on CBT to help improve the non-hunger eating patterns. Referral to Psychology may be required if no improvement is seen as she continues in our clinic.  8. Class 3 severe obesity with serious comorbidity and body mass index (BMI) of 50.0 to 59.9 in adult, unspecified obesity type (HCC) Sharon Fowler is currently in the action stage of change and her goal is to continue with weight loss efforts. I recommend Sharon Fowler  begin the structured treatment plan as follows:  She has agreed to the Category 2 Plan + 100 calories.  Exercise goals: No exercise has been prescribed at this time.   Behavioral modification strategies: increasing lean protein intake, no skipping meals, and meal planning and cooking strategies.  She was informed of the importance of frequent follow-up visits to maximize her success with intensive lifestyle modifications for her multiple health conditions. She was informed we would discuss her lab results at her next visit unless there is a critical issue that needs to be addressed sooner. Sharon Fowler agreed to keep her next visit at the agreed upon time to discuss these results.  Objective:   Blood pressure (!) 158/99, pulse 71, temperature 98 F (36.7 C), height 5\' 4"  (1.626 m), weight (!) 312 lb (141.5 kg), last menstrual period 05/08/2011, SpO2 99 %. Body mass index is 53.55 kg/m.  EKG: Normal sinus rhythm, rate 76 BPM.  Indirect Calorimeter completed today shows a VO2 of 244 and a REE of 1685.  Her calculated basal metabolic rate is 07/09/2011 thus her basal metabolic rate is worse than expected.  General: Cooperative, alert,  well developed, in no acute distress. HEENT: Conjunctivae and lids unremarkable. Cardiovascular: Regular rhythm.  Lungs: Normal work of breathing. Neurologic: No focal deficits.   Lab Results  Component Value Date   CREATININE 0.76 05/03/2022   BUN 10 05/03/2022   NA 139 05/03/2022   K 3.9 05/03/2022   CL 100 05/03/2022   CO2 24 05/03/2022   Lab Results  Component Value Date   ALT 18 05/03/2022   AST 23 05/03/2022   ALKPHOS 114 05/03/2022   BILITOT 0.4 05/03/2022   Lab Results  Component Value Date   HGBA1C 5.9 (H) 05/03/2022   HGBA1C 6.0 08/11/2021   HGBA1C 5.7 04/19/2020   HGBA1C 5.7 02/14/2018   HGBA1C 5.6 08/01/2017   Lab Results  Component Value Date   INSULIN 10.5 05/03/2022   Lab Results  Component Value Date   TSH 1.270 05/03/2022    Lab Results  Component Value Date   CHOL 196 05/03/2022   HDL 49 05/03/2022   LDLCALC 128 (H) 05/03/2022   LDLDIRECT 197.8 10/15/2013   TRIG 106 05/03/2022   CHOLHDL 4 08/11/2021   Lab Results  Component Value Date   WBC 9.8 05/03/2022   HGB 13.2 05/03/2022   HCT 40.5 05/03/2022   MCV 83 05/03/2022   PLT 344 05/03/2022   Lab Results  Component Value Date   FERRITIN 65.6 11/25/2010   Attestation Statements:   Reviewed by clinician on day of visit: allergies, medications, problem list, medical history, surgical history, family history, social history, and previous encounter notes.  Time spent on visit including pre-visit chart review and post-visit charting and care was 45 minutes.   I, Burt Knack, am acting as transcriptionist for Reuben Likes, MD. This is the patient's first visit at [facility]. The patient's NEW PATIENT PACKET was reviewed at length. Included in the packet: current and past health history, medications, allergies, ROS, gynecologic history (women only), surgical history, family history, social history, weight history, weight loss surgery history (for those that have had weight loss surgery), nutritional evaluation, mood and food questionnaire, PHQ9, Epworth questionnaire, sleep habits questionnaire, patient life and health improvement goals questionnaire. These will all be scanned into the patient's chart under media.   During the visit, I independently reviewed the patient's EKG, bioimpedance scale results, and indirect calorimeter results. I used this information to tailor a meal plan for the patient that will help her to lose weight and will improve her obesity-related conditions going forward. I performed a medically necessary appropriate examination and/or evaluation. I discussed the assessment and treatment plan with the patient. The patient was provided an opportunity to ask questions and all were answered. The patient agreed with the  plan and demonstrated an understanding of the instructions. Labs were ordered at this visit and will be reviewed at the next visit unless more critical results need to be addressed immediately. Clinical information was updated and documented in the EMR.   Total Time 45 minutes.     I have reviewed the above documentation for accuracy and completeness, and I agree with the above. - Reuben Likes, MD

## 2022-05-10 ENCOUNTER — Encounter: Payer: Self-pay | Admitting: Physical Therapy

## 2022-05-10 ENCOUNTER — Other Ambulatory Visit: Payer: Self-pay

## 2022-05-10 ENCOUNTER — Ambulatory Visit
Payer: No Typology Code available for payment source | Attending: Obstetrics and Gynecology | Admitting: Physical Therapy

## 2022-05-10 DIAGNOSIS — R293 Abnormal posture: Secondary | ICD-10-CM | POA: Diagnosis not present

## 2022-05-10 DIAGNOSIS — R279 Unspecified lack of coordination: Secondary | ICD-10-CM | POA: Insufficient documentation

## 2022-05-10 DIAGNOSIS — M6281 Muscle weakness (generalized): Secondary | ICD-10-CM | POA: Insufficient documentation

## 2022-05-10 NOTE — Therapy (Signed)
OUTPATIENT PHYSICAL THERAPY FEMALE PELVIC EVALUATION   Patient Name: Sharon Fowler MRN: [id number] DOB:17-Dec-1970, 51 y.o., female Today's Date: 05/10/2022   PT End of Session - 05/10/22 0930     Visit Number 1    Date for PT Re-Evaluation 08/10/22    Authorization Type [employer]    PT Start Time 0930    PT Stop Time 1010    PT Time Calculation (min) 40 min    Activity Tolerance Patient tolerated treatment well    Behavior During Therapy [facility] for tasks assessed/performed             Past Medical History:  Diagnosis Date   ANEMIA, IRON DEFICIENCY UNSPEC 06/11/2009   corrected with iron replacement---menstrual reelated   Bilateral swelling of feet    Constipation    GOITER, MULTINODULAR 07/15/2009   HYPERLIPIDEMIA 03/25/2008   HYPERTENSION 03/25/2008   OBESITY 03/25/2008   Prediabetes    SOB (shortness of breath)    Swallowing difficulty    Past Surgical History:  Procedure Laterality Date   CESAREAN SECTION  7/91, 5/95   x2   CHOLECYSTECTOMY  07/30/1990   laparascopy   ESOPHAGOGASTRODUODENOSCOPY (EGD) WITH PROPOFOL N/A 08/16/2017   Procedure: ESOPHAGOGASTRODUODENOSCOPY (EGD) WITH PROPOFOL;  Surgeon: Ovidio Kin, MD;  Location: [facility];  Service: General;  Laterality: N/A;   LAPAROSCOPIC GASTRIC BANDING  10/30/2009   LEEP  11/2009   TUBAL LIGATION     01/2003   Patient Active Problem List   Diagnosis Date Noted   Gastric band erosion 08/20/2017   Proteinuria 05/26/2017   Non-intractable vomiting with nausea 05/26/2017   Acute left-sided thoracic back pain 05/26/2017   Flank pain 05/26/2017   GOITER, MULTINODULAR 07/15/2009   ANEMIA, IRON DEFICIENCY UNSPEC 06/11/2009   HLD (hyperlipidemia) 03/25/2008   OBESITY 03/25/2008   Essential hypertension 03/25/2008    PCP: Deeann Saint, MD  REFERRING PROVIDER: Marguerita Beards, MD  REFERRING DIAG: N32.81 (ICD-10-CM) - Overactive bladder  THERAPY DIAG:  Muscle weakness  (generalized)  Unspecified lack of coordination  Abnormal posture  Rationale for Evaluation and Treatment Rehabilitation  ONSET DATE:  ~2018  SUBJECTIVE:                                                                                                                                                                                           SUBJECTIVE STATEMENT: Pt reports she struggles with urinary incontinence, is taking Vesicare now and this helps. Pt reports she has tried to do pelvic floor exercises but feels very weak and still has leakage. Pt reports she tries to hold urine while at work for  at least 4 hours, at home she doesn't have this problem as she able to go more often.   Fluid intake: Yes: about 50 oz per day; does drink tea sometimes not often, does have juice (snapple or cranberry) daily     PAIN:  Are you having pain? No   PRECAUTIONS: None  WEIGHT BEARING RESTRICTIONS No  FALLS:  Has patient fallen in last 6 months? 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 But this is not regular. Pads: No Fiber supplement: Yes: stool softener daily; reports since starting Vesicare has had constipation.   URINATION Pain with urination: No Fully empty bladder: No Stream: Strong Urgency: Yes:   Frequency: at home every 3-4 hours, at  work 4 hours with trying to hold it and does have a little leakage with this; usually wakes from 4x.  Leakage: Urge to void and Walking to the bathroom Pads: Yes: at work changes every time she urinates and usually changes underwear as well for hygiene. Not as needed at home.   INTERCOURSE Pain with intercourse:  not active Ability to have vaginal penetration:  Yes:     PREGNANCY Vaginal deliveries 3 Tearing Yes: did have tearing with last child with many stitches per pt C-section deliveries 2 (first 2) Currently pregnant No  PROLAPSE None    OBJECTIVE:   DIAGNOSTIC FINDINGS:    COGNITION:  Overall cognitive status: Within functional limits for tasks assessed     SENSATION:  Light touch: Appears intact  Proprioception: Appears intact  MUSCLE LENGTH: Hamstring and adductors limited by 25%                POSTURE: rounded shoulders and forward head   PELVIC ALIGNMENT:  LUMBARAROM/PROM  A/PROM A/PROM  eval  Flexion Limited by 25%  Extension WFL  Right lateral flexion Limited by 25%  Left lateral flexion Limited by 25%  Right rotation WFL  Left rotation WFL   (Blank rows = not tested)  LOWER EXTREMITY ROM:  WFL  LOWER EXTREMITY MMT:  Bil hips grossly 4/5; bil knees and ankles 5/5   PALPATION:   General  no TTP                External Perineal Exam WFL, no TTP, though mild dryness at vulva noted                             Internal Pelvic Floor no TTP  Patient confirms identification and approves PT to assess internal pelvic floor and treatment Yes  PELVIC MMT:   MMT eval  Vaginal 3/5; 4s isometrics; 5 reps  Internal Anal Sphincter   External Anal Sphincter   Puborectalis   Diastasis Recti   (Blank rows = not tested)        TONE: WFL  PROLAPSE: Not noted in hooklying but limited by body  habitus   TODAY'S TREATMENT  05/10/22 EVAL Examination completed, findings reviewed, pt educated on POC, HEP,. Pt motivated to participate in PT and  agreeable to attempt recommendations.     PATIENT EDUCATION:  Education details: [id number] Person educated: Patient Education method: Explanation, Demonstration, Tactile cues, Verbal cues, and Handouts Education comprehension: verbalized understanding and returned demonstration   HOME EXERCISE PROGRAM: [id number]  ASSESSMENT:  CLINICAL IMPRESSION: Patient is a 51 y.o. female  who was seen today for physical therapy evaluation and treatment for urinary incontinence, increased frequency and urgency. Pt reports she has had these issues for years but since starting Vesicare the urgency and frequency have improved but pt still having leakage multiple times per day needing to change pads and underwear each time she urinates at work. Pt reports at home she hasn't struggle as much as she is able to go to the bathroom quickly enough and more frequently. Pt found to have decreased flexibility in spine and bil hips, decreased hip strength and core strength. Pt consented to internal vaginal assessment this date and found to have decreased strength, coordination, and endurance.  Pt benefited from cues for improved mechanics with breathing and pelvic floor coordination and with reps improved techniques and reports better understanding for home. Pt would benefit from additional PT to further address deficits.     OBJECTIVE IMPAIRMENTS decreased coordination, decreased endurance, decreased mobility, decreased strength, increased fascial restrictions, impaired flexibility, improper body mechanics, and postural dysfunction.   ACTIVITY LIMITATIONS continence  PARTICIPATION LIMITATIONS: community activity and occupation  PERSONAL FACTORS Fitness, Time since onset of injury/illness/exacerbation, and 1 comorbidity: multiple vaginal and c-section births  are also affecting patient's functional outcome.   REHAB POTENTIAL: Good  CLINICAL DECISION MAKING: Stable/uncomplicated  EVALUATION COMPLEXITY:  Low   GOALS: Goals reviewed with patient? Yes  SHORT TERM GOALS: Target date: 06/07/2022  Pt to be I with HEP.  Baseline: Goal status: INITIAL  2.  Pt will have 25% less urgency due to bladder retraining and strengthening  Baseline:  Goal status: INITIAL  3.  Pt to demonstrate at least 4/5 pelvic floor strength for improved pelvic stability and decreased strain at pelvic floor/ decrease leakage.  Baseline:  Goal status: INITIAL  4.  Pt to demonstrate improved coordination with pelvic floor and breathing mechanics with body weight squat to decrease strain at pelvic floor and decrease leakage.  Baseline:  Goal status: INITIAL    LONG TERM GOALS: Target date:  08/10/22    Pt to be I with advanced HEP.  Baseline:  Goal status: INITIAL  2.  Pt will have 50% less urgency due to bladder retraining and strengthening  Baseline:  Goal status: INITIAL  3.  Pt to demonstrate at least 4/5 pelvic floor strength  and ability to hold contraction without compensatory strategies for 8s for improved pelvic stability and decreased strain at pelvic floor/ decrease leakage.  Baseline:  Goal status: INITIAL  4.  Pt to demonstrate improved coordination with pelvic floor and breathing mechanics with 10#  squat to decrease strain at pelvic floor and decrease leakage.  Baseline:  Goal status: INITIAL  5.  Pt to report improved time between bladder voids to at least 3 hours for improved QOL with decreased urinary frequency.   Baseline:  Goal status: INITIAL   PLAN: PT FREQUENCY:  every other week   PT DURATION:  6 sessions  PLANNED INTERVENTIONS: Therapeutic exercises, Therapeutic activity, Neuromuscular re-education, Patient/Family education, Joint mobilization, Aquatic Therapy, Dry Needling, Spinal mobilization, Cryotherapy,  Moist heat, Manual lymph drainage, scar mobilization, Taping, Biofeedback, and Manual therapy  PLAN FOR NEXT SESSION: coordination of pelvic floor and breathing with  strengthening; urge drill, bladder retraining   Otelia Sergeant, PT, DPT 05/10/2309:27 AM

## 2022-05-11 ENCOUNTER — Other Ambulatory Visit (HOSPITAL_COMMUNITY): Payer: Self-pay

## 2022-05-17 ENCOUNTER — Ambulatory Visit (INDEPENDENT_AMBULATORY_CARE_PROVIDER_SITE_OTHER): Payer: No Typology Code available for payment source | Admitting: Family Medicine

## 2022-05-17 ENCOUNTER — Encounter (INDEPENDENT_AMBULATORY_CARE_PROVIDER_SITE_OTHER): Payer: Self-pay | Admitting: Family Medicine

## 2022-05-17 ENCOUNTER — Other Ambulatory Visit (HOSPITAL_COMMUNITY): Payer: Self-pay

## 2022-05-17 VITALS — BP 164/102 | HR 80 | Temp 98.3°F | Ht 64.0 in | Wt 310.0 lb

## 2022-05-17 DIAGNOSIS — E7849 Other hyperlipidemia: Secondary | ICD-10-CM

## 2022-05-17 DIAGNOSIS — E559 Vitamin D deficiency, unspecified: Secondary | ICD-10-CM | POA: Diagnosis not present

## 2022-05-17 DIAGNOSIS — I10 Essential (primary) hypertension: Secondary | ICD-10-CM | POA: Diagnosis not present

## 2022-05-17 DIAGNOSIS — R7303 Prediabetes: Secondary | ICD-10-CM

## 2022-05-17 DIAGNOSIS — E669 Obesity, unspecified: Secondary | ICD-10-CM

## 2022-05-17 DIAGNOSIS — Z6841 Body Mass Index (BMI) 40.0 and over, adult: Secondary | ICD-10-CM

## 2022-05-17 MED ORDER — VITAMIN D (ERGOCALCIFEROL) 1.25 MG (50000 UNIT) PO CAPS
50000.0000 [IU] | ORAL_CAPSULE | ORAL | 0 refills | Status: DC
  Filled 2022-05-17: qty 4, 28d supply, fill #0

## 2022-05-17 MED ORDER — AMLODIPINE BESYLATE 5 MG PO TABS
5.0000 mg | ORAL_TABLET | Freq: Every day | ORAL | 0 refills | Status: DC
  Filled 2022-05-17: qty 30, 30d supply, fill #0

## 2022-05-22 NOTE — Progress Notes (Unsigned)
Chief Complaint:   OBESITY Sharon Fowler is here to discuss her progress with her obesity treatment plan along with follow-up of her obesity related diagnoses. Sharon Fowler is on the Category 2 Plan +100 and states she is following her eating plan approximately 0% of the time. Sharon Fowler states she is exercising  0 minutes 0 times per week.  Today's visit was #: 2 Starting weight: 312 lbs Starting date: 05/03/2022 Today's weight: 310 lbs Today's date: 05/17/2022 Total lbs lost to date: 2 lbs Total lbs lost since last in-office visit: 2  Interim History: Sharon Fowler has not started meal plan yet---she was trying to eat all food that was still in her house before getting more food. Next 2 weeks she is hoping to follow meal strictly.  Subjective:   1. Essential hypertension Sharon Fowler's blood pressure is elevated again today. She is currently taking Losartan 100 mg and Coreg 12.5 mg twice a day.  2. Other hyperlipidemia Sharon Fowler's last LDL at 128, HDL at 49 and Trigly at 106. Currently taking Lipitor.  3. Vitamin D deficiency Sharon Fowler is currently taking Vit D over the counter---thinks at 2k IU/daily.  4. Prediabetes Sharon Fowler's  A1c was 5.9, insulin 10.5. A slight decrease in A1c from Oct 2022.  Assessment/Plan:   1. Essential hypertension We will Start Amlodipine 5 mg by mouth daily for 1 month with 0 refills.  -Start amLODipine (NORVASC) 5 MG tablet; Take 1 tablet (5 mg total) by mouth daily.  Dispense: 30 tablet; Refill: 0  2. Other hyperlipidemia Sharon Fowler will continue on Lipitor without any changes in dose. Repeat labs in 3 months and evaluate dose increase at that time.  3. Vitamin D deficiency We will refill Vit D 50,000 IU once a week for 1 month with 0 refills.  Refill Vitamin D, Ergocalciferol, (DRISDOL) 1.25 MG (50000 UNIT) CAPS capsule; Take 1 capsule (50,000 Units total) by mouth every 7 (seven) days.  Dispense: 4 capsule; Refill: 0  4. Prediabetes Sharon Fowler is to start Cat 2 plan; Repeat  labs in 3 months. Consider medicine if blood sugars increase of cravings increase.  5. Obesity with current BMI of 53.2 Sharon Fowler is currently in the action stage of change. As such, her goal is to continue with weight loss efforts. She has agreed to the Category 2 Plan +100.  Exercise goals: No exercise has been prescribed at this time.  Behavioral modification strategies: increasing lean protein intake, no skipping meals, meal planning and cooking strategies, keeping healthy foods in the home, and planning for success.  Sharon Fowler has agreed to follow-up with our clinic in 2 weeks. She was informed of the importance of frequent follow-up visits to maximize her success with intensive lifestyle modifications for her multiple health conditions.   Objective:   Blood pressure (!) 164/102, pulse 80, temperature 98.3 F (36.8 C), height 5\' 4"  (1.626 m), weight (!) 310 lb (140.6 kg), last menstrual period 05/08/2011, SpO2 98 %. Body mass index is 53.21 kg/m.  General: Cooperative, alert, well developed, in no acute distress. HEENT: Conjunctivae and lids unremarkable. Cardiovascular: Regular rhythm.  Lungs: Normal work of breathing. Neurologic: No focal deficits.   Lab Results  Component Value Date   CREATININE 0.76 05/03/2022   BUN 10 05/03/2022   NA 139 05/03/2022   K 3.9 05/03/2022   CL 100 05/03/2022   CO2 24 05/03/2022   Lab Results  Component Value Date   ALT 18 05/03/2022   AST 23 05/03/2022   ALKPHOS 114 05/03/2022  BILITOT 0.4 05/03/2022   Lab Results  Component Value Date   HGBA1C 5.9 (H) 05/03/2022   HGBA1C 6.0 08/11/2021   HGBA1C 5.7 04/19/2020   HGBA1C 5.7 02/14/2018   HGBA1C 5.6 08/01/2017   Lab Results  Component Value Date   INSULIN 10.5 05/03/2022   Lab Results  Component Value Date   TSH 1.270 05/03/2022   Lab Results  Component Value Date   CHOL 196 05/03/2022   HDL 49 05/03/2022   LDLCALC 128 (H) 05/03/2022   LDLDIRECT 197.8 10/15/2013   TRIG 106  05/03/2022   CHOLHDL 4 08/11/2021   Lab Results  Component Value Date   VD25OH 36.3 05/03/2022   Lab Results  Component Value Date   WBC 9.8 05/03/2022   HGB 13.2 05/03/2022   HCT 40.5 05/03/2022   MCV 83 05/03/2022   PLT 344 05/03/2022   Lab Results  Component Value Date   FERRITIN 65.6 11/25/2010   Attestation Statements:   Reviewed by clinician on day of visit: allergies, medications, problem list, medical history, surgical history, family history, social history, and previous encounter notes.  Time spent on visit including pre-visit chart review and post-visit care and charting was 45 minutes.   I, Fortino Sic, RMA am acting as transcriptionist for Reuben Likes, MD.  I have reviewed the above documentation for accuracy and completeness, and I agree with the above. - Reuben Likes, MD

## 2022-05-23 ENCOUNTER — Ambulatory Visit: Payer: No Typology Code available for payment source | Admitting: Physical Therapy

## 2022-05-30 ENCOUNTER — Other Ambulatory Visit (HOSPITAL_COMMUNITY): Payer: Self-pay

## 2022-05-30 ENCOUNTER — Ambulatory Visit (INDEPENDENT_AMBULATORY_CARE_PROVIDER_SITE_OTHER): Payer: No Typology Code available for payment source | Admitting: Family Medicine

## 2022-05-30 ENCOUNTER — Encounter (INDEPENDENT_AMBULATORY_CARE_PROVIDER_SITE_OTHER): Payer: Self-pay | Admitting: Family Medicine

## 2022-05-30 VITALS — BP 120/79 | HR 79 | Temp 98.0°F | Ht 64.0 in | Wt 314.0 lb

## 2022-05-30 DIAGNOSIS — I10 Essential (primary) hypertension: Secondary | ICD-10-CM | POA: Diagnosis not present

## 2022-05-30 DIAGNOSIS — R7303 Prediabetes: Secondary | ICD-10-CM

## 2022-05-30 DIAGNOSIS — Z6841 Body Mass Index (BMI) 40.0 and over, adult: Secondary | ICD-10-CM

## 2022-05-30 DIAGNOSIS — E669 Obesity, unspecified: Secondary | ICD-10-CM | POA: Diagnosis not present

## 2022-05-30 DIAGNOSIS — E66813 Obesity, class 3: Secondary | ICD-10-CM

## 2022-05-30 DIAGNOSIS — E559 Vitamin D deficiency, unspecified: Secondary | ICD-10-CM

## 2022-05-30 MED ORDER — VITAMIN D (ERGOCALCIFEROL) 1.25 MG (50000 UNIT) PO CAPS
50000.0000 [IU] | ORAL_CAPSULE | ORAL | 0 refills | Status: DC
  Filled 2022-05-30 – 2022-06-09 (×2): qty 4, 28d supply, fill #0

## 2022-06-07 ENCOUNTER — Ambulatory Visit: Payer: No Typology Code available for payment source | Admitting: Physical Therapy

## 2022-06-07 ENCOUNTER — Encounter (INDEPENDENT_AMBULATORY_CARE_PROVIDER_SITE_OTHER): Payer: Self-pay

## 2022-06-09 ENCOUNTER — Other Ambulatory Visit (INDEPENDENT_AMBULATORY_CARE_PROVIDER_SITE_OTHER): Payer: Self-pay | Admitting: Family Medicine

## 2022-06-09 ENCOUNTER — Other Ambulatory Visit (HOSPITAL_COMMUNITY): Payer: Self-pay

## 2022-06-09 ENCOUNTER — Other Ambulatory Visit: Payer: Self-pay | Admitting: Family Medicine

## 2022-06-09 DIAGNOSIS — E785 Hyperlipidemia, unspecified: Secondary | ICD-10-CM

## 2022-06-09 DIAGNOSIS — I10 Essential (primary) hypertension: Secondary | ICD-10-CM

## 2022-06-09 MED ORDER — CARVEDILOL 12.5 MG PO TABS
12.5000 mg | ORAL_TABLET | Freq: Two times a day (BID) | ORAL | 3 refills | Status: DC
  Filled 2022-06-09: qty 60, 30d supply, fill #0
  Filled 2022-07-17: qty 60, 30d supply, fill #1
  Filled 2022-08-12: qty 60, 30d supply, fill #2

## 2022-06-09 MED ORDER — ATORVASTATIN CALCIUM 10 MG PO TABS
10.0000 mg | ORAL_TABLET | Freq: Every day | ORAL | 1 refills | Status: DC
  Filled 2022-06-09: qty 90, 90d supply, fill #0
  Filled 2022-09-09: qty 90, 90d supply, fill #1

## 2022-06-11 NOTE — Progress Notes (Unsigned)
Chief Complaint:   OBESITY Sharon Fowler is here to discuss her progress with her obesity treatment plan along with follow-up of her obesity related diagnoses. Sharon Fowler is on the Category 2 Plan + 100 calories and states she is following her eating plan approximately 25% of the time. Sharon Fowler states she is doing 0 minutes 0 times per week.  Today's visit was #: 3 Starting weight: 312 lbs Starting date: 05/03/2022 Today's weight: 314 lbs Today's date: 05/30/2022 Total lbs lost to date: 0 Total lbs lost since last in-office visit: 0  Interim History: Sharon Fowler juggling to follow a structured meal plan.  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  She asks if we can discuss a time frame for all of her meals.  Subjective:   1. Essential hypertension Sharon Fowler is back on amlodipine and her blood pressure has improved, and is now at goal.  I discussed labs with the patient today.  2. Vitamin D deficiency Sharon Fowler vitamin D level is not yet at goal, and she notes fatigue.  I discussed labs with the patient today.  3. Pre-diabetes Sharon Fowler's A1c has slightly improved with change in diet.  She is not on metformin.  I discussed labs with the patient today.  Assessment/Plan:   1. Essential hypertension Sharon Fowler will continue her medications as is, and we will continue with her diet and weight loss to improve blood pressure control. We will watch for signs of hypotension as she continues her lifestyle modifications.  2. Vitamin D deficiency We will refill prescription Vitamin D 50,000 IU every week for 1 month. Sharon Fowler will follow-up for routine testing of Vitamin D, at least 2-3 times per year to avoid over-replacement.  - Vitamin D, Ergocalciferol, (DRISDOL) 1.25 MG (50000 UNIT) CAPS capsule; Take 1 capsule (50,000 Units total) by mouth every 7 (seven) days.  Dispense: 4 capsule; Refill: 0  3. Pre-diabetes Sharon Fowler will continue with her diet and exercise, and we will  continue to follow.  4. Obesity, Current BMI 53.9 Sharon Fowler is currently in the action stage of change. As such, her goal is to continue with weight loss efforts. She has agreed to the Category 2 Plan.   Sharon Fowler is to have breakfast by 9 PM, lunch at 1 to 2 AM, and dinner by 7 AM.  Behavioral modification strategies: decreasing simple carbohydrates, meal planning and cooking strategies, and better snacking choices.  Sharon Fowler has agreed to follow-up with our clinic in 2 weeks. She was informed of the importance of frequent follow-up visits to maximize her success with intensive lifestyle modifications for her multiple health conditions.   Objective:   Blood pressure 120/79, pulse 79, temperature 98 F (36.7 C), height 5\' 4"  (1.626 m), weight (!) 314 lb (142.4 kg), last menstrual period 05/08/2011, SpO2 100 %. Body mass index is 53.9 kg/m.  General: Cooperative, alert, well developed, in no acute distress. HEENT: Conjunctivae and lids unremarkable. Cardiovascular: Regular rhythm.  Lungs: Normal work of breathing. Neurologic: No focal deficits.   Lab Results  Component Value Date   CREATININE 0.76 05/03/2022   BUN 10 05/03/2022   NA 139 05/03/2022   K 3.9 05/03/2022   CL 100 05/03/2022   CO2 24 05/03/2022   Lab Results  Component Value Date   ALT 18 05/03/2022   AST 23 05/03/2022   ALKPHOS 114 05/03/2022   BILITOT 0.4 05/03/2022   Lab Results  Component Value Date   HGBA1C 5.9 (H) 05/03/2022  HGBA1C 6.0 08/11/2021   HGBA1C 5.7 04/19/2020   HGBA1C 5.7 02/14/2018   HGBA1C 5.6 08/01/2017   Lab Results  Component Value Date   INSULIN 10.5 05/03/2022   Lab Results  Component Value Date   TSH 1.270 05/03/2022   Lab Results  Component Value Date   CHOL 196 05/03/2022   HDL 49 05/03/2022   LDLCALC 128 (H) 05/03/2022   LDLDIRECT 197.8 10/15/2013   TRIG 106 05/03/2022   CHOLHDL 4 08/11/2021   Lab Results  Component Value Date   VD25OH 36.3 05/03/2022   Lab Results   Component Value Date   WBC 9.8 05/03/2022   HGB 13.2 05/03/2022   HCT 40.5 05/03/2022   MCV 83 05/03/2022   PLT 344 05/03/2022   Lab Results  Component Value Date   FERRITIN 65.6 11/25/2010   Attestation Statements:   Reviewed by clinician on day of visit: allergies, medications, problem list, medical history, surgical history, family history, social history, and previous encounter notes.   I, Burt Knack, am acting as transcriptionist for Quillian Quince, MD.  I have reviewed the above documentation for accuracy and completeness, and I agree with the above. -  Quillian Quince, MD

## 2022-06-12 ENCOUNTER — Other Ambulatory Visit (HOSPITAL_COMMUNITY): Payer: Self-pay

## 2022-06-13 ENCOUNTER — Encounter (INDEPENDENT_AMBULATORY_CARE_PROVIDER_SITE_OTHER): Payer: Self-pay | Admitting: Family Medicine

## 2022-06-13 ENCOUNTER — Ambulatory Visit (INDEPENDENT_AMBULATORY_CARE_PROVIDER_SITE_OTHER): Payer: No Typology Code available for payment source | Admitting: Family Medicine

## 2022-06-13 ENCOUNTER — Other Ambulatory Visit (HOSPITAL_COMMUNITY): Payer: Self-pay

## 2022-06-13 VITALS — BP 139/80 | HR 73 | Temp 98.5°F | Ht 64.0 in | Wt 313.0 lb

## 2022-06-13 DIAGNOSIS — I10 Essential (primary) hypertension: Secondary | ICD-10-CM

## 2022-06-13 DIAGNOSIS — Z9889 Other specified postprocedural states: Secondary | ICD-10-CM | POA: Diagnosis not present

## 2022-06-13 DIAGNOSIS — E559 Vitamin D deficiency, unspecified: Secondary | ICD-10-CM

## 2022-06-13 DIAGNOSIS — Z6841 Body Mass Index (BMI) 40.0 and over, adult: Secondary | ICD-10-CM

## 2022-06-13 DIAGNOSIS — R7303 Prediabetes: Secondary | ICD-10-CM | POA: Diagnosis not present

## 2022-06-13 DIAGNOSIS — E669 Obesity, unspecified: Secondary | ICD-10-CM

## 2022-06-13 MED ORDER — AMLODIPINE BESYLATE 5 MG PO TABS
5.0000 mg | ORAL_TABLET | Freq: Every day | ORAL | 0 refills | Status: DC
  Filled 2022-06-13: qty 30, 30d supply, fill #0

## 2022-06-13 MED ORDER — VITAMIN D (ERGOCALCIFEROL) 1.25 MG (50000 UNIT) PO CAPS
50000.0000 [IU] | ORAL_CAPSULE | ORAL | 0 refills | Status: DC
  Filled 2022-06-13: qty 4, 28d supply, fill #0

## 2022-06-15 ENCOUNTER — Other Ambulatory Visit (HOSPITAL_COMMUNITY): Payer: Self-pay

## 2022-06-21 NOTE — Progress Notes (Signed)
Chief Complaint:   OBESITY Sharon Fowler is here to discuss her progress with her obesity treatment plan along with follow-up of her obesity related diagnoses. Sharon Fowler is on the Category 2 Plan and states she is following her eating plan approximately 15% of the time. Sharon Fowler states she is doing 0 minutes 0 times per week.  Today's visit was #: 4 Starting weight: 312 lbs Starting date: 05/03/2022 Today's weight: 313 lbs Today's date: 06/13/2022 Total lbs lost to date: 0 Total lbs lost since last in-office visit: 1  Interim History: Sharon Fowler's net weight gain is 1 lb since her first visit 4 weeks ago. She is struggling with adherence to Category 2 meal plan. She works nights. Meal skipping and snacking a lot on trail mix. Loves to eat ranch dressing. Took phentermine years ago. Not doing any exercise.   Subjective:   1. Essential hypertension [name]'s blood pressure is well controlled on Norvasc 5 mg daily, Coreg 12.5 mg BID, and losartan 100 mg daily.   2. Prediabetes Sharon Fowler's last A1c was 5.9 on 05/03/2022 and fasting insulin 10.5. She is at risk for type II diabetes mellitus with a BMI of 53. She continues to consume excess sugar, and we discussed making changes.   3. Vitamin D deficiency Sharon Fowler's last vitamin D level was 36 on 05/13/2022. She is on prescription Vitamin D 50,000 IU once weekly, and she denies side effects.   4. S/P laparoscopic surgery, banding Sharon Fowler's band was removed due to band erosion in 2018. She declined bariatric surgery for now.   Assessment/Plan:   1. Essential hypertension Yula will continue her active plan for weight loss. We will refill Norvasc 5 mg once daily for 1 month.  - amLODipine (NORVASC) 5 MG tablet; Take 1 tablet (5 mg total) by mouth daily.  Dispense: 30 tablet; Refill: 0  2. Prediabetes Caylyn will consider the use of metformin or GLP-1 agonist.   3. Vitamin D deficiency Raina will continue prescription Vitamin D 50,000 IU once weekly, and  we will refill for 1 month. We will recheck Vit D level in 2-3 months.  - Vitamin D, Ergocalciferol, (DRISDOL) 1.25 MG (50000 UNIT) CAPS capsule; Take 1 capsule (50,000 Units total) by mouth every 7 (seven) days.  Dispense: 4 capsule; Refill: 0  4. S/P laparoscopic surgery, banding Cerria will continue to focus on medical weight management.   5. Obesity, Current BMI 53.7 Sharon Fowler is currently in the action stage of change. As such, her goal is to continue with weight loss efforts. She has agreed to the Category 2 Plan with 130 grams of protein daily.   Discussed options to log daily kcals, she declined. Additional food option handouts were given.  Exercise goals: Track steps with the goal of >3,000 steps per day.  Behavioral modification strategies: increasing lean protein intake, increasing water intake, decreasing eating out, no skipping meals, better snacking choices, keeping a strict food journal, and decreasing junk food.  Sharon Fowler has agreed to follow-up with our clinic in 3 weeks. She was informed of the importance of frequent follow-up visits to maximize her success with intensive lifestyle modifications for her multiple health conditions.   Objective:   Blood pressure 139/80, pulse 73, temperature 98.5 F (36.9 C), height 5\' 4"  (1.626 m), weight (!) 313 lb (142 kg), last menstrual period 05/08/2011, SpO2 100 %. Body mass index is 53.73 kg/m.  General: Cooperative, alert, well developed, in no acute distress. HEENT: Conjunctivae and lids unremarkable. Cardiovascular: Regular rhythm.  Lungs:  Normal work of breathing. Neurologic: No focal deficits.   Lab Results  Component Value Date   CREATININE 0.76 05/03/2022   BUN 10 05/03/2022   NA 139 05/03/2022   K 3.9 05/03/2022   CL 100 05/03/2022   CO2 24 05/03/2022   Lab Results  Component Value Date   ALT 18 05/03/2022   AST 23 05/03/2022   ALKPHOS 114 05/03/2022   BILITOT 0.4 05/03/2022   Lab Results  Component Value  Date   HGBA1C 5.9 (H) 05/03/2022   HGBA1C 6.0 08/11/2021   HGBA1C 5.7 04/19/2020   HGBA1C 5.7 02/14/2018   HGBA1C 5.6 08/01/2017   Lab Results  Component Value Date   INSULIN 10.5 05/03/2022   Lab Results  Component Value Date   TSH 1.270 05/03/2022   Lab Results  Component Value Date   CHOL 196 05/03/2022   HDL 49 05/03/2022   LDLCALC 128 (H) 05/03/2022   LDLDIRECT 197.8 10/15/2013   TRIG 106 05/03/2022   CHOLHDL 4 08/11/2021   Lab Results  Component Value Date   VD25OH 36.3 05/03/2022   Lab Results  Component Value Date   WBC 9.8 05/03/2022   HGB 13.2 05/03/2022   HCT 40.5 05/03/2022   MCV 83 05/03/2022   PLT 344 05/03/2022   Lab Results  Component Value Date   FERRITIN 65.6 11/25/2010   Attestation Statements:   Reviewed by clinician on day of visit: allergies, medications, problem list, medical history, surgical history, family history, social history, and previous encounter notes.   Trude Mcburney, am acting as transcriptionist for Seymour Bars, DO.  I have reviewed the above documentation for accuracy and completeness, and I agree with the above. Glennis Brink, DO

## 2022-06-27 ENCOUNTER — Encounter (INDEPENDENT_AMBULATORY_CARE_PROVIDER_SITE_OTHER): Payer: Self-pay | Admitting: Family Medicine

## 2022-06-27 ENCOUNTER — Ambulatory Visit (INDEPENDENT_AMBULATORY_CARE_PROVIDER_SITE_OTHER): Payer: No Typology Code available for payment source | Admitting: Family Medicine

## 2022-06-27 ENCOUNTER — Other Ambulatory Visit (HOSPITAL_COMMUNITY): Payer: Self-pay

## 2022-06-27 VITALS — BP 134/80 | HR 68 | Temp 97.9°F | Ht 64.0 in | Wt 309.0 lb

## 2022-06-27 DIAGNOSIS — R7303 Prediabetes: Secondary | ICD-10-CM

## 2022-06-27 DIAGNOSIS — E669 Obesity, unspecified: Secondary | ICD-10-CM | POA: Diagnosis not present

## 2022-06-27 DIAGNOSIS — E66813 Obesity, class 3: Secondary | ICD-10-CM

## 2022-06-27 DIAGNOSIS — E559 Vitamin D deficiency, unspecified: Secondary | ICD-10-CM | POA: Diagnosis not present

## 2022-06-27 DIAGNOSIS — Z6841 Body Mass Index (BMI) 40.0 and over, adult: Secondary | ICD-10-CM

## 2022-06-27 DIAGNOSIS — I10 Essential (primary) hypertension: Secondary | ICD-10-CM | POA: Diagnosis not present

## 2022-06-27 MED ORDER — AMLODIPINE BESYLATE 5 MG PO TABS
5.0000 mg | ORAL_TABLET | Freq: Every day | ORAL | 0 refills | Status: DC
  Filled 2022-06-27 – 2022-07-07 (×2): qty 30, 30d supply, fill #0

## 2022-06-27 MED ORDER — BUPROPION HCL ER (SR) 150 MG PO TB12
150.0000 mg | ORAL_TABLET | Freq: Every day | ORAL | 0 refills | Status: DC
  Filled 2022-06-27: qty 30, 30d supply, fill #0

## 2022-06-27 MED ORDER — VITAMIN D (ERGOCALCIFEROL) 1.25 MG (50000 UNIT) PO CAPS
50000.0000 [IU] | ORAL_CAPSULE | ORAL | 0 refills | Status: DC
  Filled 2022-06-27 – 2022-07-07 (×2): qty 4, 28d supply, fill #0

## 2022-07-07 ENCOUNTER — Other Ambulatory Visit (HOSPITAL_COMMUNITY): Payer: Self-pay

## 2022-07-10 NOTE — Progress Notes (Unsigned)
Chief Complaint:   OBESITY Sharon Fowler is here to discuss her progress with her obesity treatment plan along with follow-up of her obesity related diagnoses. Sharon Fowler is on the Category 2 Plan with 130 grams of protein daily and states she is following her eating plan approximately 15-20% of the time. Sharon Fowler states she is doing 0 minutes 0 times per week.  Today's visit was #: 5 Starting weight: 312 lbs Starting date: 05/03/2022 Today's weight: 309 lbs Today's date: 06/27/2022 Total lbs lost to date: 3 Total lbs lost since last in-office visit: 4  Interim History: Sharon Fowler is struggling with snacking at work.  She works night shift.  Has cut back on trail mix.  Has cravings but not really hunger.  Likes sweets.  Her daughter continues to buy sweets.  Subjective:   1. Vitamin D deficiency Sharon Fowler is on prescription Vitamin D 50,000 IU weekly.   2. Prediabetes Sharon Fowler's last A1c was 5.9 on 05/03/2022. Actively  working on decreasing her intake of sugar.   3. Essential hypertension Sharon Fowler's blood pressure is controlled on amlodipine 5 mg daily. She is on losartan 100 mg daily and Carvedilol 12.5 mg BID. She denies chest pain or side effects.   Assessment/Plan:   1. Vitamin D deficiency Sharon Fowler will continue prescription Vitamin D 50,000 IU weekly, and we will refill for 1 month. We will recheck level in 2 months.   - Vitamin D, Ergocalciferol, (DRISDOL) 1.25 MG (50000 UNIT) CAPS capsule; Take 1 capsule (50,000 Units total) by mouth every 7 (seven) days.  Dispense: 4 capsule; Refill: 0  2. Prediabetes Sharon Fowler will plan to increase walking time and decrease her intake of sweets. Consider adding metformin.   3. Essential hypertension Sharon Fowler will continue her medications, and we wil refill amlodipine 5 mg daily for 1 month.  - amLODipine (NORVASC) 5 MG tablet; Take 1 tablet (5 mg total) by mouth daily.  Dispense: 30 tablet; Refill: 0  4. Obesity, current BMI 53.2 Sharon Fowler is currently in the  action stage of change. As such, her goal is to continue with weight loss efforts. She has agreed to the Category 2 Plan with 130 grams of protein daily.   Sharon Fowler will begin Wellbutrin SR 150 mg daily with food, with no refills for sugar cravings.  - buPROPion (WELLBUTRIN SR) 150 MG 12 hr tablet; Take 1 tablet (150 mg total) by mouth daily.  Dispense: 30 tablet; Refill: 0  Exercise goals: No exercise has been prescribed at this time.  Behavioral modification strategies: increasing lean protein intake, increasing water intake, decreasing eating out, no skipping meals, and better snacking choices.  Sharon Fowler has agreed to follow-up with our clinic in 3 weeks. She was informed of the importance of frequent follow-up visits to maximize her success with intensive lifestyle modifications for her multiple health conditions.   Objective:   Blood pressure 134/80, pulse 68, temperature 97.9 F (36.6 C), height 5\' 4"  (1.626 m), weight (!) 309 lb (140.2 kg), last menstrual period 05/08/2011, SpO2 97 %. Body mass index is 53.04 kg/m.  General: Cooperative, alert, well developed, in no acute distress. HEENT: Conjunctivae and lids unremarkable. Cardiovascular: Regular rhythm.  Lungs: Normal work of breathing. Neurologic: No focal deficits.   Lab Results  Component Value Date   CREATININE 0.76 05/03/2022   BUN 10 05/03/2022   NA 139 05/03/2022   K 3.9 05/03/2022   CL 100 05/03/2022   CO2 24 05/03/2022   Lab Results  Component Value Date  ALT 18 05/03/2022   AST 23 05/03/2022   ALKPHOS 114 05/03/2022   BILITOT 0.4 05/03/2022   Lab Results  Component Value Date   HGBA1C 5.9 (H) 05/03/2022   HGBA1C 6.0 08/11/2021   HGBA1C 5.7 04/19/2020   HGBA1C 5.7 02/14/2018   HGBA1C 5.6 08/01/2017   Lab Results  Component Value Date   INSULIN 10.5 05/03/2022   Lab Results  Component Value Date   TSH 1.270 05/03/2022   Lab Results  Component Value Date   CHOL 196 05/03/2022   HDL 49  05/03/2022   LDLCALC 128 (H) 05/03/2022   LDLDIRECT 197.8 10/15/2013   TRIG 106 05/03/2022   CHOLHDL 4 08/11/2021   Lab Results  Component Value Date   VD25OH 36.3 05/03/2022   Lab Results  Component Value Date   WBC 9.8 05/03/2022   HGB 13.2 05/03/2022   HCT 40.5 05/03/2022   MCV 83 05/03/2022   PLT 344 05/03/2022   Lab Results  Component Value Date   FERRITIN 65.6 11/25/2010   Attestation Statements:   Reviewed by clinician on day of visit: allergies, medications, problem list, medical history, surgical history, family history, social history, and previous encounter notes.   Trude Mcburney, am acting as transcriptionist for Seymour Bars, DO.  I have reviewed the above documentation for accuracy and completeness, and I agree with the above. Glennis Brink, DO

## 2022-07-17 ENCOUNTER — Other Ambulatory Visit (HOSPITAL_COMMUNITY): Payer: Self-pay

## 2022-07-18 ENCOUNTER — Ambulatory Visit (INDEPENDENT_AMBULATORY_CARE_PROVIDER_SITE_OTHER): Payer: No Typology Code available for payment source | Admitting: Family Medicine

## 2022-07-18 ENCOUNTER — Encounter (INDEPENDENT_AMBULATORY_CARE_PROVIDER_SITE_OTHER): Payer: Self-pay | Admitting: Family Medicine

## 2022-07-18 ENCOUNTER — Other Ambulatory Visit (HOSPITAL_COMMUNITY): Payer: Self-pay

## 2022-07-18 VITALS — BP 133/85 | HR 79 | Temp 98.0°F | Ht 64.0 in | Wt 307.0 lb

## 2022-07-18 DIAGNOSIS — M25562 Pain in left knee: Secondary | ICD-10-CM | POA: Diagnosis not present

## 2022-07-18 DIAGNOSIS — E559 Vitamin D deficiency, unspecified: Secondary | ICD-10-CM | POA: Diagnosis not present

## 2022-07-18 DIAGNOSIS — M25561 Pain in right knee: Secondary | ICD-10-CM | POA: Diagnosis not present

## 2022-07-18 DIAGNOSIS — I10 Essential (primary) hypertension: Secondary | ICD-10-CM | POA: Diagnosis not present

## 2022-07-18 DIAGNOSIS — F32A Depression, unspecified: Secondary | ICD-10-CM | POA: Insufficient documentation

## 2022-07-18 DIAGNOSIS — F3289 Other specified depressive episodes: Secondary | ICD-10-CM

## 2022-07-18 DIAGNOSIS — Z6841 Body Mass Index (BMI) 40.0 and over, adult: Secondary | ICD-10-CM

## 2022-07-18 DIAGNOSIS — E669 Obesity, unspecified: Secondary | ICD-10-CM

## 2022-07-18 MED ORDER — AMLODIPINE BESYLATE 5 MG PO TABS
5.0000 mg | ORAL_TABLET | Freq: Every day | ORAL | 0 refills | Status: DC
  Filled 2022-07-18: qty 30, 30d supply, fill #0

## 2022-07-18 MED ORDER — BUPROPION HCL ER (SR) 150 MG PO TB12
150.0000 mg | ORAL_TABLET | Freq: Every day | ORAL | 0 refills | Status: DC
  Filled 2022-07-18 – 2022-08-07 (×2): qty 30, 30d supply, fill #0

## 2022-07-18 MED ORDER — VITAMIN D (ERGOCALCIFEROL) 1.25 MG (50000 UNIT) PO CAPS
50000.0000 [IU] | ORAL_CAPSULE | ORAL | 0 refills | Status: DC
  Filled 2022-07-18 – 2022-08-07 (×2): qty 4, 28d supply, fill #0

## 2022-07-20 NOTE — Progress Notes (Signed)
Chief Complaint:   OBESITY Sharon Fowler is here to discuss her progress with her obesity treatment plan along with follow-up of her obesity related diagnoses. Sharon Fowler is on the Category 3 Plan +130 gram protein and states she is following her eating plan approximately 25% of the time. Sharon Fowler states she is not exercising.   Today's visit was #: 6 Starting weight: 312 lbs Starting date: 05/03/2022 Today's weight: 307 lbs Today's date: 07/18/2022 Total lbs lost to date: 5 lbs Total lbs lost since last in-office visit: 2 lbs  Interim History: Actively working on reducing portions.  Daughter does buy sweets.  Gave in twice to cravings. Doing better with meal plan.  Struggling to get in enough water.  Traded in trail  mix at work for fruit, greek yogurt, meat sticks, mini kind bars.  Grazing on snacks but has moved to eating every 4 hours.  Cravings reduced with Wellbutrin.   Subjective:   1. Essential hypertension Blood pressure under good control.  Sh e is on carvedilol and Norvasc.  2. Vitamin D deficiency She is currently taking prescription vitamin D 50,000 IU each week. She denies nausea, vomiting or muscle weakness.  3. Pain in both knees, unspecified chronicity Taking ibuprofen 1-2 times per day.  She has pain and swelling with prolonged sitting.   4. Other depression,with emotional eating Her cravings and emotional eating has improved with Wellbutrin SR 150 mg, once a day at the start of the day.   Assessment/Plan:   1. Essential hypertension Refill - amLODipine (NORVASC) 5 MG tablet; Take 1 tablet (5 mg total) by mouth daily.  Dispense: 30 tablet; Refill: 0  2. Vitamin D deficiency Recheck Vitamin D level next office visit.   Refill - Vitamin D, Ergocalciferol, (DRISDOL) 1.25 MG (50000 UNIT) CAPS capsule; Take 1 capsule (50,000 Units total) by mouth every 7 (seven) days.  Dispense: 4 capsule; Refill: 0  3. Pain in both knees, unspecified chronicity Look for improvements  with weight loss.  Begin short walks.  Check BMP with next labs due to NSAID use.   4. Other depression,with emotional eating Refill - buPROPion (WELLBUTRIN SR) 150 MG 12 hr tablet; Take 1 tablet (150 mg total) by mouth daily.  Dispense: 30 tablet; Refill: 0  5. Obesity, current BMI 52.8 Sharon Fowler is currently in the action stage of change. As such, her goal is to continue with weight loss efforts. She has agreed to the Category 2 Plan + 130 protein daily  Exercise goals:  short walks .  Behavioral modification strategies: increasing lean protein intake, increasing vegetables, increasing water intake, increasing high fiber foods, decreasing eating out, no skipping meals, meal planning and cooking strategies, better snacking choices, and decreasing junk food.  Sharon Fowler has agreed to follow-up with our clinic in 3 weeks. She was informed of the importance of frequent follow-up visits to maximize her success with intensive lifestyle modifications for her multiple health conditions.   Objective:   Blood pressure 133/85, pulse 79, temperature 98 F (36.7 C), height 5\' 4"  (1.626 m), weight (!) 307 lb (139.3 kg), last menstrual period 05/08/2011, SpO2 99 %. Body mass index is 52.7 kg/m.  General: Cooperative, alert, well developed, in no acute distress. HEENT: Conjunctivae and lids unremarkable. Cardiovascular: Regular rhythm.  Lungs: Normal work of breathing. Neurologic: No focal deficits.   Lab Results  Component Value Date   CREATININE 0.76 05/03/2022   BUN 10 05/03/2022   NA 139 05/03/2022   K 3.9 05/03/2022  CL 100 05/03/2022   CO2 24 05/03/2022   Lab Results  Component Value Date   ALT 18 05/03/2022   AST 23 05/03/2022   ALKPHOS 114 05/03/2022   BILITOT 0.4 05/03/2022   Lab Results  Component Value Date   HGBA1C 5.9 (H) 05/03/2022   HGBA1C 6.0 08/11/2021   HGBA1C 5.7 04/19/2020   HGBA1C 5.7 02/14/2018   HGBA1C 5.6 08/01/2017   Lab Results  Component Value Date    INSULIN 10.5 05/03/2022   Lab Results  Component Value Date   TSH 1.270 05/03/2022   Lab Results  Component Value Date   CHOL 196 05/03/2022   HDL 49 05/03/2022   LDLCALC 128 (H) 05/03/2022   LDLDIRECT 197.8 10/15/2013   TRIG 106 05/03/2022   CHOLHDL 4 08/11/2021   Lab Results  Component Value Date   VD25OH 36.3 05/03/2022   Lab Results  Component Value Date   WBC 9.8 05/03/2022   HGB 13.2 05/03/2022   HCT 40.5 05/03/2022   MCV 83 05/03/2022   PLT 344 05/03/2022   Lab Results  Component Value Date   FERRITIN 65.6 11/25/2010   Attestation Statements:   Reviewed by clinician on day of visit: allergies, medications, problem list, medical history, surgical history, family history, social history, and previous encounter notes.  bloI, Davy Pique, am acting as Location manager for Loyal Gambler, DO.  I have reviewed the above documentation for accuracy and completeness, and I agree with the above. Dell Ponto, DO

## 2022-08-07 ENCOUNTER — Other Ambulatory Visit (HOSPITAL_COMMUNITY): Payer: Self-pay

## 2022-08-08 ENCOUNTER — Encounter (INDEPENDENT_AMBULATORY_CARE_PROVIDER_SITE_OTHER): Payer: Self-pay | Admitting: Family Medicine

## 2022-08-08 ENCOUNTER — Ambulatory Visit (INDEPENDENT_AMBULATORY_CARE_PROVIDER_SITE_OTHER): Payer: No Typology Code available for payment source | Admitting: Family Medicine

## 2022-08-08 ENCOUNTER — Other Ambulatory Visit (HOSPITAL_COMMUNITY): Payer: Self-pay

## 2022-08-08 VITALS — BP 132/84 | HR 74 | Temp 98.1°F | Ht 64.0 in | Wt 303.0 lb

## 2022-08-08 DIAGNOSIS — E559 Vitamin D deficiency, unspecified: Secondary | ICD-10-CM | POA: Diagnosis not present

## 2022-08-08 DIAGNOSIS — F3289 Other specified depressive episodes: Secondary | ICD-10-CM | POA: Diagnosis not present

## 2022-08-08 DIAGNOSIS — I10 Essential (primary) hypertension: Secondary | ICD-10-CM

## 2022-08-08 DIAGNOSIS — M25561 Pain in right knee: Secondary | ICD-10-CM | POA: Diagnosis not present

## 2022-08-08 DIAGNOSIS — E669 Obesity, unspecified: Secondary | ICD-10-CM

## 2022-08-08 DIAGNOSIS — Z6841 Body Mass Index (BMI) 40.0 and over, adult: Secondary | ICD-10-CM

## 2022-08-08 DIAGNOSIS — M25562 Pain in left knee: Secondary | ICD-10-CM

## 2022-08-08 MED ORDER — VITAMIN D (ERGOCALCIFEROL) 1.25 MG (50000 UNIT) PO CAPS: 50000.0000 [IU] | ORAL_CAPSULE | ORAL | 0 refills | Status: DC

## 2022-08-08 MED ORDER — AMLODIPINE BESYLATE 5 MG PO TABS
5.0000 mg | ORAL_TABLET | Freq: Every day | ORAL | 0 refills | Status: DC
  Filled 2022-08-08: qty 30, 30d supply, fill #0

## 2022-08-08 MED ORDER — BUPROPION HCL ER (SR) 150 MG PO TB12: 150.0000 mg | ORAL_TABLET | Freq: Every day | ORAL | 0 refills | Status: DC

## 2022-08-10 NOTE — Progress Notes (Signed)
Chief Complaint:   OBESITY Sharon Fowler is here to discuss her progress with her obesity treatment plan along with follow-up of her obesity related diagnoses. Sharon Fowler is on the Category 2 Plan and states she is following her eating plan approximately 15% of the time. Sharon Fowler states she has not been exercising.  Today's visit was #: 7 Starting weight: 312 lbs Starting date: 05/03/2022 Today's weight: 303 lbs Today's date: 08/08/22 Total lbs lost to date: 9 Total lbs lost since last in-office visit: -4  Interim History: Doing better with meal planning.  Working on meal timing.  Still tends to over snack.  Choosing better snacks.  Practicing portion control.  Getting in fruits and vegetables.  Knee pain continues to limit exercise.  Wakes up late and struggles to bring meals to work.  Subjective:   1. Other depression,with emotional eating Improved on Wellbutrin SR 150 mg every morning. Denies adverse side effects. Lacks a good support system.  2. Vitamin D deficiency On prescription vitamin D 50,000 IU weekly.  Energy level improving.  3. Essential hypertension Blood pressure well controlled on amlodipine 5 mg daily. On losartan 100 mg daily and carvedilol 150 mg twice daily.  4. Pain in both knees, unspecified chronicity Using ibuprofen 400 mg twice daily for pain-helps some. Knee pain limits exercise.  Assessment/Plan:   1. Other depression,with emotional eating Refilled: - buPROPion (WELLBUTRIN SR) 150 MG 12 hr tablet; Take 1 tablet (150 mg total) by mouth daily.  Dispense: 30 tablet; Refill: 0  2. Vitamin D deficiency Refilled: - Vitamin D, Ergocalciferol, (DRISDOL) 1.25 MG (50000 UNIT) CAPS capsule; Take 1 capsule (50,000 Units total) by mouth every 7 (seven) days.  Dispense: 4 capsule; Refill: 0 Check vitamin D level: - Vitamin D, 25-Hydroxy, Total  3. Essential hypertension Refilled: - amLODipine (NORVASC) 5 MG tablet; Take 1 tablet (5 mg total) by mouth daily.   Dispense: 30 tablet; Refill: 0 Check labs: - Basic Metabolic Panel (BMET)  4. Pain in both knees, unspecified chronicity Check BMP today due to daily NSAID use. Take ibuprofen with food; consider change to meloxicam.  5. Obesity, current BMI 52.0 1.  Discussed easy grab and go meals/snacks to bring to work.  Sharon Fowler is currently in the action stage of change. As such, her goal is to continue with weight loss efforts. She has agreed to the Category 2 Plan.   Exercise goals: All adults should avoid inactivity. Some physical activity is better than none, and adults who participate in any amount of physical activity gain some health benefits.  Behavioral modification strategies: increasing lean protein intake, increasing vegetables, increasing water intake, decreasing eating out, no skipping meals, meal planning and cooking strategies, keeping healthy foods in the home, better snacking choices, planning for success, and decreasing junk food.  Sharon Fowler has agreed to follow-up with our clinic in 3 weeks. She was informed of the importance of frequent follow-up visits to maximize her success with intensive lifestyle modifications for her multiple health conditions.   Sharon Fowler was informed we would discuss her lab results at her next visit unless there is a critical issue that needs to be addressed sooner. Sharon Fowler agreed to keep her next visit at the agreed upon time to discuss these results.  Objective:   Blood pressure 132/84, pulse 74, temperature 98.1 F (36.7 C), height 5\' 4"  (1.626 m), weight (!) 303 lb (137.4 kg), last menstrual period 05/08/2011, SpO2 98 %. Body mass index is 52.01 kg/m.  General: Cooperative, alert,  well developed, in no acute distress. HEENT: Conjunctivae and lids unremarkable. Cardiovascular: Regular rhythm.  Lungs: Normal work of breathing. Neurologic: No focal deficits.   Lab Results  Component Value Date   CREATININE 0.70 08/08/2022   BUN 16 08/08/2022   NA 140  08/08/2022   K 4.3 08/08/2022   CL 104 08/08/2022   CO2 24 08/08/2022   Lab Results  Component Value Date   ALT 18 05/03/2022   AST 23 05/03/2022   ALKPHOS 114 05/03/2022   BILITOT 0.4 05/03/2022   Lab Results  Component Value Date   HGBA1C 5.9 (H) 05/03/2022   HGBA1C 6.0 08/11/2021   HGBA1C 5.7 04/19/2020   HGBA1C 5.7 02/14/2018   HGBA1C 5.6 08/01/2017   Lab Results  Component Value Date   INSULIN 10.5 05/03/2022   Lab Results  Component Value Date   TSH 1.270 05/03/2022   Lab Results  Component Value Date   CHOL 196 05/03/2022   HDL 49 05/03/2022   LDLCALC 128 (H) 05/03/2022   LDLDIRECT 197.8 10/15/2013   TRIG 106 05/03/2022   CHOLHDL 4 08/11/2021   Lab Results  Component Value Date   VD25OH 36.3 05/03/2022   Lab Results  Component Value Date   WBC 9.8 05/03/2022   HGB 13.2 05/03/2022   HCT 40.5 05/03/2022   MCV 83 05/03/2022   PLT 344 05/03/2022   Lab Results  Component Value Date   FERRITIN 65.6 11/25/2010     Attestation Statements:   Reviewed by clinician on day of visit: allergies, medications, problem list, medical history, surgical history, family history, social history, and previous encounter notes.  I, Georgianne Fick, FNP, am acting as transcriptionist for Dr. Loyal Gambler.  I have reviewed the above documentation for accuracy and completeness, and I agree with the above. Dell Ponto, DO

## 2022-08-14 ENCOUNTER — Other Ambulatory Visit (HOSPITAL_COMMUNITY): Payer: Self-pay

## 2022-08-14 LAB — BASIC METABOLIC PANEL
BUN/Creatinine Ratio: 23 (ref 9–23)
BUN: 16 mg/dL (ref 6–24)
CO2: 24 mmol/L (ref 20–29)
Calcium: 9.4 mg/dL (ref 8.7–10.2)
Chloride: 104 mmol/L (ref 96–106)
Creatinine, Ser: 0.7 mg/dL (ref 0.57–1.00)
Glucose: 95 mg/dL (ref 70–99)
Potassium: 4.3 mmol/L (ref 3.5–5.2)
Sodium: 140 mmol/L (ref 134–144)
eGFR: 106 mL/min/{1.73_m2} (ref >59)

## 2022-08-14 LAB — VITAMIN D, 25-HYDROXY, TOTAL: Vitamin D, 25-Hydroxy, Serum: 42 ng/mL

## 2022-08-30 ENCOUNTER — Other Ambulatory Visit (HOSPITAL_COMMUNITY): Payer: Self-pay

## 2022-08-30 ENCOUNTER — Ambulatory Visit (INDEPENDENT_AMBULATORY_CARE_PROVIDER_SITE_OTHER): Payer: No Typology Code available for payment source | Admitting: Family Medicine

## 2022-08-30 ENCOUNTER — Ambulatory Visit (INDEPENDENT_AMBULATORY_CARE_PROVIDER_SITE_OTHER): Payer: No Typology Code available for payment source

## 2022-08-30 ENCOUNTER — Ambulatory Visit: Payer: No Typology Code available for payment source

## 2022-08-30 ENCOUNTER — Encounter: Payer: Self-pay | Admitting: Family Medicine

## 2022-08-30 VITALS — BP 132/86 | HR 80 | Temp 98.6°F | Wt 305.2 lb

## 2022-08-30 DIAGNOSIS — G8929 Other chronic pain: Secondary | ICD-10-CM

## 2022-08-30 DIAGNOSIS — M25561 Pain in right knee: Secondary | ICD-10-CM | POA: Diagnosis not present

## 2022-08-30 DIAGNOSIS — F3289 Other specified depressive episodes: Secondary | ICD-10-CM | POA: Diagnosis not present

## 2022-08-30 DIAGNOSIS — E559 Vitamin D deficiency, unspecified: Secondary | ICD-10-CM

## 2022-08-30 DIAGNOSIS — I10 Essential (primary) hypertension: Secondary | ICD-10-CM

## 2022-08-30 DIAGNOSIS — M25562 Pain in left knee: Secondary | ICD-10-CM

## 2022-08-30 MED ORDER — VITAMIN D (ERGOCALCIFEROL) 1.25 MG (50000 UNIT) PO CAPS
50000.0000 [IU] | ORAL_CAPSULE | ORAL | 0 refills | Status: DC
  Filled 2022-08-30 – 2022-09-09 (×2): qty 12, 84d supply, fill #0

## 2022-08-30 MED ORDER — AMLODIPINE BESYLATE 5 MG PO TABS
5.0000 mg | ORAL_TABLET | Freq: Every day | ORAL | 3 refills | Status: DC
  Filled 2022-08-30 – 2022-09-09 (×2): qty 90, 90d supply, fill #0
  Filled 2022-12-04: qty 90, 90d supply, fill #1
  Filled 2022-12-06 – 2023-03-04 (×2): qty 90, 90d supply, fill #2
  Filled 2023-06-03: qty 90, 90d supply, fill #3

## 2022-08-30 MED ORDER — CARVEDILOL 12.5 MG PO TABS
12.5000 mg | ORAL_TABLET | Freq: Two times a day (BID) | ORAL | 3 refills | Status: DC
  Filled 2022-08-30 – 2022-09-09 (×2): qty 180, 90d supply, fill #0
  Filled 2022-12-11: qty 180, 90d supply, fill #1
  Filled 2023-03-08: qty 180, 90d supply, fill #2
  Filled 2023-06-04: qty 180, 90d supply, fill #3

## 2022-08-30 MED ORDER — LOSARTAN POTASSIUM 100 MG PO TABS
100.0000 mg | ORAL_TABLET | Freq: Every day | ORAL | 3 refills | Status: DC
  Filled 2022-08-30 – 2022-09-09 (×2): qty 90, 90d supply, fill #0
  Filled 2022-12-06: qty 90, 90d supply, fill #1
  Filled 2023-03-04: qty 90, 90d supply, fill #2
  Filled 2023-06-03: qty 90, 90d supply, fill #3

## 2022-08-30 MED ORDER — BUPROPION HCL ER (SR) 150 MG PO TB12
150.0000 mg | ORAL_TABLET | Freq: Every day | ORAL | 0 refills | Status: DC
  Filled 2022-08-30 – 2022-09-09 (×2): qty 90, 90d supply, fill #0

## 2022-08-30 NOTE — Patient Instructions (Signed)
X-rays for your knees were ordered this visit.  Refills on your medications were sent to your pharmacy.  We will have you follow-up at your convenience for a physical.

## 2022-08-30 NOTE — Progress Notes (Signed)
Subjective:    Patient ID: Sharon Fowler, female    DOB: April 04, 1971, 51 y.o.   MRN: [id number]  Chief Complaint  Patient presents with   Medication Refill    refills    HPI Patient was seen today for med refills.  Patient taking Norvasc 5 mg daily, losartan 100 mg daily, and Coreg 12.5 mg twice daily for HTN.  Followed by weight management.  Requesting this provider for prescriptions for vitamin D, Norvasc 5 mg, and Wellbutrin XL as she is only given 30-day supply at a time which is becoming expensive.  Patient also taking losartan 100 mg daily and Coreg 12.5 mg twice daily for BP.  States blood pressure controlled on this combination.  Patient states Wellbutrin helps decrease cravings.  Endorses recent stress.   Patient endorses bilateral knee pain times a while.  At times right knee feels unstable more than left knee.  Has mild edema in bilateral knees after moving from sitting to standing position.  At times endorses crepitus.  Patient taking ibuprofen 400 mg twice daily for knee pain.  In the past tried Voltaren gel but did not use it consistently.  Patient is adamant against injections.  Past Medical History:  Diagnosis Date   ANEMIA, IRON DEFICIENCY UNSPEC 06/11/2009   corrected with iron replacement---menstrual reelated   Bilateral swelling of feet    Constipation    GOITER, MULTINODULAR 07/15/2009   HYPERLIPIDEMIA 03/25/2008   HYPERTENSION 03/25/2008   OBESITY 03/25/2008   Prediabetes    SOB (shortness of breath)    Swallowing difficulty     Allergies  Allergen Reactions   Chlorthalidone Other (See Comments)    REACTION: hypokalemia (2.8)   Lisinopril     cough    ROS General: Denies fever, chills, night sweats +changes in weight, changes in appetite HEENT: Denies headaches, ear pain, changes in vision, rhinorrhea, sore throat CV: Denies CP, palpitations, SOB, orthopnea Pulm: Denies SOB, cough, wheezing GI: Denies abdominal pain, nausea, vomiting, diarrhea,  constipation GU: Denies dysuria, hematuria, frequency, vaginal discharge Msk: Denies muscle cramps, joint pains +b/l knee pain and edema Neuro: Denies weakness, numbness, tingling Skin: Denies rashes, bruising Psych: Denies depression, anxiety, hallucinations     Objective:    Blood pressure 132/86, pulse 80, temperature 98.6 F (37 C), temperature source Oral, weight (!) 305 lb 3.2 oz (138.4 kg), last menstrual period 05/08/2011, SpO2 99 %.  Gen. Pleasant, well-nourished, in no distress, normal affect   HEENT: [place]/AT, face symmetric, conjunctiva clear, no scleral icterus, PERRLA, EOMI, nares patent without drainage Lungs: no accessory muscle use, CTAB, no wheezes or rales Cardiovascular: RRR, no m/r/g, no peripheral edema Musculoskeletal: No bilateral knee edema, crepitus, or TTP of joint line bilaterally.  No deformities, no cyanosis or clubbing, normal tone Neuro:  A&Ox3, CN II-XII intact, normal gait Skin:  Warm, no lesions/ rash   Wt Readings from Last 3 Encounters:  08/30/22 (!) 305 lb 3.2 oz (138.4 kg)  08/08/22 (!) 303 lb (137.4 kg)  07/18/22 (!) 307 lb (139.3 kg)    Lab Results  Component Value Date   WBC 9.8 05/03/2022   HGB 13.2 05/03/2022   HCT 40.5 05/03/2022   PLT 344 05/03/2022   GLUCOSE 95 08/08/2022   CHOL 196 05/03/2022   TRIG 106 05/03/2022   HDL 49 05/03/2022   LDLDIRECT 197.8 10/15/2013   LDLCALC 128 (H) 05/03/2022   ALT 18 05/03/2022   AST 23 05/03/2022   NA 140 08/08/2022   K 4.3  08/08/2022   CL 104 08/08/2022   CREATININE 0.70 08/08/2022   BUN 16 08/08/2022   CO2 24 08/08/2022   TSH 1.270 05/03/2022   HGBA1C 5.9 (H) 05/03/2022      08/30/2022   11:20 AM 05/03/2022    8:01 AM 12/02/2020    7:49 AM  Depression screen PHQ 2/9  Decreased Interest 0 3 0  Down, Depressed, Hopeless 0 1 0  PHQ - 2 Score 0 4 0  Altered sleeping 0 0   Tired, decreased energy 1 3   Change in appetite 0 0   Feeling bad or failure about yourself  0 0   Trouble  concentrating 0 1   Moving slowly or fidgety/restless 0 1   Suicidal thoughts 0 0   PHQ-9 Score 1 9   Difficult doing work/chores Not difficult at all Not difficult at all      Assessment/Plan:  Essential hypertension  -controlled -Continue current medications including Norvasc 5 mg, Coreg 12.5 mg twice daily, losartan 100 mg daily -Discussed the importance of lifestyle modifications -Patient encouraged to monitor BP at home or work - Plan: amLODipine (NORVASC) 5 MG tablet, carvedilol (COREG) 12.5 MG tablet, losartan (COZAAR) 100 MG tablet  Chronic pain of both knees  -Possible causes including arthritis, weight, etc -Continuing supportive care including OTC tylenol and Ibuprofen, topical analgesics, heat, elevation, compression -Given handout on exercises -We will obtain x-ray bilateral knees -Further recommendations based on imaging results - Plan: DG Knee Complete 4 Views Right, DG Knee Complete 4 Views Left  Other depression,with emotional eating  - Plan: buPROPion (WELLBUTRIN SR) 150 MG 12 hr tablet  Vitamin D deficiency  - Plan: Vitamin D, Ergocalciferol, (DRISDOL) 1.25 MG (50000 UNIT) CAPS capsule  Morbid obesity (HCC) -Body mass index is 52.39 kg/m. -Continue follow-up with weight management clinic  F/u next few weeks for CPE  Abbe Amsterdam, MD

## 2022-09-04 ENCOUNTER — Encounter: Payer: Self-pay | Admitting: Family Medicine

## 2022-09-04 DIAGNOSIS — M170 Bilateral primary osteoarthritis of knee: Secondary | ICD-10-CM | POA: Insufficient documentation

## 2022-09-11 ENCOUNTER — Other Ambulatory Visit (HOSPITAL_COMMUNITY): Payer: Self-pay

## 2022-09-11 ENCOUNTER — Encounter (INDEPENDENT_AMBULATORY_CARE_PROVIDER_SITE_OTHER): Payer: Self-pay | Admitting: Internal Medicine

## 2022-09-11 ENCOUNTER — Ambulatory Visit (INDEPENDENT_AMBULATORY_CARE_PROVIDER_SITE_OTHER): Payer: No Typology Code available for payment source | Admitting: Internal Medicine

## 2022-09-11 VITALS — BP 138/84 | HR 78 | Temp 97.7°F | Ht 64.0 in | Wt 300.0 lb

## 2022-09-11 DIAGNOSIS — E559 Vitamin D deficiency, unspecified: Secondary | ICD-10-CM

## 2022-09-11 DIAGNOSIS — I10 Essential (primary) hypertension: Secondary | ICD-10-CM | POA: Diagnosis not present

## 2022-09-11 DIAGNOSIS — F3289 Other specified depressive episodes: Secondary | ICD-10-CM | POA: Diagnosis not present

## 2022-09-11 DIAGNOSIS — R7303 Prediabetes: Secondary | ICD-10-CM | POA: Diagnosis not present

## 2022-09-11 DIAGNOSIS — Z6841 Body Mass Index (BMI) 40.0 and over, adult: Secondary | ICD-10-CM

## 2022-09-11 DIAGNOSIS — E669 Obesity, unspecified: Secondary | ICD-10-CM

## 2022-09-11 MED ORDER — VITAMIN D (ERGOCALCIFEROL) 1.25 MG (50000 UNIT) PO CAPS
50000.0000 [IU] | ORAL_CAPSULE | ORAL | 0 refills | Status: DC
  Filled 2022-09-11: qty 12, 84d supply, fill #0

## 2022-09-20 ENCOUNTER — Encounter: Payer: Self-pay | Admitting: Family Medicine

## 2022-09-20 ENCOUNTER — Ambulatory Visit (INDEPENDENT_AMBULATORY_CARE_PROVIDER_SITE_OTHER): Payer: No Typology Code available for payment source | Admitting: Family Medicine

## 2022-09-20 VITALS — BP 128/88 | HR 75 | Temp 98.5°F | Ht 63.25 in | Wt 302.2 lb

## 2022-09-20 DIAGNOSIS — Z0000 Encounter for general adult medical examination without abnormal findings: Secondary | ICD-10-CM

## 2022-09-20 NOTE — Progress Notes (Signed)
Subjective:     Sharon Fowler is a 51 y.o. female and is here for a comprehensive physical exam. The patient reports no problems.  Had a a few episodes of dizziness, after blowing nose hard.  States blows her nose hard out of habit.  Using hydrogen peroxide and water nasal rinse 2-3 times per week for sinus congestion.  Patient followed by healthy weight.  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    She likes to sleep when she is not at work.    Social Determinants of Health   Financial Resource Strain: Low Risk  (10/09/2021)   Overall Financial Resource Strain (CARDIA)    Difficulty of Paying Living Expenses: Not very hard  Food Insecurity: Food Insecurity Present (10/09/2021)   Hunger Vital Sign    Worried About Running Out of Food in the Last Year: Sometimes true    Ran Out of Food in the Last Year: Sometimes true  Transportation Needs: No Transportation Needs (10/09/2021)   PRAPARE - Administrator, Civil Service (Medical): No    Lack of Transportation (Non-Medical): No  Physical Activity: Unknown (10/09/2021)   Exercise Vital Sign    Days of Exercise per Week: 0 days    Minutes of  Exercise per Session: Not on file  Stress: No Stress Concern Present (10/09/2021)   [employer] of Occupational Health - Occupational Stress Questionnaire    Feeling of Stress : Only a little  Social Connections: Socially Isolated (10/09/2021)   Social Connection and Isolation Panel [NHANES]    Frequency of Communication with Friends and Family: Once a week    Frequency of Social Gatherings with Friends and Family: Once a week    Attends Religious Services: Never    Database administrator or Organizations: No    Attends Engineer, structural: Not on file    Marital Status: Divorced  Intimate Partner Violence: Not on file   Health Maintenance  Topic Date Due   COVID-19 Vaccine (1) Never done   HIV Screening  Never done   Hepatitis C Screening  Never done   COLONOSCOPY (Pts 45-85yrs Insurance coverage will need to be confirmed)  Never done   MAMMOGRAM  09/28/2020   PAP SMEAR-Modifier  09/28/2020   INFLUENZA VACCINE  Completed   HPV VACCINES  Aged Out    The following portions of the patient's history were reviewed and updated as appropriate: allergies, current medications, past family history, past medical history, past social history, past surgical history, and problem list.  Review of Systems Pertinent items noted in HPI and remainder of comprehensive ROS otherwise negative.   Objective:    BP 128/88 (BP Location: Right Wrist, Patient Position: Sitting, Cuff Size: Normal)   Pulse 75   Temp 98.5 F (36.9 C) (Oral)   Ht 5' 3.25" (1.607 m)   Wt (!) 302 lb 3.2 oz (137.1 kg)   LMP 05/08/2011 (Approximate)   SpO2 97%   BMI 53.11 kg/m  General appearance: alert, cooperative, and no distress Head: Normocephalic, without obvious abnormality, atraumatic Eyes: conjunctivae/corneas clear. PERRL, EOM's intact. Fundi benign. Ears: normal TM's and external ear canals both ears Nose: Nares normal. Septum midline. Mucosa normal. No drainage or sinus tenderness. Throat:  lips, mucosa, and tongue normal; teeth and gums normal Neck: no adenopathy, no carotid bruit, no JVD, supple, symmetrical, trachea midline, and thyroid not enlarged, symmetric, no tenderness/mass/nodules Lungs: clear to auscultation bilaterally Heart: regular rate and rhythm, S1, S2 normal, no murmur, click, rub or gallop Abdomen: soft, non-tender; bowel sounds normal; no masses,  no organomegaly Extremities: extremities normal, atraumatic, no cyanosis or edema Pulses: 2+ and symmetric Skin: Skin color, texture, turgor normal. No rashes or lesions Lymph nodes: Cervical, supraclavicular, and axillary nodes normal. Neurologic: Alert and oriented X 3, normal strength and tone. Normal symmetric reflexes. Normal coordination and gait    Assessment:    Healthy female exam.      Plan:    Anticipatory guidance given including wearing seatbelts, smoke detectors in the home, increasing physical activity, increasing p.o. intake of water and vegetables. -labs from 05/03/22 and 08/08/22 reviewed.  Patient also has regular labs and weight management. -mammogram and pap scheduled for Dec 19.   -cologuard done 11/11/20 -Immunizations reviewed.  Patient had 2 doses of Pfizer COVID-vaccine 05/21/2020 and 06/17/2020.  Had flu vaccine 07/17/2022. -next CPE next yr. See After Visit Summary for Counseling Recommendations   Follow-up as needed  Abbe Amsterdam, MD

## 2022-09-25 ENCOUNTER — Ambulatory Visit (INDEPENDENT_AMBULATORY_CARE_PROVIDER_SITE_OTHER): Payer: No Typology Code available for payment source | Admitting: Family Medicine

## 2022-09-25 ENCOUNTER — Encounter (INDEPENDENT_AMBULATORY_CARE_PROVIDER_SITE_OTHER): Payer: Self-pay | Admitting: Family Medicine

## 2022-09-25 VITALS — BP 148/84 | HR 79 | Temp 97.7°F | Ht 64.0 in | Wt 300.0 lb

## 2022-09-25 DIAGNOSIS — Z6841 Body Mass Index (BMI) 40.0 and over, adult: Secondary | ICD-10-CM

## 2022-09-25 DIAGNOSIS — F3289 Other specified depressive episodes: Secondary | ICD-10-CM | POA: Diagnosis not present

## 2022-09-25 DIAGNOSIS — I10 Essential (primary) hypertension: Secondary | ICD-10-CM | POA: Diagnosis not present

## 2022-09-25 DIAGNOSIS — E669 Obesity, unspecified: Secondary | ICD-10-CM | POA: Diagnosis not present

## 2022-09-25 DIAGNOSIS — E559 Vitamin D deficiency, unspecified: Secondary | ICD-10-CM | POA: Diagnosis not present

## 2022-09-25 DIAGNOSIS — Z9889 Other specified postprocedural states: Secondary | ICD-10-CM

## 2022-09-25 DIAGNOSIS — Z9884 Bariatric surgery status: Secondary | ICD-10-CM

## 2022-09-26 NOTE — Progress Notes (Unsigned)
Chief Complaint:   OBESITY Sharon Fowler is here to discuss her progress with her obesity treatment plan along with follow-up of her obesity related diagnoses. Sharon Fowler is on the Category 2 Plan and states she is following her eating plan approximately 30% of the time. Sharon Fowler states she is walking 10 minutes 3 times per week.  Today's visit was #: 8 Starting weight: 312 lbs Starting date: 05/03/2022 Today's weight: 300 lbs Today's date: 09/11/2022 Total lbs lost to date: 12 Total lbs lost since last in-office visit: 3  Interim History: Pt has had gradual implementation of plan. She skips meals sometimes. Family sometimes provides calorie *** food. Pt works night shifts. He daughter had a miscarriage and it has been a source of stress.  Subjective:   1. Prediabetes Discussed labs with patient today. A1c 5.9 on last check. She is asymptomatic, but at risk for progression.  2. Other depression,with emotional eating Emotional eating is still a problem for pt. She has been counseled on behavioral techniques.  3. Vitamin D deficiency She is currently taking prescription vitamin D 50,000 IU each week. She denies nausea, vomiting or muscle weakness.  4. Essential hypertension BP above goal of 120/80.  Assessment/Plan:   1. Prediabetes Continue weight loss therapy. Consider Metformin for prevention and weight loss.  2. Other depression,with emotional eating Behavioral modifications discussed. Continue Wellbutrin with refills by PCP.  3. Vitamin D deficiency Low Vitamin D level contributes to fatigue and are associated with obesity, breast, and colon cancer. She agrees to continue to take prescription Vitamin D 50,000 IU every week and will follow-up for routine testing of Vitamin D, at least 2-3 times per year to avoid over-replacement.  Refill- Vitamin D, Ergocalciferol, (DRISDOL) 1.25 MG (50000 UNIT) CAPS capsule; Take 1 capsule (50,000 Units total) by mouth every 7 (seven) days.   Dispense: 12 capsule; Refill: 0  4. Essential hypertension Monitor BP at home. Contact PCP if BP trends >120/80. Continue amlodipine and losartan.   5. Obesity, current BMI 51.5 Sharon Fowler is currently in the action stage of change. As such, her goal is to continue with weight loss efforts. She has agreed to the Category 2 Plan.   Exercise goals:  Strength training via YouTube videos 2-3 a week.  Behavioral modification strategies: increasing lean protein intake and increasing vegetables.  Sharon Fowler has agreed to follow-up with our clinic in 2 weeks. She was informed of the importance of frequent follow-up visits to maximize her success with intensive lifestyle modifications for her multiple health conditions.   Objective:   Blood pressure 138/84, pulse 78, temperature 97.7 F (36.5 C), height 5\' 4"  (1.626 m), weight 300 lb (136.1 kg), last menstrual period 05/08/2011, SpO2 97 %. Body mass index is 51.49 kg/m.  General: Cooperative, alert, well developed, in no acute distress. HEENT: Conjunctivae and lids unremarkable. Cardiovascular: Regular rhythm.  Lungs: Normal work of breathing. Neurologic: No focal deficits.   Lab Results  Component Value Date   CREATININE 0.70 08/08/2022   BUN 16 08/08/2022   NA 140 08/08/2022   K 4.3 08/08/2022   CL 104 08/08/2022   CO2 24 08/08/2022   Lab Results  Component Value Date   ALT 18 05/03/2022   AST 23 05/03/2022   ALKPHOS 114 05/03/2022   BILITOT 0.4 05/03/2022   Lab Results  Component Value Date   HGBA1C 5.9 (H) 05/03/2022   HGBA1C 6.0 08/11/2021   HGBA1C 5.7 04/19/2020   HGBA1C 5.7 02/14/2018   HGBA1C 5.6 08/01/2017  Lab Results  Component Value Date   INSULIN 10.5 05/03/2022   Lab Results  Component Value Date   TSH 1.270 05/03/2022   Lab Results  Component Value Date   CHOL 196 05/03/2022   HDL 49 05/03/2022   LDLCALC 128 (H) 05/03/2022   LDLDIRECT 197.8 10/15/2013   TRIG 106 05/03/2022   CHOLHDL 4 08/11/2021    Lab Results  Component Value Date   VD25OH 36.3 05/03/2022   Lab Results  Component Value Date   WBC 9.8 05/03/2022   HGB 13.2 05/03/2022   HCT 40.5 05/03/2022   MCV 83 05/03/2022   PLT 344 05/03/2022   Lab Results  Component Value Date   FERRITIN 65.6 11/25/2010   Attestation Statements:   Reviewed by clinician on day of visit: allergies, medications, problem list, medical history, surgical history, family history, social history, and previous encounter notes.  Time spent on visit including pre-visit chart review and post-visit care and charting was 20 minutes.   I, Kyung Rudd, BS, CMA, am acting as transcriptionist for Worthy Rancher, MD.  I have reviewed the above documentation for accuracy and completeness, and I agree with the above. -  ***

## 2022-10-05 NOTE — Progress Notes (Signed)
Chief Complaint:   OBESITY Sharon Fowler is here to discuss her progress with her obesity treatment plan along with follow-up of her obesity related diagnoses. [name] is on the Category 2 Plan and states she is following her eating plan approximately 15% of the time. [name] states she is walking some 5-10 minutes 7 times per week.  Today's visit was #: 9 Starting weight: 312 LBS Starting date: 05/03/2022 Today's weight: 300 LBS Today's date: 09/25/2022 Total lbs lost to date: 0 Total lbs lost since last in-office visit: 0  Interim History: Patient has been eating out more often for dinner 5 times per week.  She is having breakfast at home oatmeal with peanut butter, granola and sterilize milk.  Lunch, she is having a [place] wrap with cheese monitor TF, laughing cow cheese, eats 1, then another 4 PM snack.  She is eating few fruits and veggies, picky.  She is having 1-2 small snacks while working nights.  She avoids SSB's.  She is not exercising.  Subjective:   1. Vitamin D deficiency Last vitamin D level was 42 on 08/08/2022.  She is on prescription vitamin D 50,000 IU weekly.  2. Essential hypertension She is taking amlodipine 5 mg daily, carvedilol 12.5 mg twice daily, losartan 100 mg daily.  Blood pressure is elevated today but she did not take her morning meds yet.  3. S/P laparoscopic adjustable gastric banding Removed in 2018 due to band erosion.  She has considered VSG or our RYGB, given BMI of 51.   insurance did not cover another bariatric surgery nor does it cover AOM's.  4. Other depression with emotional eating Improving on Wellbutrin SR 150 mg every evening, refill by Dr. Salomon Fick.  She has been practicing mindful eating.  Assessment/Plan:   1. Vitamin D deficiency Has refill of prescription vitamin D weekly to continue.  Recheck level in 3 months.  2. Essential hypertension Monitor all home blood pressures.  Continue all blood pressure medications as prescribed.  3. S/P  laparoscopic adjustable gastric banding Revisional bariatric surgery recommended given lack of progress, BMI 51 and lack of coverage for GLP-1 receptor agonists.  4. Other depression with emotional eating Continue Wellbutrin SR, consider CBT.  5. Obesity,current BMI 51.6 Reviewed overall progress, she is down 3 pounds in 4 months.  Reduce meals out.  Sharon Fowler is currently in the action stage of change. As such, her goal is to continue with weight loss efforts. She has agreed to the Category 2 Plan + 110 protein daily.  Exercise goals:  Increase exercise time to 20 minutes 5-6 times per week  Behavioral modification strategies: increasing lean protein intake, increasing vegetables, increasing water intake, increasing high fiber foods, decreasing eating out, meal planning and cooking strategies, keeping healthy foods in the home, better snacking choices, and avoiding temptations.  Sharon Fowler has agreed to follow-up with our clinic in 4 weeks. She was informed of the importance of frequent follow-up visits to maximize her success with intensive lifestyle modifications for her multiple health conditions.   Objective:   Blood pressure (!) 148/84, pulse 79, temperature 97.7 F (36.5 C), height 5\' 4"  (1.626 m), weight 300 lb (136.1 kg), last menstrual period 05/08/2011, SpO2 99 %. Body mass index is 51.49 kg/m.  General: Cooperative, alert, well developed, in no acute distress. HEENT: Conjunctivae and lids unremarkable. Cardiovascular: Regular rhythm.  Lungs: Normal work of breathing. Neurologic: No focal deficits.   Lab Results  Component Value Date   CREATININE 0.70 08/08/2022  BUN 16 08/08/2022   NA 140 08/08/2022   K 4.3 08/08/2022   CL 104 08/08/2022   CO2 24 08/08/2022   Lab Results  Component Value Date   ALT 18 05/03/2022   AST 23 05/03/2022   ALKPHOS 114 05/03/2022   BILITOT 0.4 05/03/2022   Lab Results  Component Value Date   HGBA1C 5.9 (H) 05/03/2022   HGBA1C 6.0  08/11/2021   HGBA1C 5.7 04/19/2020   HGBA1C 5.7 02/14/2018   HGBA1C 5.6 08/01/2017   Lab Results  Component Value Date   INSULIN 10.5 05/03/2022   Lab Results  Component Value Date   TSH 1.270 05/03/2022   Lab Results  Component Value Date   CHOL 196 05/03/2022   HDL 49 05/03/2022   LDLCALC 128 (H) 05/03/2022   LDLDIRECT 197.8 10/15/2013   TRIG 106 05/03/2022   CHOLHDL 4 08/11/2021   Lab Results  Component Value Date   VD25OH 36.3 05/03/2022   Lab Results  Component Value Date   WBC 9.8 05/03/2022   HGB 13.2 05/03/2022   HCT 40.5 05/03/2022   MCV 83 05/03/2022   PLT 344 05/03/2022   Lab Results  Component Value Date   FERRITIN 65.6 11/25/2010   Attestation Statements:   Reviewed by clinician on day of visit: allergies, medications, problem list, medical history, surgical history, family history, social history, and previous encounter notes.  I, Malcolm Metro, am acting as Energy manager for Seymour Bars, DO.  I have reviewed the above documentation for accuracy and completeness, and I agree with the above. Glennis Brink, DO

## 2022-10-16 ENCOUNTER — Ambulatory Visit (INDEPENDENT_AMBULATORY_CARE_PROVIDER_SITE_OTHER): Payer: No Typology Code available for payment source | Admitting: Physician Assistant

## 2022-11-17 ENCOUNTER — Other Ambulatory Visit: Payer: Self-pay | Admitting: Obstetrics and Gynecology

## 2022-11-17 ENCOUNTER — Other Ambulatory Visit (HOSPITAL_COMMUNITY): Payer: Self-pay

## 2022-11-17 DIAGNOSIS — N3281 Overactive bladder: Secondary | ICD-10-CM

## 2022-11-17 MED ORDER — SOLIFENACIN SUCCINATE 5 MG PO TABS
5.0000 mg | ORAL_TABLET | Freq: Every day | ORAL | 4 refills | Status: DC
  Filled 2022-11-17: qty 30, 30d supply, fill #0

## 2022-11-21 ENCOUNTER — Other Ambulatory Visit (HOSPITAL_COMMUNITY): Payer: Self-pay

## 2022-11-21 ENCOUNTER — Encounter: Payer: Self-pay | Admitting: Obstetrics and Gynecology

## 2022-11-21 ENCOUNTER — Ambulatory Visit: Payer: 59 | Admitting: Obstetrics and Gynecology

## 2022-11-21 VITALS — BP 121/86 | HR 68

## 2022-11-21 DIAGNOSIS — N3281 Overactive bladder: Secondary | ICD-10-CM

## 2022-11-21 DIAGNOSIS — K5904 Chronic idiopathic constipation: Secondary | ICD-10-CM

## 2022-11-21 DIAGNOSIS — R350 Frequency of micturition: Secondary | ICD-10-CM | POA: Diagnosis not present

## 2022-11-21 MED ORDER — SOLIFENACIN SUCCINATE 5 MG PO TABS
5.0000 mg | ORAL_TABLET | Freq: Two times a day (BID) | ORAL | 11 refills | Status: DC
  Filled 2022-11-21 – 2022-12-06 (×3): qty 60, 30d supply, fill #0
  Filled 2022-12-31: qty 60, 30d supply, fill #1
  Filled 2023-01-30: qty 60, 30d supply, fill #2
  Filled 2023-03-01: qty 60, 30d supply, fill #3
  Filled 2023-03-27: qty 60, 30d supply, fill #4
  Filled 2023-04-28: qty 60, 30d supply, fill #5
  Filled 2023-05-28: qty 60, 30d supply, fill #6
  Filled 2023-07-04: qty 60, 30d supply, fill #7
  Filled 2023-08-19: qty 60, 30d supply, fill #8
  Filled 2023-09-26: qty 60, 30d supply, fill #9
  Filled 2023-10-26: qty 60, 30d supply, fill #10

## 2022-11-21 NOTE — Progress Notes (Signed)
[facility] Return Visit  SUBJECTIVE  History of Present Illness: Sharon Fowler is a 52 y.o. female seen in follow-up for OAB. Plan at last visit was Continue vesicare 5mg  x2 daily.   Reports she is well controlled and does not leak when she is taking the medication. Reports she does still have some urgency but it is manageable.   Controlling constipation with laxatives and stool softener x2 daily. She states x3 Bowel movements weekly.   Denies vaginal dryness or pain with intercourse.   Denies leakage with cough and sneeze.  Past Medical History: Patient  has a past medical history of ANEMIA, IRON DEFICIENCY UNSPEC (06/11/2009), Bilateral swelling of feet, Constipation, GOITER, MULTINODULAR (07/15/2009), HYPERLIPIDEMIA (03/25/2008), HYPERTENSION (03/25/2008), OBESITY (03/25/2008), Prediabetes, SOB (shortness of breath), and Swallowing difficulty.   Past Surgical History: She  has a past surgical history that includes Laparoscopic gastric banding (10/30/2009); Cholecystectomy (07/30/1990); Tubal ligation; Cesarean section (7/91, 5/95); Esophagogastroduodenoscopy (egd) with propofol (N/A, 08/16/2017); and LEEP (11/2009).   Medications: She has a current medication list which includes the following prescription(s): amlodipine, aspirin ec, atorvastatin, bupropion, carvedilol, cetirizine hcl, losartan, sennosides-docusate sodium, vitamin d (ergocalciferol), and solifenacin.   Allergies: Patient is allergic to chlorthalidone and lisinopril.   Social History: Patient  reports that she quit smoking about 31 years ago. Her smoking use included cigarettes. She has never used smokeless tobacco. She reports current alcohol use. She reports that she does not use drugs.      OBJECTIVE     Physical Exam: Vitals:   11/21/22 0755  BP: 121/86  Pulse: 68   Gen: No apparent distress, A&O x 3.  Detailed Urogynecologic Evaluation:  Deferred.     ASSESSMENT AND PLAN    Sharon Fowler is a 52 y.o. with:  1. Overactive bladder   2. Urinary frequency   3. Chronic idiopathic constipation    Continue Vesicare 5mg  twice daily. We discussed that this is not a permanent solution as we do not want to leave people on it permanently due to the potential side effects of memory impairment.  Reports she still has some urinary frequency but the urge incontinence is well controlled.  Taking laxatives and stool softeners PRN.   Patient will return in 1 year or sooner if needed.

## 2022-12-04 ENCOUNTER — Other Ambulatory Visit (HOSPITAL_COMMUNITY): Payer: Self-pay

## 2022-12-04 ENCOUNTER — Other Ambulatory Visit: Payer: Self-pay | Admitting: Family Medicine

## 2022-12-04 ENCOUNTER — Other Ambulatory Visit: Payer: Self-pay

## 2022-12-04 DIAGNOSIS — F3289 Other specified depressive episodes: Secondary | ICD-10-CM

## 2022-12-04 MED ORDER — BUPROPION HCL ER (SR) 150 MG PO TB12
150.0000 mg | ORAL_TABLET | Freq: Every day | ORAL | 0 refills | Status: DC
  Filled 2022-12-04: qty 90, 90d supply, fill #0

## 2022-12-06 ENCOUNTER — Other Ambulatory Visit (HOSPITAL_COMMUNITY): Payer: Self-pay

## 2022-12-06 ENCOUNTER — Other Ambulatory Visit: Payer: Self-pay | Admitting: Family Medicine

## 2022-12-06 DIAGNOSIS — E785 Hyperlipidemia, unspecified: Secondary | ICD-10-CM

## 2022-12-11 ENCOUNTER — Other Ambulatory Visit: Payer: Self-pay

## 2022-12-12 ENCOUNTER — Other Ambulatory Visit (HOSPITAL_COMMUNITY): Payer: Self-pay

## 2022-12-12 MED ORDER — ATORVASTATIN CALCIUM 10 MG PO TABS
10.0000 mg | ORAL_TABLET | Freq: Every day | ORAL | 1 refills | Status: DC
  Filled 2022-12-12: qty 90, 90d supply, fill #0
  Filled 2023-03-08: qty 90, 90d supply, fill #1

## 2022-12-18 ENCOUNTER — Encounter: Payer: Self-pay | Admitting: *Deleted

## 2023-01-19 NOTE — Progress Notes (Signed)
Name: Sharon Fowler  MRN/ DOB: [id number], 1971-06-22    Age/ Sex: 52 y.o., female     PCP: Sharon Ruddy, MD   Reason for Endocrinology Evaluation: MNG     Initial Endocrinology Clinic Visit: 06/15/2020    PATIENT IDENTIFIER: [name] is a 52 y.o., female with a past medical history of MNG, HTN and Dyslipidemia . She has followed with [facility] since 06/15/2020 for consultative assistance with management of her MNG.   HISTORICAL SUMMARY: The patient was first diagnosed with MNG in 2015 S/P FNA  LLP 2.5 cm in 2021 with benign cytology    The patient was followed by Dr. Loanne Drilling from 2021 until 12/2021  SUBJECTIVE:    Today (01/22/2023):  Sharon Fowler is here for Pt returns for f/u of MNG   Weight continues to fluctuate Denies any local neck swelling  Denies palpitations  Denies tremors  Denies loose stools or diarrhea    HISTORY:  Past Medical History:  Past Medical History:  Diagnosis Date   ANEMIA, IRON DEFICIENCY UNSPEC 06/11/2009   corrected with iron replacement---menstrual reelated   Bilateral swelling of feet    Constipation    GOITER, MULTINODULAR 07/15/2009   HYPERLIPIDEMIA 03/25/2008   HYPERTENSION 03/25/2008   OBESITY 03/25/2008   Prediabetes    SOB (shortness of breath)    Swallowing difficulty    Past Surgical History:  Past Surgical History:  Procedure Laterality Date   CESAREAN SECTION  7/91, 5/95   x2   CHOLECYSTECTOMY  07/30/1990   laparascopy   ESOPHAGOGASTRODUODENOSCOPY (EGD) WITH PROPOFOL N/A 08/16/2017   Procedure: ESOPHAGOGASTRODUODENOSCOPY (EGD) WITH PROPOFOL;  Surgeon: Alphonsa Overall, MD;  Location: [facility];  Service: General;  Laterality: N/A;   LAPAROSCOPIC GASTRIC BANDING  10/30/2009   LEEP  11/2009   TUBAL LIGATION     01/2003   Social History:  reports that she quit smoking about 32 years ago. Her smoking use included cigarettes. She has never used smokeless tobacco. She reports current alcohol  use. She reports that she does not use drugs. Family History:  Family History  Problem Relation Age of Onset   Alcohol abuse Mother    Hyperlipidemia Mother    Hypertension Mother    Diabetes Mother    Stroke Mother    Drug abuse Mother    Obesity Mother    Drug abuse Father    Diabetes Father    Alcohol abuse Father    Stroke Father    Goiter Neg Hx      HOME MEDICATIONS: Allergies as of 01/22/2023       Reactions   Chlorthalidone Other (See Comments)   REACTION: hypokalemia (2.8)   Lisinopril    cough        Medication List        Accurate as of January 22, 2023  7:38 AM. If you have any questions, ask your nurse or doctor.          amLODipine 5 MG tablet Commonly known as: NORVASC Take 1 tablet (5 mg total) by mouth daily.   aspirin EC 81 MG tablet Take 81 mg by mouth daily. Swallow whole.   atorvastatin 10 MG tablet Commonly known as: LIPITOR Take 1 tablet (10 mg total) by mouth daily.   buPROPion 150 MG 12 hr tablet Commonly known as: Wellbutrin SR Take 1 tablet (150 mg total) by mouth daily.   carvedilol 12.5 MG tablet Commonly known as: COREG Take 1 tablet (  12.5 mg total) by mouth 2 (two) times daily with a meal.   losartan 100 MG tablet Commonly known as: COZAAR Take 1 tablet (100 mg total) by mouth daily.   SENNA S PO Take by mouth.   solifenacin 5 MG tablet Commonly known as: VESICARE Take 1 tablet (5 mg total) by mouth in the morning and at bedtime.   Vitamin D (Ergocalciferol) 1.25 MG (50000 UNIT) Caps capsule Commonly known as: DRISDOL Take 1 capsule (50,000 Units total) by mouth every 7 (seven) days.   ZYRTEC PO Take by mouth.          OBJECTIVE:   PHYSICAL EXAM: VS: BP (!) 128/90   Pulse 74   Ht 5\' 4"  (1.626 m)   Wt (!) 300 lb 9.6 oz (136.4 kg)   LMP 05/08/2011 (Approximate)   SpO2 99%   BMI 51.60 kg/m    EXAM: General: Pt appears well and is in NAD  Eyes: External eye exam normal without stare, lid lag or  exophthalmos.  EOM intact.    Neck: General: Supple without adenopathy. Thyroid: Thyroid size normal.  Left thyroid asymmetry noted  Lungs: Clear with good BS bilat   Heart: Auscultation: RRR.  Extremities:  BL LE: No pretibial edema   Mental Status: Judgment, insight: Intact Orientation: Oriented to time, place, and person Mood and affect: No depression, anxiety, or agitation     DATA REVIEWED: ***  Thyroid Ultrasound 04/26/2022 Narrative & Impression  CLINICAL DATA:  52 year old female with thyroid nodules   EXAM: THYROID ULTRASOUND   TECHNIQUE: Ultrasound examination of the thyroid gland and adjacent soft tissues was performed.   COMPARISON:  04/25/2021   FINDINGS: Parenchymal Echotexture: Moderately heterogenous   Isthmus: 1.0 cm   Right lobe: 5.8 cm x 1.8 cm x 2.6 cm   Left lobe: 6.6 cm x 2.2 cm x 3.1 cm   _________________________________________________________   Estimated total number of nodules >/= 1 cm: 6-10   Number of spongiform nodules >/=  2 cm not described below (TR1): 0   Number of mixed cystic and solid nodules >/= 1.5 cm not described below (TR2): 0   _________________________________________________________   Nodule labeled 1 in the right aspect of the isthmus, 1.2 cm, TR 4. Nodule meets criteria for surveillance.   Nodule labeled 2 right upper thyroid, unchanged in size, estimated 1.1 cm. Nodule has spongiform characteristics and does not meet criteria for surveillance.   Nodule labeled 3, right upper thyroid, 1.1 cm. Nodule has spongiform characteristics and does not meet criteria for further surveillance.   Nodule labeled 4, inferior right thyroid, 7 mm. Nodule does not meet criteria for further surveillance.   Nodule labeled 5, superior left thyroid, 1.5 cm, relatively unchanged, TR 3. Nodule meets criteria for surveillance.   Nodule labeled 6, inferior left thyroid, 2.8 cm. This nodule was previously biopsied 07/07/2020.  Assuming benign result, no further specific follow-up would be indicated.   Nodule labeled 7 inferior left thyroid measuring 0.9 cm. Spongiform characteristics and does not meet criteria for surveillance.   No adenopathy   IMPRESSION: Multinodular thyroid.   Rightward isthmic thyroid nodule (labeled 1, 1.2 cm, TR 4) in the left superior thyroid nodule (labeled 5, TR 3, 1.5 cm) both meet criteria for surveillance, as designated by the newly established ACR TI-RADS criteria. Surveillance ultrasound study recommended to be performed annually up to 5 years.   FNA Left inferior nodule 07/07/2020   Clinical History: Left, Inferior 2.5 cm;  Other 2 dimensions:  1.8 x 1.3 cm, solid/almost completely solid hypoechoic , punctate echogenic foci TI-RADS - 7 Specimen Submitted:  A. THYROID, LLP, FINE NEEDLE ASPIRATION:   FINAL MICROSCOPIC DIAGNOSIS: - Consistent with benign follicular nodule (Bethesda category I   Old records , labs and images have been reviewed.   ASSESSMENT / PLAN / RECOMMENDATIONS:   MNG  - Pt is clinically and biochemical euthyroid  - She is S/P left inferior benign FNA 2021 - Will proceed with repeat thyroid ultrasound this year   F/U in 1 yr     Signed electronically by: Mack Guise, MD  [facility]  [facility] [address] [place], [place] [address] Phone: [phone] FAX: [phone]      CC: Sharon Fowler, [place] [address] Phone: [phone]  Fax: [phone]   Return to Endocrinology clinic as below: Future Appointments  Date Time Provider [place]  04/06/2023  8:00 AM Jaquita Folds, MD [facility]  09/24/2023  8:00 AM Sharon Ruddy, MD [id number]

## 2023-01-22 ENCOUNTER — Encounter: Payer: Self-pay | Admitting: Internal Medicine

## 2023-01-22 ENCOUNTER — Ambulatory Visit: Payer: 59 | Admitting: Internal Medicine

## 2023-01-22 VITALS — BP 128/90 | HR 74 | Ht 64.0 in | Wt 300.6 lb

## 2023-01-22 DIAGNOSIS — E042 Nontoxic multinodular goiter: Secondary | ICD-10-CM | POA: Diagnosis not present

## 2023-01-23 LAB — TSH: TSH: 2.35 u[IU]/mL (ref 0.35–5.50)

## 2023-01-23 LAB — T4, FREE: Free T4: 1.03 ng/dL (ref 0.60–1.60)

## 2023-01-30 ENCOUNTER — Other Ambulatory Visit: Payer: Self-pay

## 2023-02-01 ENCOUNTER — Other Ambulatory Visit (HOSPITAL_COMMUNITY): Payer: Self-pay

## 2023-03-01 ENCOUNTER — Other Ambulatory Visit: Payer: Self-pay

## 2023-03-01 ENCOUNTER — Other Ambulatory Visit (INDEPENDENT_AMBULATORY_CARE_PROVIDER_SITE_OTHER): Payer: Self-pay | Admitting: Internal Medicine

## 2023-03-01 ENCOUNTER — Other Ambulatory Visit (HOSPITAL_COMMUNITY): Payer: Self-pay

## 2023-03-01 DIAGNOSIS — E559 Vitamin D deficiency, unspecified: Secondary | ICD-10-CM

## 2023-03-04 ENCOUNTER — Other Ambulatory Visit: Payer: Self-pay | Admitting: Family Medicine

## 2023-03-04 DIAGNOSIS — F3289 Other specified depressive episodes: Secondary | ICD-10-CM

## 2023-03-05 ENCOUNTER — Other Ambulatory Visit: Payer: Self-pay

## 2023-03-05 ENCOUNTER — Other Ambulatory Visit (HOSPITAL_COMMUNITY): Payer: Self-pay

## 2023-03-05 MED ORDER — BUPROPION HCL ER (SR) 150 MG PO TB12
150.0000 mg | ORAL_TABLET | Freq: Every day | ORAL | 1 refills | Status: DC
  Filled 2023-03-05: qty 90, 90d supply, fill #0
  Filled 2023-06-03: qty 90, 90d supply, fill #1

## 2023-03-06 ENCOUNTER — Other Ambulatory Visit (HOSPITAL_COMMUNITY): Payer: Self-pay

## 2023-03-28 ENCOUNTER — Other Ambulatory Visit (HOSPITAL_COMMUNITY): Payer: Self-pay

## 2023-04-06 ENCOUNTER — Ambulatory Visit: Payer: No Typology Code available for payment source | Admitting: Obstetrics and Gynecology

## 2023-04-23 ENCOUNTER — Telehealth: Payer: Self-pay | Admitting: Family Medicine

## 2023-04-23 DIAGNOSIS — Z111 Encounter for screening for respiratory tuberculosis: Secondary | ICD-10-CM

## 2023-04-23 NOTE — Telephone Encounter (Signed)
Pt call and stated she want to know if dr.Banks do TB blood test also pt want a call back to let her know.

## 2023-04-25 ENCOUNTER — Other Ambulatory Visit (INDEPENDENT_AMBULATORY_CARE_PROVIDER_SITE_OTHER): Payer: 59

## 2023-04-25 DIAGNOSIS — Z111 Encounter for screening for respiratory tuberculosis: Secondary | ICD-10-CM | POA: Diagnosis not present

## 2023-04-25 NOTE — Telephone Encounter (Signed)
Ok

## 2023-04-25 NOTE — Telephone Encounter (Signed)
Lab order placed, pt notified she can come in for the lab.

## 2023-04-25 NOTE — Addendum Note (Signed)
Addended by: Elwin Mocha on: 04/25/2023 03:32 PM   Modules accepted: Orders

## 2023-04-27 ENCOUNTER — Ambulatory Visit
Admission: RE | Admit: 2023-04-27 | Discharge: 2023-04-27 | Disposition: A | Discharge status: Home / Self Care | Payer: 59 | Source: Ambulatory Visit | Attending: Internal Medicine | Admitting: Internal Medicine

## 2023-04-27 DIAGNOSIS — E042 Nontoxic multinodular goiter: Secondary | ICD-10-CM

## 2023-04-27 LAB — QUANTIFERON-TB GOLD PLUS
Mitogen-NIL: >10 IU/mL
NIL: 0.02 IU/mL
QuantiFERON-TB Gold Plus: NEGATIVE
TB1-NIL: 0 IU/mL
TB2-NIL: 0 IU/mL

## 2023-04-30 ENCOUNTER — Other Ambulatory Visit (HOSPITAL_COMMUNITY): Payer: Self-pay

## 2023-05-10 ENCOUNTER — Other Ambulatory Visit: Payer: Self-pay | Admitting: Oncology

## 2023-05-10 DIAGNOSIS — Z006 Encounter for examination for normal comparison and control in clinical research program: Secondary | ICD-10-CM

## 2023-06-04 ENCOUNTER — Other Ambulatory Visit (HOSPITAL_COMMUNITY): Payer: Self-pay

## 2023-06-04 ENCOUNTER — Other Ambulatory Visit: Payer: Self-pay

## 2023-06-04 ENCOUNTER — Other Ambulatory Visit: Payer: Self-pay | Admitting: Family Medicine

## 2023-06-04 DIAGNOSIS — E785 Hyperlipidemia, unspecified: Secondary | ICD-10-CM

## 2023-06-04 MED ORDER — ATORVASTATIN CALCIUM 10 MG PO TABS
10.0000 mg | ORAL_TABLET | Freq: Every day | ORAL | 0 refills | Status: DC
  Filled 2023-06-04: qty 30, 30d supply, fill #0

## 2023-06-06 ENCOUNTER — Other Ambulatory Visit (HOSPITAL_COMMUNITY): Payer: Self-pay

## 2023-07-04 ENCOUNTER — Other Ambulatory Visit (INDEPENDENT_AMBULATORY_CARE_PROVIDER_SITE_OTHER): Payer: Self-pay | Admitting: Internal Medicine

## 2023-07-04 ENCOUNTER — Other Ambulatory Visit: Payer: Self-pay

## 2023-07-04 ENCOUNTER — Other Ambulatory Visit: Payer: Self-pay | Admitting: Family Medicine

## 2023-07-04 DIAGNOSIS — F3289 Other specified depressive episodes: Secondary | ICD-10-CM

## 2023-07-04 DIAGNOSIS — E559 Vitamin D deficiency, unspecified: Secondary | ICD-10-CM

## 2023-07-04 DIAGNOSIS — I10 Essential (primary) hypertension: Secondary | ICD-10-CM

## 2023-07-04 DIAGNOSIS — E785 Hyperlipidemia, unspecified: Secondary | ICD-10-CM

## 2023-07-05 ENCOUNTER — Other Ambulatory Visit (HOSPITAL_COMMUNITY): Payer: Self-pay

## 2023-07-05 MED ORDER — CARVEDILOL 12.5 MG PO TABS
12.5000 mg | ORAL_TABLET | Freq: Two times a day (BID) | ORAL | 0 refills | Status: DC
Start: 2023-07-05 — End: 2023-09-24
  Filled 2023-07-05: qty 90, 45d supply, fill #0

## 2023-07-05 MED ORDER — AMLODIPINE BESYLATE 5 MG PO TABS
5.0000 mg | ORAL_TABLET | Freq: Every day | ORAL | 0 refills | Status: DC
  Filled 2023-07-05: qty 90, 90d supply, fill #0

## 2023-07-05 MED ORDER — BUPROPION HCL ER (SR) 150 MG PO TB12
150.0000 mg | ORAL_TABLET | Freq: Every day | ORAL | 0 refills | Status: DC
  Filled 2023-07-05: qty 90, 90d supply, fill #0

## 2023-07-05 MED ORDER — LOSARTAN POTASSIUM 100 MG PO TABS
100.0000 mg | ORAL_TABLET | Freq: Every day | ORAL | 0 refills | Status: DC
  Filled 2023-07-05: qty 90, 90d supply, fill #0

## 2023-07-05 MED ORDER — ATORVASTATIN CALCIUM 10 MG PO TABS
10.0000 mg | ORAL_TABLET | Freq: Every day | ORAL | 0 refills | Status: DC
  Filled 2023-07-05: qty 90, 90d supply, fill #0

## 2023-07-06 ENCOUNTER — Other Ambulatory Visit: Payer: Self-pay

## 2023-09-04 ENCOUNTER — Other Ambulatory Visit: Payer: 59

## 2023-09-10 ENCOUNTER — Other Ambulatory Visit: Payer: Self-pay | Attending: Oncology

## 2023-09-24 ENCOUNTER — Encounter: Payer: Self-pay | Admitting: Family Medicine

## 2023-09-24 ENCOUNTER — Ambulatory Visit (INDEPENDENT_AMBULATORY_CARE_PROVIDER_SITE_OTHER): Payer: 59 | Admitting: Family Medicine

## 2023-09-24 ENCOUNTER — Other Ambulatory Visit (HOSPITAL_COMMUNITY): Payer: Self-pay

## 2023-09-24 VITALS — BP 130/80 | HR 98 | Temp 98.7°F | Ht 64.0 in | Wt 322.5 lb

## 2023-09-24 DIAGNOSIS — E785 Hyperlipidemia, unspecified: Secondary | ICD-10-CM | POA: Diagnosis not present

## 2023-09-24 DIAGNOSIS — I10 Essential (primary) hypertension: Secondary | ICD-10-CM | POA: Diagnosis not present

## 2023-09-24 DIAGNOSIS — F3289 Other specified depressive episodes: Secondary | ICD-10-CM

## 2023-09-24 DIAGNOSIS — Z1159 Encounter for screening for other viral diseases: Secondary | ICD-10-CM | POA: Diagnosis not present

## 2023-09-24 DIAGNOSIS — R7303 Prediabetes: Secondary | ICD-10-CM | POA: Diagnosis not present

## 2023-09-24 DIAGNOSIS — Z0000 Encounter for general adult medical examination without abnormal findings: Secondary | ICD-10-CM

## 2023-09-24 DIAGNOSIS — Z6841 Body Mass Index (BMI) 40.0 and over, adult: Secondary | ICD-10-CM | POA: Diagnosis not present

## 2023-09-24 DIAGNOSIS — E042 Nontoxic multinodular goiter: Secondary | ICD-10-CM | POA: Diagnosis not present

## 2023-09-24 LAB — CBC WITH DIFFERENTIAL/PLATELET
Basophils Absolute: 0.1 10*3/uL (ref 0.0–0.1)
Basophils Relative: 0.8 % (ref 0.0–3.0)
Eosinophils Absolute: 0.1 10*3/uL (ref 0.0–0.7)
Eosinophils Relative: 2.1 % (ref 0.0–5.0)
HCT: 41.3 % (ref 36.0–46.0)
Hemoglobin: 13.3 g/dL (ref 12.0–15.0)
Lymphocytes Relative: 26.5 % (ref 12.0–46.0)
Lymphs Abs: 1.8 10*3/uL (ref 0.7–4.0)
MCHC: 32.2 g/dL (ref 30.0–36.0)
MCV: 85.3 fL (ref 78.0–100.0)
Monocytes Absolute: 0.4 10*3/uL (ref 0.1–1.0)
Monocytes Relative: 6.2 % (ref 3.0–12.0)
Neutro Abs: 4.3 10*3/uL (ref 1.4–7.7)
Neutrophils Relative %: 64.4 % (ref 43.0–77.0)
Platelets: 341 10*3/uL (ref 150.0–400.0)
RBC: 4.84 Mil/uL (ref 3.87–5.11)
RDW: 15.7 % — ABNORMAL HIGH (ref 11.5–15.5)
WBC: 6.7 10*3/uL (ref 4.0–10.5)

## 2023-09-24 LAB — COMPREHENSIVE METABOLIC PANEL
ALT: 17 U/L (ref 0–35)
AST: 16 U/L (ref 0–37)
Albumin: 4.1 g/dL (ref 3.5–5.2)
Alkaline Phosphatase: 110 U/L (ref 39–117)
BUN: 14 mg/dL (ref 6–23)
CO2: 27 meq/L (ref 19–32)
Calcium: 9.4 mg/dL (ref 8.4–10.5)
Chloride: 104 meq/L (ref 96–112)
Creatinine, Ser: 0.68 mg/dL (ref 0.40–1.20)
GFR: 101.17 mL/min (ref >60.00)
Glucose, Bld: 90 mg/dL (ref 70–99)
Potassium: 4 meq/L (ref 3.5–5.1)
Sodium: 139 meq/L (ref 135–145)
Total Bilirubin: 0.3 mg/dL (ref 0.2–1.2)
Total Protein: 7.3 g/dL (ref 6.0–8.3)

## 2023-09-24 LAB — LIPID PANEL
Cholesterol: 220 mg/dL — ABNORMAL HIGH (ref 0–200)
HDL: 48.6 mg/dL (ref >39.00)
LDL Cholesterol: 145 mg/dL — ABNORMAL HIGH (ref 0–99)
NonHDL: 171.57
Total CHOL/HDL Ratio: 5
Triglycerides: 135 mg/dL (ref 0.0–149.0)
VLDL: 27 mg/dL (ref 0.0–40.0)

## 2023-09-24 LAB — HEMOGLOBIN A1C: Hgb A1c MFr Bld: 6.1 % (ref 4.6–6.5)

## 2023-09-24 LAB — VITAMIN D 25 HYDROXY (VIT D DEFICIENCY, FRACTURES): VITD: 17.82 ng/mL — ABNORMAL LOW (ref 30.00–100.00)

## 2023-09-24 LAB — TSH: TSH: 1.39 u[IU]/mL (ref 0.35–5.50)

## 2023-09-24 LAB — T4, FREE: Free T4: 0.89 ng/dL (ref 0.60–1.60)

## 2023-09-24 MED ORDER — LOSARTAN POTASSIUM 100 MG PO TABS
100.0000 mg | ORAL_TABLET | Freq: Every day | ORAL | 3 refills | Status: DC
  Filled 2023-09-24: qty 90, 90d supply, fill #0
  Filled 2024-03-14: qty 90, 90d supply, fill #1
  Filled 2024-06-28: qty 90, 90d supply, fill #2

## 2023-09-24 MED ORDER — BUPROPION HCL ER (SR) 150 MG PO TB12
150.0000 mg | ORAL_TABLET | Freq: Every day | ORAL | 3 refills | Status: DC
Start: 2023-09-24 — End: 2024-07-31
  Filled 2023-09-24: qty 90, 90d supply, fill #0
  Filled 2024-03-14: qty 90, 90d supply, fill #1

## 2023-09-24 MED ORDER — ATORVASTATIN CALCIUM 10 MG PO TABS
10.0000 mg | ORAL_TABLET | Freq: Every day | ORAL | 3 refills | Status: DC
  Filled 2023-09-24: qty 90, 90d supply, fill #0
  Filled 2024-03-14: qty 90, 90d supply, fill #1
  Filled 2024-06-28: qty 90, 90d supply, fill #2

## 2023-09-24 MED ORDER — AMLODIPINE BESYLATE 5 MG PO TABS
5.0000 mg | ORAL_TABLET | Freq: Every day | ORAL | 3 refills | Status: DC
  Filled 2023-09-24: qty 90, 90d supply, fill #0
  Filled 2024-02-08: qty 90, 90d supply, fill #1
  Filled 2024-04-30: qty 90, 90d supply, fill #2
  Filled 2024-08-11: qty 90, 90d supply, fill #3

## 2023-09-24 MED ORDER — CARVEDILOL 12.5 MG PO TABS
12.5000 mg | ORAL_TABLET | Freq: Two times a day (BID) | ORAL | 3 refills | Status: DC
  Filled 2023-09-24: qty 180, 90d supply, fill #0
  Filled 2024-03-14: qty 180, 90d supply, fill #1

## 2023-09-24 NOTE — Patient Instructions (Signed)
Do not forget to call Dr. Kenna Gilbert office to inquire about follow-up colonoscopy.

## 2023-09-24 NOTE — Progress Notes (Signed)
Established Patient Office Visit   Subjective  Patient ID: Sharon Fowler, female    DOB: [birth year]  Age: 52 y.o. MRN: [id number]  Chief Complaint  Patient presents with   Annual Exam    Patient is a 52 year old female seen for CPE.  Patient states she has been doing well overall.  Notes weight gain since last office visit.  Patient works third shift which makes it difficult to exercise during the day.  Also notes eating less convenient.  Patient seen by weight management in the past.  Also had lap band which was removed.  Patient requesting refills on all meds.  Has mammogram and Pap coming up next month.  Thinks recall colonoscopy due.  Last colonoscopy with Dr. Loreta Ave.  Patient notes night drinking during 12-hour work shift due to history of urinary incontinence.  Patient states currently using the bathroom 1-2 times per shift.    Patient Active Problem List   Diagnosis Date Noted   S/P laparoscopic surgery 09/25/2022   Arthritis of both knees 09/04/2022   Depression 07/18/2022   Pain in both knees 07/18/2022   Vitamin D deficiency 05/30/2022   Prediabetes 05/30/2022   Gastric band erosion 08/20/2017   Proteinuria 05/26/2017   Non-intractable vomiting with nausea 05/26/2017   Acute left-sided thoracic back pain 05/26/2017   Flank pain 05/26/2017   GOITER, MULTINODULAR 07/15/2009   ANEMIA, IRON DEFICIENCY UNSPEC 06/11/2009   HLD (hyperlipidemia) 03/25/2008   OBESITY 03/25/2008   Essential hypertension 03/25/2008   Past Medical History:  Diagnosis Date   ANEMIA, IRON DEFICIENCY UNSPEC 06/11/2009   corrected with iron replacement---menstrual reelated   Bilateral swelling of feet    Constipation    GOITER, MULTINODULAR 07/15/2009   HYPERLIPIDEMIA 03/25/2008   HYPERTENSION 03/25/2008   OBESITY 03/25/2008   Prediabetes    SOB (shortness of breath)    Swallowing difficulty    Past Surgical History:  Procedure Laterality Date   CESAREAN SECTION  7/91, 5/95   x2    CHOLECYSTECTOMY  07/30/1990   laparascopy   ESOPHAGOGASTRODUODENOSCOPY (EGD) WITH PROPOFOL N/A 08/16/2017   Procedure: ESOPHAGOGASTRODUODENOSCOPY (EGD) WITH PROPOFOL;  Surgeon: Ovidio Kin, MD;  Location: [facility];  Service: General;  Laterality: N/A;   LAPAROSCOPIC GASTRIC BANDING  10/30/2009   LEEP  11/2009   TUBAL LIGATION     01/2003   Social History   Tobacco Use   Smoking status: Former    Current packs/day: 0.00    Types: Cigarettes    Quit date: 01/02/1991    Years since quitting: 32.7   Smokeless tobacco: Never  Vaping Use   Vaping status: Never Used  Substance Use Topics   Alcohol use: Yes    Alcohol/week: 0.0 standard drinks of alcohol    Comment: occ   Drug use: No   Family History  Problem Relation Age of Onset   Alcohol abuse Mother    Hyperlipidemia Mother    Hypertension Mother    Diabetes Mother    Stroke Mother    Drug abuse Mother    Obesity Mother    Drug abuse Father    Diabetes Father    Alcohol abuse Father    Stroke Father    Goiter Neg Hx    Allergies  Allergen Reactions   Chlorthalidone Other (See Comments)    REACTION: hypokalemia (2.8)   Lisinopril     cough      ROS Negative unless stated above    Objective:  BP 130/80 (BP Location: Right Arm, Patient Position: Sitting, Cuff Size: Large)   Pulse 98   Temp 98.7 F (37.1 C) (Oral)   Ht 5\' 4"  (1.626 m)   Wt (!) 322 lb 8 oz (146.3 kg)   LMP 05/08/2011 (Approximate)   SpO2 96%   BMI 55.36 kg/m  BP Readings from Last 3 Encounters:  09/24/23 130/80  01/22/23 (!) 128/90  11/21/22 121/86   Wt Readings from Last 3 Encounters:  09/24/23 (!) 322 lb 8 oz (146.3 kg)  01/22/23 (!) 300 lb 9.6 oz (136.4 kg)  09/25/22 300 lb (136.1 kg)      Physical Exam Constitutional:      Appearance: Normal appearance. She is obese.  HENT:     Head: Normocephalic and atraumatic.     Right Ear: Tympanic membrane, ear canal and external ear normal.     Left Ear: Tympanic membrane,  ear canal and external ear normal.     Nose: Nose normal.     Mouth/Throat:     Mouth: Mucous membranes are moist.     Pharynx: No oropharyngeal exudate or posterior oropharyngeal erythema.  Eyes:     General: No scleral icterus.    Extraocular Movements: Extraocular movements intact.     Conjunctiva/sclera: Conjunctivae normal.     Pupils: Pupils are equal, round, and reactive to light.  Neck:     Thyroid: Thyroid mass and thyromegaly present.     Comments: Multinodular goiter. Cardiovascular:     Rate and Rhythm: Normal rate and regular rhythm.     Pulses: Normal pulses.     Heart sounds: Normal heart sounds. No murmur heard.    No friction rub.  Pulmonary:     Effort: Pulmonary effort is normal.     Breath sounds: Normal breath sounds. No wheezing, rhonchi or rales.  Abdominal:     General: Bowel sounds are normal.     Palpations: Abdomen is soft.     Tenderness: There is no abdominal tenderness.  Musculoskeletal:        General: No deformity. Normal range of motion.  Lymphadenopathy:     Cervical: No cervical adenopathy.  Skin:    General: Skin is warm and dry.     Findings: No lesion.  Neurological:     General: No focal deficit present.     Mental Status: She is alert and oriented to person, place, and time.  Psychiatric:        Mood and Affect: Mood normal.        Thought Content: Thought content normal.       09/24/2023    8:08 AM 09/20/2022   10:56 AM 08/30/2022   11:20 AM  Depression screen PHQ 2/9  Decreased Interest 0 0 0  Down, Depressed, Hopeless 0  0  PHQ - 2 Score 0 0 0  Altered sleeping  0 0  Tired, decreased energy  1 1  Change in appetite  1 0  Feeling bad or failure about yourself   0 0  Trouble concentrating  0 0  Moving slowly or fidgety/restless  0 0  Suicidal thoughts  0 0  PHQ-9 Score  2 1  Difficult doing work/chores  Not difficult at all Not difficult at all  \    05/14/2020    9:21 AM 04/15/2020    9:28 AM  GAD 7 : Generalized  Anxiety Score  Nervous, Anxious, on Edge 2 0  Control/stop worrying 0 0  Worry too much -  different things 0 0  Trouble relaxing 0 0  Restless 0 0  Easily annoyed or irritable 0 0  Afraid - awful might happen 0 0  Total GAD 7 Score 2 0  Anxiety Difficulty Not difficult at all Not difficult at all      No results found for any visits on 09/24/23.    Assessment & Plan:  Well adult exam -Age-appropriate health screenings discussed -Obtain labs -Mammogram and Pap to be done next month per patient -Immunizations reviewed.  Tdap due 01/2024.  Patient to schedule nurses visit for this.  Consider Shingrix -Patient to contact Dr. Kenna Gilbert office regarding colon cancer screening recall -Next CPE in 1 year -     CBC with Differential/Platelet; Future  Essential hypertension -Controlled -Continue lifestyle modifications -Monitor BP at home -     amLODIPine Besylate; Take 1 tablet (5 mg total) by mouth daily.  Dispense: 90 tablet; Refill: 3 -     Carvedilol; Take 1 tablet (12.5 mg total) by mouth 2 (two) times daily with a meal.  Dispense: 90 tablet; Refill: 3 -     Losartan Potassium; Take 1 tablet (100 mg total) by mouth daily.  Dispense: 90 tablet; Refill: 3 -     Comprehensive metabolic panel  Hyperlipidemia, unspecified hyperlipidemia type -     Lipid panel -     Atorvastatin Calcium; Take 1 tablet (10 mg total) by mouth daily.  Dispense: 90 tablet; Refill: 3 -     Comprehensive metabolic panel  Other depression,with emotional eating -PHQ-2 score 0 -GAD-7 score 2 -Continue self-care -Continue Wellbutrin XL 150 mg daily -     buPROPion HCl ER (SR); Take 1 tablet (150 mg total) by mouth daily.  Dispense: 90 tablet; Refill: 3  Prediabetes -Hemoglobin A1c 5.9% on 05/03/2022 -     Hemoglobin A1c  GOITER, MULTINODULAR -Stable -Yearly ultrasounds -     TSH -     T4, free  Encounter for hepatitis C screening test for low risk patient -     Hepatitis C antibody  Morbid obesity  (HCC) -Body mass index is 55.36 kg/m. -Discussed lifestyle modifications including increasing physical activity and meal prep -Consider new referral to weight management -     TSH -     T4, free -     Hemoglobin A1c -     Lipid panel -     Comprehensive metabolic panel -     VITAMIN D 25 Hydroxy (Vit-D Deficiency, Fractures)   Return in about 4 months (around 01/22/2024).   Deeann Saint, MD

## 2023-09-25 LAB — HEPATITIS C ANTIBODY: Hepatitis C Ab: NONREACTIVE

## 2023-10-01 ENCOUNTER — Other Ambulatory Visit (HOSPITAL_COMMUNITY): Payer: Self-pay

## 2023-10-03 ENCOUNTER — Other Ambulatory Visit (HOSPITAL_COMMUNITY): Payer: Self-pay

## 2023-10-03 ENCOUNTER — Other Ambulatory Visit: Payer: Self-pay | Admitting: Family Medicine

## 2023-10-03 DIAGNOSIS — E559 Vitamin D deficiency, unspecified: Secondary | ICD-10-CM

## 2023-10-03 MED ORDER — VITAMIN D (ERGOCALCIFEROL) 1.25 MG (50000 UNIT) PO CAPS
50000.0000 [IU] | ORAL_CAPSULE | ORAL | 0 refills | Status: DC
  Filled 2023-10-03: qty 12, 84d supply, fill #0

## 2023-10-19 DIAGNOSIS — Z1211 Encounter for screening for malignant neoplasm of colon: Secondary | ICD-10-CM | POA: Diagnosis not present

## 2023-10-19 DIAGNOSIS — Z124 Encounter for screening for malignant neoplasm of cervix: Secondary | ICD-10-CM | POA: Diagnosis not present

## 2023-10-19 DIAGNOSIS — Z01419 Encounter for gynecological examination (general) (routine) without abnormal findings: Secondary | ICD-10-CM | POA: Diagnosis not present

## 2023-10-19 DIAGNOSIS — Z1231 Encounter for screening mammogram for malignant neoplasm of breast: Secondary | ICD-10-CM | POA: Diagnosis not present

## 2023-10-19 DIAGNOSIS — Z1339 Encounter for screening examination for other mental health and behavioral disorders: Secondary | ICD-10-CM | POA: Diagnosis not present

## 2023-10-19 DIAGNOSIS — Z9889 Other specified postprocedural states: Secondary | ICD-10-CM | POA: Diagnosis not present

## 2023-11-02 ENCOUNTER — Other Ambulatory Visit (HOSPITAL_COMMUNITY): Payer: Self-pay

## 2023-11-02 ENCOUNTER — Encounter: Payer: Self-pay | Admitting: Obstetrics and Gynecology

## 2023-11-02 ENCOUNTER — Ambulatory Visit: Payer: 59 | Admitting: Obstetrics and Gynecology

## 2023-11-02 VITALS — BP 151/84 | HR 78

## 2023-11-02 DIAGNOSIS — N3281 Overactive bladder: Secondary | ICD-10-CM | POA: Diagnosis not present

## 2023-11-02 DIAGNOSIS — R350 Frequency of micturition: Secondary | ICD-10-CM | POA: Diagnosis not present

## 2023-11-02 MED ORDER — TROSPIUM CHLORIDE ER 60 MG PO CP24
60.0000 mg | ORAL_CAPSULE | Freq: Every day | ORAL | 5 refills | Status: DC
  Filled 2023-11-02 – 2023-12-06 (×5): qty 30, 30d supply, fill #0

## 2023-11-02 NOTE — Patient Instructions (Addendum)
 Stop the vesicare and start the Trospium at night time. Try to track how many times you are going to the bathroom and how severe your leakage is.   Track for 3 days the number of times you go to the bathroom and your urgency

## 2023-11-02 NOTE — Progress Notes (Signed)
 [facility] Return Visit  SUBJECTIVE  History of Present Illness: Sharon Fowler is a 53 y.o. female seen in follow-up for OAB. Plan at last visit was patient has been on Vesicare  for the last year and reports that she is still having significant leakage and overactive bladder symptoms.  She feels very self-conscious at work and fears leaking of herself in front of coworkers.  She does not have significant stress urinary incontinence.  Patient has failed Myrbetriq 25 mg daily previously as it caused significant increase in hypertension.  At this point I would consider this a failure of the Vesicare  as she is also previously failed oxybutynin .    Past Medical History: Patient  has a past medical history of ANEMIA, IRON DEFICIENCY UNSPEC (06/11/2009), Bilateral swelling of feet, Constipation, GOITER, MULTINODULAR (07/15/2009), HYPERLIPIDEMIA (03/25/2008), HYPERTENSION (03/25/2008), OBESITY (03/25/2008), Prediabetes, SOB (shortness of breath), and Swallowing difficulty.   Past Surgical History: She  has a past surgical history that includes Laparoscopic gastric banding (10/30/2009); Cholecystectomy (07/30/1990); Tubal ligation; Cesarean section (7/91, 5/95); Esophagogastroduodenoscopy (egd) with propofol  (N/A, 08/16/2017); and LEEP (11/2009).   Medications: She has a current medication list which includes the following prescription(s): amlodipine , aspirin ec, atorvastatin , bupropion , carvedilol , cetirizine hcl, losartan , sennosides-docusate sodium, trospium  chloride, and vitamin d  (ergocalciferol ).   Allergies: Patient is allergic to chlorthalidone and lisinopril .   Social History: Patient  reports that she quit smoking about 32 years ago. Her smoking use included cigarettes. She has never used smokeless tobacco. She reports current alcohol use. She reports that she does not use drugs.     OBJECTIVE     Physical Exam: Vitals:   11/02/23 1107 11/02/23 1126  BP: (!) 144/85  (!) 151/84  Pulse: 76 78   Gen: No apparent distress, A&O x 3.  Detailed Urogynecologic Evaluation:  Deferred.    ASSESSMENT AND PLAN    Ms. Haft is a 53 y.o. with:  1. Overactive bladder   2. Urinary frequency     Would prefer patient be on medication that does not cross blood-brain barrier or on Gemtesa .  Gemtesa  is estimated to be over $100 which is not affordable for patient.  Would suggest trying trospium  60 mg ER daily as this does not cross the blood-brain barrier and is safer for her patient.  The goal would be that her urgency is reduced the point where she feels she can work more adequately and not have disruptions in her workflow. Trospium  60 mg ER daily sent to pharmacy for patient.  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  Patient to follow-up in 6 months or sooner if needed  Justyna Timoney G Fayne Mcguffee, NP

## 2023-11-06 ENCOUNTER — Other Ambulatory Visit (HOSPITAL_COMMUNITY): Payer: Self-pay

## 2023-12-01 ENCOUNTER — Other Ambulatory Visit (HOSPITAL_COMMUNITY): Payer: Self-pay

## 2023-12-01 ENCOUNTER — Telehealth: Payer: Commercial Managed Care - PPO | Admitting: Physician Assistant

## 2023-12-01 DIAGNOSIS — J069 Acute upper respiratory infection, unspecified: Secondary | ICD-10-CM | POA: Diagnosis not present

## 2023-12-01 MED ORDER — PREDNISONE 20 MG PO TABS
20.0000 mg | ORAL_TABLET | Freq: Two times a day (BID) | ORAL | 0 refills | Status: DC
  Filled 2023-12-01: qty 10, 5d supply, fill #0

## 2023-12-01 MED ORDER — PREDNISONE 20 MG PO TABS: 20.0000 mg | ORAL_TABLET | Freq: Two times a day (BID) | ORAL | 0 refills | Status: DC

## 2023-12-01 MED ORDER — BENZONATATE 200 MG PO CAPS: 200.0000 mg | ORAL_CAPSULE | Freq: Two times a day (BID) | ORAL | 0 refills | Status: DC | PRN

## 2023-12-01 MED ORDER — BENZONATATE 200 MG PO CAPS
200.0000 mg | ORAL_CAPSULE | Freq: Two times a day (BID) | ORAL | 0 refills | Status: DC | PRN
  Filled 2023-12-01: qty 20, 10d supply, fill #0

## 2023-12-01 NOTE — Progress Notes (Signed)
E-Visit for Cough   We are sorry that you are not feeling well.  Here is how we plan to help!  Based on your presentation I believe you most likely have A cough due to a virus.  This is called viral bronchitis and is best treated by rest, plenty of fluids and control of the cough.  You may use Ibuprofen or Tylenol as directed to help your symptoms.     In addition you may use A prescription cough medication called Tessalon Perles 100mg . You may take 1-2 capsules every 8 hours as needed for your cough.  I will also send prednisone.   From your responses in the eVisit questionnaire you describe inflammation in the upper respiratory tract which is causing a significant cough.  This is commonly called Bronchitis and has four common causes:   Allergies Viral Infections Acid Reflux Bacterial Infection Allergies, viruses and acid reflux are treated by controlling symptoms or eliminating the cause. An example might be a cough caused by taking certain blood pressure medications. You stop the cough by changing the medication. Another example might be a cough caused by acid reflux. Controlling the reflux helps control the cough.  USE OF BRONCHODILATOR ("RESCUE") INHALERS: There is a risk from using your bronchodilator too frequently.  The risk is that over-reliance on a medication which only relaxes the muscles surrounding the breathing tubes can reduce the effectiveness of medications prescribed to reduce swelling and congestion of the tubes themselves.  Although you feel brief relief from the bronchodilator inhaler, your asthma may actually be worsening with the tubes becoming more swollen and filled with mucus.  This can delay other crucial treatments, such as oral steroid medications. If you need to use a bronchodilator inhaler daily, several times per day, you should discuss this with your provider.  There are probably better treatments that could be used to keep your asthma under control.     HOME  CARE Only take medications as instructed by your medical team. Complete the entire course of an antibiotic. Drink plenty of fluids and get plenty of rest. Avoid close contacts especially the very young and the elderly Cover your mouth if you cough or cough into your sleeve. Always remember to wash your hands A steam or ultrasonic humidifier can help congestion.   GET HELP RIGHT AWAY IF: You develop worsening fever. You become short of breath You cough up blood. Your symptoms persist after you have completed your treatment plan MAKE SURE YOU  Understand these instructions. Will watch your condition. Will get help right away if you are not doing well or get worse.    Thank you for choosing an e-visit.  Your e-visit answers were reviewed by a board certified advanced clinical practitioner to complete your personal care plan. Depending upon the condition, your plan could have included both over the counter or prescription medications.  Please review your pharmacy choice. Make sure the pharmacy is open so you can pick up prescription now. If there is a problem, you may contact your provider through [employer] and have the prescription routed to another pharmacy.  Your safety is important to [place]. If you have drug allergies check your prescription carefully.   For the next 24 hours you can use MyChart to ask questions about today's visit, request a non-urgent call back, or ask for a work or school excuse. You will get an email in the next two days asking about your experience. I hope that your e-visit has been  valuable and will speed your recovery.    have provided 5 minutes of non face to face time during this encounter for chart review and documentation.

## 2023-12-01 NOTE — Addendum Note (Signed)
Addended by: Georgana Curio on: 12/01/2023 08:40 AM   Modules accepted: Orders

## 2023-12-04 ENCOUNTER — Other Ambulatory Visit (HOSPITAL_COMMUNITY): Payer: Self-pay

## 2023-12-04 DIAGNOSIS — R1310 Dysphagia, unspecified: Secondary | ICD-10-CM | POA: Diagnosis not present

## 2023-12-04 DIAGNOSIS — K5900 Constipation, unspecified: Secondary | ICD-10-CM | POA: Diagnosis not present

## 2023-12-04 DIAGNOSIS — Z1211 Encounter for screening for malignant neoplasm of colon: Secondary | ICD-10-CM | POA: Diagnosis not present

## 2023-12-04 DIAGNOSIS — K219 Gastro-esophageal reflux disease without esophagitis: Secondary | ICD-10-CM | POA: Diagnosis not present

## 2023-12-05 ENCOUNTER — Telehealth: Payer: Commercial Managed Care - PPO | Admitting: Physician Assistant

## 2023-12-05 ENCOUNTER — Other Ambulatory Visit (HOSPITAL_COMMUNITY): Payer: Self-pay

## 2023-12-05 DIAGNOSIS — J208 Acute bronchitis due to other specified organisms: Secondary | ICD-10-CM

## 2023-12-05 MED ORDER — ALBUTEROL SULFATE HFA 108 (90 BASE) MCG/ACT IN AERS
1.0000 | INHALATION_SPRAY | Freq: Four times a day (QID) | RESPIRATORY_TRACT | 0 refills | Status: DC | PRN
  Filled 2023-12-05: qty 6.7, 16d supply, fill #0

## 2023-12-05 MED ORDER — BENZONATATE 100 MG PO CAPS
100.0000 mg | ORAL_CAPSULE | Freq: Three times a day (TID) | ORAL | 0 refills | Status: DC | PRN
  Filled 2023-12-05: qty 30, 5d supply, fill #0

## 2023-12-05 NOTE — Progress Notes (Signed)
 E-Visit for Cough   We are sorry that you are not feeling well.  Here is how we plan to help!  Based on your presentation I believe you most likely have A cough due to a virus.  This is called viral bronchitis and is best treated by rest, plenty of fluids and control of the cough.  You may use Ibuprofen or Tylenol as directed to help your symptoms.     In addition you may use A non-prescription cough medication called Mucinex DM: take 2 tablets every 12 hours. and A prescription cough medication called Tessalon Perles 100mg . You may take 1-2 capsules every 8 hours as needed for your cough.  I have also prescribed Albuterol inhaler Use 1-2 puffs every 6 hours as needed for shortness of breath, chest tightness, and/or wheezing.  From your responses in the eVisit questionnaire you describe inflammation in the upper respiratory tract which is causing a significant cough.  This is commonly called Bronchitis and has four common causes:   Allergies Viral Infections Acid Reflux Bacterial Infection Allergies, viruses and acid reflux are treated by controlling symptoms or eliminating the cause. An example might be a cough caused by taking certain blood pressure medications. You stop the cough by changing the medication. Another example might be a cough caused by acid reflux. Controlling the reflux helps control the cough.  USE OF BRONCHODILATOR ("RESCUE") INHALERS: There is a risk from using your bronchodilator too frequently.  The risk is that over-reliance on a medication which only relaxes the muscles surrounding the breathing tubes can reduce the effectiveness of medications prescribed to reduce swelling and congestion of the tubes themselves.  Although you feel brief relief from the bronchodilator inhaler, your asthma may actually be worsening with the tubes becoming more swollen and filled with mucus.  This can delay other crucial treatments, such as oral steroid medications. If you need to use a  bronchodilator inhaler daily, several times per day, you should discuss this with your provider.  There are probably better treatments that could be used to keep your asthma under control.     HOME CARE Only take medications as instructed by your medical team. Complete the entire course of an antibiotic. Drink plenty of fluids and get plenty of rest. Avoid close contacts especially the very young and the elderly Cover your mouth if you cough or cough into your sleeve. Always remember to wash your hands A steam or ultrasonic humidifier can help congestion.   GET HELP RIGHT AWAY IF: You develop worsening fever. You become short of breath You cough up blood. Your symptoms persist after you have completed your treatment plan MAKE SURE YOU  Understand these instructions. Will watch your condition. Will get help right away if you are not doing well or get worse.    Thank you for choosing an e-visit.  Your e-visit answers were reviewed by a board certified advanced clinical practitioner to complete your personal care plan. Depending upon the condition, your plan could have included both over the counter or prescription medications.  Please review your pharmacy choice. Make sure the pharmacy is open so you can pick up prescription now. If there is a problem, you may contact your provider through [employer] and have the prescription routed to another pharmacy.  Your safety is important to [place]. If you have drug allergies check your prescription carefully.   For the next 24 hours you can use MyChart to ask questions about today's visit, request a non-urgent call  back, or ask for a work or school excuse. You will get an email in the next two days asking about your experience. I hope that your e-visit has been valuable and will speed your recovery.   I have spent 5 minutes in review of e-visit questionnaire, review and updating patient chart, medical decision making and response to patient.    Margaretann Loveless, PA-C

## 2023-12-06 ENCOUNTER — Encounter: Payer: Self-pay | Admitting: Obstetrics and Gynecology

## 2023-12-06 ENCOUNTER — Other Ambulatory Visit: Payer: Self-pay | Admitting: Obstetrics and Gynecology

## 2023-12-06 ENCOUNTER — Other Ambulatory Visit (HOSPITAL_COMMUNITY): Payer: Self-pay

## 2023-12-06 MED ORDER — TROSPIUM CHLORIDE 20 MG PO TABS
20.0000 mg | ORAL_TABLET | Freq: Two times a day (BID) | ORAL | 5 refills | Status: DC
  Filled 2023-12-06: qty 60, 30d supply, fill #0
  Filled 2024-02-08: qty 60, 30d supply, fill #1
  Filled 2024-03-14: qty 60, 30d supply, fill #2
  Filled 2024-04-30: qty 60, 30d supply, fill #3
  Filled 2024-06-28: qty 60, 30d supply, fill #4

## 2023-12-10 NOTE — Telephone Encounter (Signed)
 Patient medication was sent to pharmacy

## 2023-12-11 DIAGNOSIS — Z1211 Encounter for screening for malignant neoplasm of colon: Secondary | ICD-10-CM | POA: Diagnosis not present

## 2023-12-11 DIAGNOSIS — Z1212 Encounter for screening for malignant neoplasm of rectum: Secondary | ICD-10-CM | POA: Diagnosis not present

## 2023-12-13 LAB — COLOGUARD: Cologuard: NEGATIVE

## 2023-12-18 LAB — EXTERNAL GENERIC LAB PROCEDURE: COLOGUARD: NEGATIVE

## 2023-12-18 LAB — COLOGUARD: COLOGUARD: NEGATIVE

## 2023-12-26 ENCOUNTER — Encounter: Payer: Self-pay | Admitting: Family Medicine

## 2023-12-26 ENCOUNTER — Telehealth: Payer: Commercial Managed Care - PPO | Admitting: Family Medicine

## 2023-12-26 ENCOUNTER — Other Ambulatory Visit (HOSPITAL_COMMUNITY): Payer: Self-pay

## 2023-12-26 VITALS — BP 151/102

## 2023-12-26 DIAGNOSIS — Z0279 Encounter for issue of other medical certificate: Secondary | ICD-10-CM

## 2023-12-26 DIAGNOSIS — R112 Nausea with vomiting, unspecified: Secondary | ICD-10-CM | POA: Diagnosis not present

## 2023-12-26 MED ORDER — ONDANSETRON HCL 4 MG PO TABS
4.0000 mg | ORAL_TABLET | Freq: Three times a day (TID) | ORAL | 0 refills | Status: DC | PRN
  Filled 2023-12-26: qty 20, 7d supply, fill #0

## 2023-12-26 NOTE — Progress Notes (Signed)
 Virtual Visit via Video Note  I connected with Sharon Fowler on 12/26/23 at  4:30 PM EST by a video enabled telemedicine application and verified that I am speaking with the correct person using two identifiers.  Location patient: home Location provider:work or home office Persons participating in the virtual visit: patient, provider  I discussed the limitations of evaluation and management by telemedicine and the availability of in person appointments. The patient expressed understanding and agreed to proceed. Chief Complaint  Patient presents with   Gastroesophageal Reflux    Started 2/22 after eating take out, tired, Vomiting and nausea, patient denies and fever or chills or Diarrhea    HPI: Patient is a 53 year old female seen for acute concern.  Pt developed emesis Sat night (12/22/23)/Sun am (12/23/23) immediately after eating part of a burrito from [employer].  Pt states she was unable to finish eating due to symptoms.  Emesis resolved on Sunday, but pt has been unable to eat.  Endorses nausea and fatigue.  Tried drinking ginger ale.  Denies hematemesis, loose stools., fever, chills.  Has been out of work x 4 days.  FMLA form faxed to clinic.  Pt's daughter also had the same food  but symptoms were not as bad.  Pt had a lap band placed 2017, but was removed in 2018.  ROS: See pertinent positives and negatives per HPI.  Past Medical History:  Diagnosis Date   ANEMIA, IRON DEFICIENCY UNSPEC 06/11/2009   corrected with iron replacement---menstrual reelated   Bilateral swelling of feet    Constipation    GOITER, MULTINODULAR 07/15/2009   HYPERLIPIDEMIA 03/25/2008   HYPERTENSION 03/25/2008   OBESITY 03/25/2008   Prediabetes    SOB (shortness of breath)    Swallowing difficulty     Past Surgical History:  Procedure Laterality Date   CESAREAN SECTION  7/91, 5/95   x2   CHOLECYSTECTOMY  07/30/1990   laparascopy   ESOPHAGOGASTRODUODENOSCOPY (EGD) WITH PROPOFOL N/A 08/16/2017    Procedure: ESOPHAGOGASTRODUODENOSCOPY (EGD) WITH PROPOFOL;  Surgeon: Ovidio Kin, MD;  Location: [facility];  Service: General;  Laterality: N/A;   LAPAROSCOPIC GASTRIC BANDING  10/30/2009   LEEP  11/2009   TUBAL LIGATION     01/2003    Family History  Problem Relation Age of Onset   Alcohol abuse Mother    Hyperlipidemia Mother    Hypertension Mother    Diabetes Mother    Stroke Mother    Drug abuse Mother    Obesity Mother    Drug abuse Father    Diabetes Father    Alcohol abuse Father    Stroke Father    Goiter Neg Hx      Current Outpatient Medications:    albuterol (VENTOLIN HFA) 108 (90 Base) MCG/ACT inhaler, Inhale 1-2 puffs into the lungs every 6 (six) hours as needed., Disp: 6.7 g, Rfl: 0   amLODipine (NORVASC) 5 MG tablet, Take 1 tablet (5 mg total) by mouth daily., Disp: 90 tablet, Rfl: 3   aspirin 81 MG EC tablet, Take 81 mg by mouth daily. Swallow whole., Disp: , Rfl:    atorvastatin (LIPITOR) 10 MG tablet, Take 1 tablet (10 mg total) by mouth daily., Disp: 90 tablet, Rfl: 3   buPROPion (WELLBUTRIN SR) 150 MG 12 hr tablet, Take 1 tablet (150 mg total) by mouth daily., Disp: 90 tablet, Rfl: 3   carvedilol (COREG) 12.5 MG tablet, Take 1 tablet (12.5 mg total) by mouth 2 (two) times daily with a meal., Disp: 90  tablet, Rfl: 3   Cetirizine HCl (ZYRTEC PO), Take by mouth., Disp: , Rfl:    losartan (COZAAR) 100 MG tablet, Take 1 tablet (100 mg total) by mouth daily., Disp: 90 tablet, Rfl: 3   Sennosides-Docusate Sodium (SENNA S PO), Take by mouth., Disp: , Rfl:    trospium (SANCTURA) 20 MG tablet, Take 1 tablet (20 mg total) by mouth 2 (two) times daily., Disp: 60 tablet, Rfl: 5   Vitamin D, Ergocalciferol, (DRISDOL) 1.25 MG (50000 UNIT) CAPS capsule, Take 1 capsule (50,000 Units total) by mouth every 7 (seven) days., Disp: 12 capsule, Rfl: 0  EXAM:  VITALS per patient if applicable:  RR 12-20 bpm  GENERAL: alert, oriented, appears ill and in no acute  distress  HEENT: atraumatic, conjunctiva clear, no obvious abnormalities on inspection of external nose and ears  NECK: normal movements of the head and neck  LUNGS: on inspection no signs of respiratory distress, breathing rate appears normal, no obvious gross SOB, gasping or wheezing  CV: no obvious cyanosis  MS: moves all visible extremities without noticeable abnormality  PSYCH/NEURO: pleasant and cooperative, no obvious depression or anxiety, speech and thought processing grossly intact  ASSESSMENT AND PLAN:  Discussed the following assessment and plan:  Nausea and vomiting, unspecified vomiting type - Plan: ondansetron (ZOFRAN) 4 MG tablet  Pt with acute GI symptoms. Emesis resolved.  Continued nausea.  Rx for zofran sent to pharmacy.  Pt advised to try bland diet, advance as tolerated.  FMLA form to be sent. Given strict precautions.  F/u prn. For continued or worsening symptoms follow-up with GI.   I discussed the assessment and treatment plan with the patient. The patient was provided an opportunity to ask questions and all were answered. The patient agreed with the plan and demonstrated an understanding of the instructions.   The patient was advised to call back or seek an in-person evaluation if the symptoms worsen or if the condition fails to improve as anticipated.   Deeann Saint, MD

## 2023-12-27 ENCOUNTER — Encounter: Payer: Self-pay | Admitting: Family Medicine

## 2024-01-03 ENCOUNTER — Telehealth: Payer: Self-pay

## 2024-01-03 NOTE — Telephone Encounter (Signed)
 [facility] paperwork has been filled out and Faxed, patient is aware paperwork is at the front desk to be picked up

## 2024-01-21 NOTE — Progress Notes (Unsigned)
 Name: Sharon Fowler  MRN/ DOB: [id number], 24-Oct-1971    Age/ Sex: 53 y.o., female     PCP: Deeann Saint, MD   Reason for Endocrinology Evaluation: MNG     Initial Endocrinology Clinic Visit: 06/15/2020    PATIENT IDENTIFIER: [name] is a 53 y.o., female with a past medical history of MNG, HTN and Dyslipidemia . She has followed with [facility] since 06/15/2020 for consultative assistance with management of her MNG.   HISTORICAL SUMMARY: The patient was first diagnosed with MNG in 2015 S/P FNA  LLP 2.5 cm in 2021 with benign cytology    The patient was followed by Dr. Everardo All from 2021 until 12/2021  SUBJECTIVE:    Today (01/21/2024):  Sharon Fowler is here for Pt returns for f/u of MNG .  Weight continues to fluctuate Denies local neck swelling or dysphagia and no pain Denies palpitations  Denies tremors  She takes takes stool softners twice daily    HISTORY:  Past Medical History:  Past Medical History:  Diagnosis Date   ANEMIA, IRON DEFICIENCY UNSPEC 06/11/2009   corrected with iron replacement---menstrual reelated   Bilateral swelling of feet    Constipation    GOITER, MULTINODULAR 07/15/2009   HYPERLIPIDEMIA 03/25/2008   HYPERTENSION 03/25/2008   OBESITY 03/25/2008   Prediabetes    SOB (shortness of breath)    Swallowing difficulty    Past Surgical History:  Past Surgical History:  Procedure Laterality Date   CESAREAN SECTION  7/91, 5/95   x2   CHOLECYSTECTOMY  07/30/1990   laparascopy   ESOPHAGOGASTRODUODENOSCOPY (EGD) WITH PROPOFOL N/A 08/16/2017   Procedure: ESOPHAGOGASTRODUODENOSCOPY (EGD) WITH PROPOFOL;  Surgeon: Ovidio Kin, MD;  Location: [facility];  Service: General;  Laterality: N/A;   LAPAROSCOPIC GASTRIC BANDING  10/30/2009   LEEP  11/2009   TUBAL LIGATION     01/2003   Social History:  reports that she quit smoking about 33 years ago. Her smoking use included cigarettes. She has never used smokeless  tobacco. She reports current alcohol use. She reports that she does not use drugs. Family History:  Family History  Problem Relation Age of Onset   Alcohol abuse Mother    Hyperlipidemia Mother    Hypertension Mother    Diabetes Mother    Stroke Mother    Drug abuse Mother    Obesity Mother    Drug abuse Father    Diabetes Father    Alcohol abuse Father    Stroke Father    Goiter Neg Hx      HOME MEDICATIONS: Allergies as of 01/22/2024       Reactions   Chlorthalidone Other (See Comments)   REACTION: hypokalemia (2.8)   Lisinopril    cough        Medication List        Accurate as of January 21, 2024  1:58 PM. If you have any questions, ask your nurse or doctor.          albuterol 108 (90 Base) MCG/ACT inhaler Commonly known as: VENTOLIN HFA Inhale 1-2 puffs into the lungs every 6 (six) hours as needed.   amLODipine 5 MG tablet Commonly known as: NORVASC Take 1 tablet (5 mg total) by mouth daily.   aspirin EC 81 MG tablet Take 81 mg by mouth daily. Swallow whole.   atorvastatin 10 MG tablet Commonly known as: LIPITOR Take 1 tablet (10 mg total) by mouth daily.   buPROPion 150 MG  12 hr tablet Commonly known as: Wellbutrin SR Take 1 tablet (150 mg total) by mouth daily.   carvedilol 12.5 MG tablet Commonly known as: COREG Take 1 tablet (12.5 mg total) by mouth 2 (two) times daily with a meal.   losartan 100 MG tablet Commonly known as: COZAAR Take 1 tablet (100 mg total) by mouth daily.   ondansetron 4 MG tablet Commonly known as: Zofran Take 1 tablet (4 mg total) by mouth every 8 (eight) hours as needed for nausea or vomiting.   SENNA S PO Take by mouth.   trospium 20 MG tablet Commonly known as: SANCTURA Take 1 tablet (20 mg total) by mouth 2 (two) times daily.   Vitamin D (Ergocalciferol) 1.25 MG (50000 UNIT) Caps capsule Commonly known as: DRISDOL Take 1 capsule (50,000 Units total) by mouth every 7 (seven) days.   ZYRTEC PO Take by  mouth.          OBJECTIVE:   PHYSICAL EXAM: VS: LMP 05/08/2011 (Approximate)    EXAM: General: Pt appears well and is in NAD  Eyes: External eye exam normal without stare, lid lag or exophthalmos.  EOM intact.    Neck: General: Supple without adenopathy. Thyroid: Thyroid size normal.  Left thyroid asymmetry noted  Lungs: Clear with good BS bilat   Heart: Auscultation: RRR.  Extremities:  BL LE: No pretibial edema   Mental Status: Judgment, insight: Intact Orientation: Oriented to time, place, and person Mood and affect: No depression, anxiety, or agitation     DATA REVIEWED:  Latest Reference Range & Units 09/24/23 09:05  TSH 0.35 - 5.50 uIU/mL 1.39  T4,Free(Direct) 0.60 - 1.60 ng/dL 6.38    Thyroid Ultrasound 04/27/2023  Estimated total number of nodules >/= 1 cm: 6-10   Number of spongiform nodules >/=  2 cm not described below (TR1): 0   Number of mixed cystic and solid nodules >/= 1.5 cm not described below (TR2): 0   _________________________________________________________   Nodule labeled 1 in the right isthmus measures below the threshold for future surveillance, as it has decreased below 1 cm and remains TR 4. Discontinuing surveillance may be reasonable.   Nodule 2, superior right thyroid, unchanged in size with spongiform characteristics. Nodule does not meet criteria for surveillance.   Nodule labeled 3 superior right thyroid, unchanged. Nodule has spongiform characteristics and does not meet criteria for surveillance.   Nodule labeled 4, superior left thyroid, measures 1.4 cm and remains TR 3. Nodule is now below the threshold for further surveillance and discontinuing may be reasonable.   Nodule labeled 5, mid left thyroid, 1.0 cm. Nodule has TR 3 characteristics and does not meet criteria for surveillance.   Nodule labeled 6, mid left thyroid, 1.4 cm. Nodule has TR 3 characteristics and does not meet criteria for surveillance.   Nodule  labeled 7, inferior left thyroid, 2.4 cm, decreased from previous. Nodule has undergone prior biopsy. Assuming benign result no further specific follow-up would be indicated.   No adenopathy   IMPRESSION: Redemonstration of multinodular thyroid as above.   The previous 2 thyroid nodules of interest for surveillance (the rightward isthmic thyroid nodule, and the left superior thyroid nodule) have now decreased in size and are below threshold for further surveillance. Discontinuing surveillance may be reasonable.     FNA Left inferior nodule 07/07/2020   Clinical History: Left, Inferior 2.5 cm;  Other 2 dimensions: 1.8 x 1.3 cm, solid/almost completely solid hypoechoic , punctate echogenic foci TI-RADS - 7 Specimen  Submitted:  A. THYROID, LLP, FINE NEEDLE ASPIRATION:   FINAL MICROSCOPIC DIAGNOSIS: - Consistent with benign follicular nodule (Bethesda category I   Old records , labs and images have been reviewed.   ASSESSMENT / PLAN / RECOMMENDATIONS:   MNG  - Pt is clinically and biochemical euthyroid  - She is S/P left inferior benign FNA 2021 -Thyroid ultrasound 2024 showed decrease in size of some of the nodules and none of the nodules met surveillance criteria -We have opted to proceed with repeat ultrasound this year, if nodules remain stable as last year we will discontinue surveillance      Signed electronically by: Lyndle Herrlich, MD  [facility]  [facility] [address] [place] [place], [place] [address] Phone: [phone] FAX: [phone]      CC: Deeann Saint, MD [address] [place] [address] Phone: [phone]  Fax: [phone]   Return to Endocrinology clinic as below: Future Appointments  Date Time Provider Department Center  01/22/2024  7:30 AM Shaneil Yazdi, Konrad Dolores, MD [facility] None  02/04/2024  9:00 AM [facility]  05/01/2024  8:00 AM Selmer Dominion, NP [facility]  [facility]

## 2024-01-22 ENCOUNTER — Ambulatory Visit: Payer: 59 | Admitting: Internal Medicine

## 2024-01-22 ENCOUNTER — Encounter: Payer: Self-pay | Admitting: Internal Medicine

## 2024-01-22 VITALS — BP 136/82 | HR 88 | Ht 64.0 in | Wt 317.0 lb

## 2024-01-22 DIAGNOSIS — E042 Nontoxic multinodular goiter: Secondary | ICD-10-CM

## 2024-01-28 ENCOUNTER — Ambulatory Visit
Admission: RE | Admit: 2024-01-28 | Discharge: 2024-01-28 | Disposition: A | Discharge status: Home / Self Care | Source: Ambulatory Visit | Attending: Internal Medicine | Admitting: Internal Medicine

## 2024-01-28 DIAGNOSIS — E042 Nontoxic multinodular goiter: Secondary | ICD-10-CM

## 2024-01-31 ENCOUNTER — Encounter: Payer: Self-pay | Admitting: Internal Medicine

## 2024-02-04 ENCOUNTER — Ambulatory Visit (INDEPENDENT_AMBULATORY_CARE_PROVIDER_SITE_OTHER): Payer: 59

## 2024-02-04 DIAGNOSIS — Z23 Encounter for immunization: Secondary | ICD-10-CM | POA: Diagnosis not present

## 2024-03-05 ENCOUNTER — Encounter: Payer: Self-pay | Admitting: Obstetrics and Gynecology

## 2024-03-05 ENCOUNTER — Ambulatory Visit: Admitting: Obstetrics and Gynecology

## 2024-03-05 ENCOUNTER — Other Ambulatory Visit (HOSPITAL_COMMUNITY)
Admission: RE | Admit: 2024-03-05 | Discharge: 2024-03-05 | Disposition: A | Discharge status: Home / Self Care | Source: Ambulatory Visit | Attending: Obstetrics and Gynecology | Admitting: Obstetrics and Gynecology

## 2024-03-05 VITALS — BP 136/83 | HR 109

## 2024-03-05 DIAGNOSIS — N898 Other specified noninflammatory disorders of vagina: Secondary | ICD-10-CM | POA: Diagnosis not present

## 2024-03-05 DIAGNOSIS — N3281 Overactive bladder: Secondary | ICD-10-CM

## 2024-03-05 DIAGNOSIS — R350 Frequency of micturition: Secondary | ICD-10-CM

## 2024-03-05 LAB — POCT URINALYSIS DIPSTICK
Blood, UA: NEGATIVE
Glucose, UA: NEGATIVE
Ketones, UA: NEGATIVE
Leukocytes, UA: NEGATIVE
Nitrite, UA: NEGATIVE
Protein, UA: POSITIVE — AB
Spec Grav, UA: >=1.03 — AB (ref 1.010–1.025)
Urobilinogen, UA: 0.2 U/dL
pH, UA: 5.5 (ref 5.0–8.0)

## 2024-03-05 NOTE — Progress Notes (Signed)
 [facility] Return Visit  SUBJECTIVE  History of Present Illness: Sharon Fowler is a 53 y.o. female seen in follow-up for OAB/UUI. Patient reports she is taking her Trospium  20mg  nightly for work but still experiences leakage during her resting time and feels self conscious about the urine odor.   She reports she is not drinking much during a 12 hour shift due to concerns about the odor. She also reports she is unsure if she is having a vaginal odor. Denies vaginal itching. Denies bleeding or pain.     Past Medical History: Patient  has a past medical history of ANEMIA, IRON DEFICIENCY UNSPEC (06/11/2009), Bilateral swelling of feet, Constipation, GOITER, MULTINODULAR (07/15/2009), HYPERLIPIDEMIA (03/25/2008), HYPERTENSION (03/25/2008), OBESITY (03/25/2008), Prediabetes, SOB (shortness of breath), and Swallowing difficulty.   Past Surgical History: She  has a past surgical history that includes Laparoscopic gastric banding (10/30/2009); Cholecystectomy (07/30/1990); Tubal ligation; Cesarean section (7/91, 5/95); Esophagogastroduodenoscopy (egd) with propofol  (N/A, 08/16/2017); and LEEP (11/2009).   Medications: She has a current medication list which includes the following prescription(s): albuterol , amlodipine , aspirin ec, atorvastatin , bupropion , carvedilol , cetirizine hcl, losartan , ondansetron , sennosides-docusate sodium, and trospium .   Allergies: Patient is allergic to chlorthalidone and lisinopril .   Social History: Patient  reports that she quit smoking about 33 years ago. Her smoking use included cigarettes. She has never used smokeless tobacco. She reports current alcohol use. She reports that she does not use drugs.     OBJECTIVE    Lab Results  Component Value Date   COLORU Amber 03/05/2024   CLARITYU Clear 03/05/2024   GLUCOSEUR Negative 03/05/2024   BILIRUBINUR small 03/05/2024   KETONESU Negative 03/05/2024   SPECGRAV >=1.030 (A) 03/05/2024   RBCUR  Negative 03/05/2024   PHUR 5.5 03/05/2024   PROTEINUR Positive (A) 03/05/2024   UROBILINOGEN 0.2 03/05/2024   LEUKOCYTESUR Negative 03/05/2024     Physical Exam: Vitals:   03/05/24 0813  BP: 136/83  Pulse: (!) 109   Gen: No apparent distress, A&O x 3.  Detailed Urogynecologic Evaluation:  Vaginal tissues normal in appearance. No sign of bleeding, mass, or specific colored discharge. Aptima swab obtained.   ASSESSMENT AND PLAN    [name] is a 53 y.o. with:  1. Vaginal irritation   2. Overactive bladder   3. Urinary frequency    Patient was concerned about UTI, but urine results today only show an increase in protein or bilirubin. We discussed this can be from a large meal or dehydration components.  Patient is considering her tertiary options for her OAB. She has previously failed Myrbetriq, Oxtbutynin, and Vesicare . [name] is not affordable and neither was Trospium  60mg  ER daily. She is currently on Trospium  20mg  daily and considering options of SNM vs. Botox. Aptima swab obtained to rule out yeast and BV.  Will plan to inform patient of swab results. If she were to desire tertiary intervention,she would need to follow up with Dr. Frutoso Jing for planning of this.    [name] [name], NP

## 2024-03-06 LAB — CERVICOVAGINAL ANCILLARY ONLY
Bacterial Vaginitis (gardnerella): NEGATIVE
Candida Glabrata: NEGATIVE
Candida Vaginitis: NEGATIVE
Comment: NEGATIVE
Comment: NEGATIVE
Comment: NEGATIVE
Comment: NEGATIVE
Trichomonas: NEGATIVE

## 2024-03-17 ENCOUNTER — Other Ambulatory Visit (HOSPITAL_COMMUNITY): Payer: Self-pay

## 2024-05-01 ENCOUNTER — Ambulatory Visit: Payer: 59 | Admitting: Obstetrics and Gynecology

## 2024-06-28 ENCOUNTER — Other Ambulatory Visit: Payer: Self-pay | Admitting: Family Medicine

## 2024-06-28 DIAGNOSIS — I10 Essential (primary) hypertension: Secondary | ICD-10-CM

## 2024-06-29 ENCOUNTER — Other Ambulatory Visit: Payer: Self-pay

## 2024-07-01 ENCOUNTER — Other Ambulatory Visit (HOSPITAL_COMMUNITY): Payer: Self-pay

## 2024-07-01 MED ORDER — CARVEDILOL 12.5 MG PO TABS
12.5000 mg | ORAL_TABLET | Freq: Two times a day (BID) | ORAL | 0 refills | Status: DC
  Filled 2024-07-01: qty 90, 45d supply, fill #0

## 2024-07-31 ENCOUNTER — Encounter: Payer: Self-pay | Admitting: Obstetrics and Gynecology

## 2024-07-31 ENCOUNTER — Encounter (HOSPITAL_COMMUNITY): Payer: Self-pay

## 2024-07-31 ENCOUNTER — Other Ambulatory Visit (HOSPITAL_COMMUNITY): Payer: Self-pay

## 2024-07-31 ENCOUNTER — Ambulatory Visit: Admitting: Obstetrics and Gynecology

## 2024-07-31 VITALS — BP 134/97 | HR 99

## 2024-07-31 DIAGNOSIS — R350 Frequency of micturition: Secondary | ICD-10-CM

## 2024-07-31 DIAGNOSIS — N3281 Overactive bladder: Secondary | ICD-10-CM

## 2024-07-31 DIAGNOSIS — N393 Stress incontinence (female) (male): Secondary | ICD-10-CM

## 2024-07-31 MED ORDER — VIBEGRON 75 MG PO TABS
75.0000 mg | ORAL_TABLET | Freq: Every day | ORAL | 5 refills | Status: AC
  Filled 2024-07-31 – 2024-09-10 (×3): qty 30, 30d supply, fill #0
  Filled 2024-10-09: qty 30, 30d supply, fill #1
  Filled 2024-11-07 – 2024-11-18 (×4): qty 30, 30d supply, fill #2

## 2024-07-31 NOTE — Progress Notes (Signed)
 [facility] Return Visit  SUBJECTIVE  History of Present Illness: Sharon Fowler is a 53 y.o. female seen in follow-up for OAB. Plan at last visit was continue Trospium  20mg . She has been doing Trospium  20mg  daily and then twice daily every other day and has been doing okay with this regimen. She reports being 25% better from baseline and states so far this is working the best for her.     She reports she is still struggling at work with feeling that she has an odor because after she urinates she will be working and then have leakage after.   She is not interested in botox and she does not feel comfortable doing SNM at this time.    Past Medical History: Patient  has a past medical history of ANEMIA, IRON DEFICIENCY UNSPEC (06/11/2009), Bilateral swelling of feet, Constipation, GOITER, MULTINODULAR (07/15/2009), HYPERLIPIDEMIA (03/25/2008), HYPERTENSION (03/25/2008), OBESITY (03/25/2008), Prediabetes, SOB (shortness of breath), and Swallowing difficulty.   Past Surgical History: She  has a past surgical history that includes Laparoscopic gastric banding (10/30/2009); Cholecystectomy (07/30/1990); Tubal ligation; Cesarean section (7/91, 5/95); Esophagogastroduodenoscopy (egd) with propofol  (N/A, 08/16/2017); and LEEP (11/2009).   Medications: She has a current medication list which includes the following prescription(s): amlodipine , aspirin ec, atorvastatin , carvedilol , cetirizine hcl, losartan , sennosides-docusate sodium, and trospium .   Allergies: Patient is allergic to chlorthalidone and lisinopril .   Social History: Patient  reports that she quit smoking about 33 years ago. Her smoking use included cigarettes. She has never used smokeless tobacco. She reports current alcohol use. She reports that she does not use drugs.     OBJECTIVE     Physical Exam: Vitals:   07/31/24 0807  BP: (!) 134/97  Pulse: 99   Gen: No apparent distress, A&O x 3.  Detailed  Urogynecologic Evaluation:  Deferred.    ASSESSMENT AND PLAN    Ms. Rizzolo is a 53 y.o. with:  1. Overactive bladder   2. Urinary frequency   3. SUI (stress urinary incontinence, female)    The only medication that patient has not yet tried that would be stronger at this time would be [place] 75mg  daily.  Patient will start with samples. 2 weeks of samples given and we can start a PA.  Patient does report if she does not squeeze her muscles, then she has leakage. We could consider urethral bulking but will start with focusing on the OAB first treating with Gemtesa 75mg  daily.  Patient to let me know via message if she is improving.   Harlin Mazzoni G Shaketa Serafin, NP

## 2024-08-05 ENCOUNTER — Other Ambulatory Visit (HOSPITAL_COMMUNITY): Payer: Self-pay

## 2024-08-06 ENCOUNTER — Encounter: Payer: Self-pay | Admitting: Obstetrics and Gynecology

## 2024-08-08 ENCOUNTER — Other Ambulatory Visit: Payer: Self-pay | Admitting: Obstetrics and Gynecology

## 2024-08-08 DIAGNOSIS — Z1231 Encounter for screening mammogram for malignant neoplasm of breast: Secondary | ICD-10-CM

## 2024-08-11 ENCOUNTER — Other Ambulatory Visit: Payer: Self-pay | Admitting: Family Medicine

## 2024-08-11 ENCOUNTER — Other Ambulatory Visit: Payer: Self-pay

## 2024-08-11 ENCOUNTER — Other Ambulatory Visit (HOSPITAL_COMMUNITY): Payer: Self-pay

## 2024-08-11 DIAGNOSIS — I10 Essential (primary) hypertension: Secondary | ICD-10-CM

## 2024-08-12 ENCOUNTER — Other Ambulatory Visit (HOSPITAL_COMMUNITY): Payer: Self-pay

## 2024-08-13 ENCOUNTER — Other Ambulatory Visit: Payer: Self-pay

## 2024-08-13 ENCOUNTER — Other Ambulatory Visit (HOSPITAL_COMMUNITY): Payer: Self-pay

## 2024-08-13 ENCOUNTER — Encounter: Payer: Self-pay | Admitting: Pharmacist

## 2024-08-13 MED ORDER — CARVEDILOL 12.5 MG PO TABS
12.5000 mg | ORAL_TABLET | Freq: Two times a day (BID) | ORAL | 0 refills | Status: DC
  Filled 2024-08-13: qty 90, 45d supply, fill #0

## 2024-08-18 ENCOUNTER — Other Ambulatory Visit: Payer: Self-pay | Admitting: Medical Genetics

## 2024-08-18 DIAGNOSIS — Z006 Encounter for examination for normal comparison and control in clinical research program: Secondary | ICD-10-CM

## 2024-09-10 ENCOUNTER — Other Ambulatory Visit: Payer: Self-pay

## 2024-09-24 ENCOUNTER — Ambulatory Visit: Admitting: Family Medicine

## 2024-09-24 ENCOUNTER — Encounter: Payer: Self-pay | Admitting: Family Medicine

## 2024-09-24 ENCOUNTER — Other Ambulatory Visit: Payer: Self-pay

## 2024-09-24 ENCOUNTER — Other Ambulatory Visit (HOSPITAL_COMMUNITY): Payer: Self-pay

## 2024-09-24 VITALS — BP 146/96 | HR 84 | Temp 98.3°F | Wt 317.4 lb

## 2024-09-24 DIAGNOSIS — R7303 Prediabetes: Secondary | ICD-10-CM

## 2024-09-24 DIAGNOSIS — E785 Hyperlipidemia, unspecified: Secondary | ICD-10-CM

## 2024-09-24 DIAGNOSIS — I10 Essential (primary) hypertension: Secondary | ICD-10-CM | POA: Diagnosis not present

## 2024-09-24 DIAGNOSIS — E559 Vitamin D deficiency, unspecified: Secondary | ICD-10-CM | POA: Diagnosis not present

## 2024-09-24 DIAGNOSIS — Z0000 Encounter for general adult medical examination without abnormal findings: Secondary | ICD-10-CM

## 2024-09-24 DIAGNOSIS — E669 Obesity, unspecified: Secondary | ICD-10-CM

## 2024-09-24 LAB — CBC WITH DIFFERENTIAL/PLATELET
Basophils Absolute: 0.1 K/uL (ref 0.0–0.1)
Basophils Relative: 0.8 % (ref 0.0–3.0)
Eosinophils Absolute: 0.2 K/uL (ref 0.0–0.7)
Eosinophils Relative: 2.6 % (ref 0.0–5.0)
HCT: 39.2 % (ref 36.0–46.0)
Hemoglobin: 13.1 g/dL (ref 12.0–15.0)
Lymphocytes Relative: 30.2 % (ref 12.0–46.0)
Lymphs Abs: 2.7 K/uL (ref 0.7–4.0)
MCHC: 33.4 g/dL (ref 30.0–36.0)
MCV: 81.8 fl (ref 78.0–100.0)
Monocytes Absolute: 0.5 K/uL (ref 0.1–1.0)
Monocytes Relative: 6.1 % (ref 3.0–12.0)
Neutro Abs: 5.3 K/uL (ref 1.4–7.7)
Neutrophils Relative %: 60.3 % (ref 43.0–77.0)
Platelets: 344 K/uL (ref 150.0–400.0)
RBC: 4.78 Mil/uL (ref 3.87–5.11)
RDW: 16 % — ABNORMAL HIGH (ref 11.5–15.5)
WBC: 8.8 K/uL (ref 4.0–10.5)

## 2024-09-24 LAB — LIPID PANEL
Cholesterol: 206 mg/dL — ABNORMAL HIGH (ref 0–200)
HDL: 48.5 mg/dL (ref >39.00)
LDL Cholesterol: 140 mg/dL — ABNORMAL HIGH (ref 0–99)
NonHDL: 157.71
Total CHOL/HDL Ratio: 4
Triglycerides: 91 mg/dL (ref 0.0–149.0)
VLDL: 18.2 mg/dL (ref 0.0–40.0)

## 2024-09-24 LAB — COMPREHENSIVE METABOLIC PANEL WITH GFR
ALT: 16 U/L (ref 0–35)
AST: 17 U/L (ref 0–37)
Albumin: 4.2 g/dL (ref 3.5–5.2)
Alkaline Phosphatase: 131 U/L — ABNORMAL HIGH (ref 39–117)
BUN: 12 mg/dL (ref 6–23)
CO2: 28 meq/L (ref 19–32)
Calcium: 9.5 mg/dL (ref 8.4–10.5)
Chloride: 103 meq/L (ref 96–112)
Creatinine, Ser: 0.62 mg/dL (ref 0.40–1.20)
GFR: 102.72 mL/min (ref >60.00)
Glucose, Bld: 93 mg/dL (ref 70–99)
Potassium: 3.7 meq/L (ref 3.5–5.1)
Sodium: 140 meq/L (ref 135–145)
Total Bilirubin: 0.5 mg/dL (ref 0.2–1.2)
Total Protein: 7.7 g/dL (ref 6.0–8.3)

## 2024-09-24 LAB — VITAMIN D 25 HYDROXY (VIT D DEFICIENCY, FRACTURES): VITD: 18.27 ng/mL — ABNORMAL LOW (ref 30.00–100.00)

## 2024-09-24 LAB — T4, FREE: Free T4: 0.85 ng/dL (ref 0.60–1.60)

## 2024-09-24 LAB — TSH: TSH: 1.76 u[IU]/mL (ref 0.35–5.50)

## 2024-09-24 LAB — VITAMIN B12: Vitamin B-12: 300 pg/mL (ref 211–911)

## 2024-09-24 LAB — HEMOGLOBIN A1C: Hgb A1c MFr Bld: 5.9 % (ref 4.6–6.5)

## 2024-09-24 MED ORDER — AMLODIPINE BESYLATE 5 MG PO TABS
5.0000 mg | ORAL_TABLET | Freq: Every day | ORAL | 3 refills | Status: AC
  Filled 2024-09-24 – 2024-11-11 (×3): qty 90, 90d supply, fill #0

## 2024-09-24 MED ORDER — LOSARTAN POTASSIUM 100 MG PO TABS
100.0000 mg | ORAL_TABLET | Freq: Every day | ORAL | 3 refills | Status: AC
  Filled 2024-09-24 – 2024-10-09 (×2): qty 90, 90d supply, fill #0

## 2024-09-24 MED ORDER — ATORVASTATIN CALCIUM 10 MG PO TABS
10.0000 mg | ORAL_TABLET | Freq: Every day | ORAL | 3 refills | Status: AC
  Filled 2024-09-24 – 2024-10-09 (×2): qty 90, 90d supply, fill #0

## 2024-09-24 MED ORDER — CARVEDILOL 25 MG PO TABS
25.0000 mg | ORAL_TABLET | Freq: Two times a day (BID) | ORAL | 3 refills | Status: AC
  Filled 2024-09-24: qty 180, 90d supply, fill #0

## 2024-09-24 NOTE — Progress Notes (Signed)
 Established Patient Office Visit   Subjective  Patient ID: Sharon Fowler, female    DOB: 04-Sep-1971  Age: 53 y.o. MRN: [id number]  Chief Complaint  Patient presents with   Annual Exam    Pt is a 53 yo female seen for CPE.  Pt is fasting.  Pt states she is doing well overall and is without concern.  Taking bp meds consistently, though has yet to take them this am.  BP at home typically 140s/90s.  Patient denies headaches, CP, changes in vision, LE edema.  Currently taking Coreg  12.5 mg twice daily, losartan  100 mg daily, Norvasc  5 mg daily.  Requesting refills on medications.  Has Pap and mammogram scheduled next month.  Colon cancer screening up-to-date as Cologuard done 12/18/2023 and normal.  Patient would like to return for labs.    Patient Active Problem List   Diagnosis Date Noted   S/P laparoscopic surgery 09/25/2022   Arthritis of both knees 09/04/2022   Depression 07/18/2022   Pain in both knees 07/18/2022   Vitamin D  deficiency 05/30/2022   Prediabetes 05/30/2022   Gastric band erosion 08/20/2017   Proteinuria 05/26/2017   Non-intractable vomiting with nausea 05/26/2017   Acute left-sided thoracic back pain 05/26/2017   Flank pain 05/26/2017   GOITER, MULTINODULAR 07/15/2009   ANEMIA, IRON DEFICIENCY UNSPEC 06/11/2009   HLD (hyperlipidemia) 03/25/2008   OBESITY 03/25/2008   Essential hypertension 03/25/2008   Past Medical History:  Diagnosis Date   ANEMIA, IRON DEFICIENCY UNSPEC 06/11/2009   corrected with iron replacement---menstrual reelated   Bilateral swelling of feet    Constipation    GOITER, MULTINODULAR 07/15/2009   HYPERLIPIDEMIA 03/25/2008   HYPERTENSION 03/25/2008   OBESITY 03/25/2008   Prediabetes    SOB (shortness of breath)    Swallowing difficulty    Past Surgical History:  Procedure Laterality Date   CESAREAN SECTION  7/91, 5/95   x2   CHOLECYSTECTOMY  07/30/1990   laparascopy   ESOPHAGOGASTRODUODENOSCOPY (EGD) WITH PROPOFOL  N/A  08/16/2017   Procedure: ESOPHAGOGASTRODUODENOSCOPY (EGD) WITH PROPOFOL ;  Surgeon: Ethyl Lenis, MD;  Location: [facility];  Service: General;  Laterality: N/A;   LAPAROSCOPIC GASTRIC BANDING  10/30/2009   LEEP  11/2009   TUBAL LIGATION     01/2003   Social History   Tobacco Use   Smoking status: Former    Current packs/day: 0.00    Types: Cigarettes    Quit date: 01/02/1991    Years since quitting: 33.7   Smokeless tobacco: Never  Vaping Use   Vaping status: Never Used  Substance Use Topics   Alcohol use: Yes    Alcohol/week: 0.0 standard drinks of alcohol    Comment: occ   Drug use: No   Family History  Problem Relation Age of Onset   Alcohol abuse Mother    Hyperlipidemia Mother    Hypertension Mother    Diabetes Mother    Stroke Mother    Drug abuse Mother    Obesity Mother    Drug abuse Father    Diabetes Father    Alcohol abuse Father    Stroke Father    Prostate cancer Father    Goiter Neg Hx    Uterine cancer Neg Hx    Renal cancer Neg Hx    Bladder Cancer Neg Hx    Allergies  Allergen Reactions   Chlorthalidone Other (See Comments)    Hydrochlorothiazide - REACTION: hypokalemia (2.8)   Lisinopril      cough  ROS Negative unless stated above    Objective:     BP (!) 156/102 (BP Location: Right Wrist, Patient Position: Sitting, Cuff Size: Normal)   Pulse 84   Temp 98.3 F (36.8 C) (Oral)   Wt (!) 317 lb 6.4 oz (144 kg)   LMP 05/08/2011 (Approximate)   SpO2 97%   BMI 54.48 kg/m  BP Readings from Last 3 Encounters:  09/24/24 (!) 156/102  07/31/24 (!) 134/97  03/05/24 136/83   Wt Readings from Last 3 Encounters:  09/24/24 (!) 317 lb 6.4 oz (144 kg)  01/22/24 (!) 317 lb (143.8 kg)  09/24/23 (!) 322 lb 8 oz (146.3 kg)      Physical Exam Constitutional:      Appearance: Normal appearance. She is obese.  HENT:     Head: Normocephalic and atraumatic.     Right Ear: Tympanic membrane, ear canal and external ear normal.     Left Ear:  Tympanic membrane, ear canal and external ear normal.     Nose: Nose normal.     Mouth/Throat:     Mouth: Mucous membranes are moist.     Pharynx: No oropharyngeal exudate or posterior oropharyngeal erythema.  Eyes:     General: No scleral icterus.    Extraocular Movements: Extraocular movements intact.     Conjunctiva/sclera: Conjunctivae normal.     Pupils: Pupils are equal, round, and reactive to light.  Neck:     Thyroid : No thyromegaly.     Vascular: No carotid bruit.     Comments: Mild goiter Cardiovascular:     Rate and Rhythm: Normal rate and regular rhythm.     Pulses: Normal pulses.     Heart sounds: Normal heart sounds. No murmur heard.    No friction rub.  Pulmonary:     Effort: Pulmonary effort is normal.     Breath sounds: Normal breath sounds. No wheezing, rhonchi or rales.  Abdominal:     General: Bowel sounds are normal.     Palpations: Abdomen is soft.     Tenderness: There is no abdominal tenderness.  Musculoskeletal:        General: No deformity. Normal range of motion.  Lymphadenopathy:     Cervical: No cervical adenopathy.  Skin:    General: Skin is warm and dry.     Findings: No lesion.     Comments: Faint acanthosis nigricans of b/l neck.  Neurological:     General: No focal deficit present.     Mental Status: She is alert and oriented to person, place, and time.  Psychiatric:        Mood and Affect: Mood normal.        Thought Content: Thought content normal.        09/24/2024    8:59 AM 12/26/2023    4:21 PM 09/24/2023    8:08 AM  Depression screen PHQ 2/9  Decreased Interest 0 0 0  Down, Depressed, Hopeless 0 0 0  PHQ - 2 Score 0 0 0  Altered sleeping 0    Tired, decreased energy 0    Change in appetite 0    Feeling bad or failure about yourself  0    Trouble concentrating 0    Moving slowly or fidgety/restless 0    Suicidal thoughts 0    PHQ-9 Score 0        09/24/2024    8:59 AM 12/26/2023    4:21 PM 05/14/2020    9:21 AM  04/15/2020  9:28 AM  GAD 7 : Generalized Anxiety Score  Nervous, Anxious, on Edge 0 0 2 0  Control/stop worrying 0 0 0 0  Worry too much - different things 0 0 0 0  Trouble relaxing 0 0 0 0  Restless 0 0 0 0  Easily annoyed or irritable 0 0 0 0  Afraid - awful might happen 0 0 0 0  Total GAD 7 Score 0 0 2 0  Anxiety Difficulty   Not difficult at all Not difficult at all     No results found for any visits on 09/24/24.    Assessment & Plan:   Well adult exam -     CBC with Differential/Platelet; Future -     Comprehensive metabolic panel with GFR; Future -     Hemoglobin A1c; Future -     Lipid panel; Future -     T4, free; Future -     TSH; Future  Essential hypertension -     Comprehensive metabolic panel with GFR; Future -     T4, free; Future -     TSH; Future -     Carvedilol ; Take 1 tablet (25 mg total) by mouth 2 (two) times daily with a meal.  Dispense: 180 tablet; Refill: 3 -     amLODIPine  Besylate; Take 1 tablet (5 mg total) by mouth daily.  Dispense: 90 tablet; Refill: 3 -     Losartan  Potassium; Take 1 tablet (100 mg total) by mouth daily.  Dispense: 90 tablet; Refill: 3  Vitamin D  deficiency -     VITAMIN D  25 Hydroxy (Vit-D Deficiency, Fractures); Future  Prediabetes -     Hemoglobin A1c; Future  Obesity, unspecified class, unspecified obesity type, unspecified whether serious comorbidity present -     Hemoglobin A1c; Future -     Lipid panel; Future -     T4, free; Future -     TSH; Future -     Vitamin B12; Future -     VITAMIN D  25 Hydroxy (Vit-D Deficiency, Fractures); Future  Hyperlipidemia, unspecified hyperlipidemia type -     Lipid panel; Future -     Atorvastatin  Calcium ; Take 1 tablet (10 mg total) by mouth daily.  Dispense: 90 tablet; Refill: 3   Age appropriate health screenings discussed.  Obtain labs.  Patient wishes to return in the future for labs.  Immunizations reviewed.  Influenza vaccine done 08/11/2024.  Consider shingles and  pneumonia vaccines.  Colon cancer screening normal on 12/18/2023 with Cologuard.  Repeat in 3 years.  Mammogram and Pap scheduled for next month.  Discussed the importance of lifestyle modifications for HLD, pre-DM, HTN, Body mass index is 54.48 kg/m.  As BP uncontrolled discussed increasing Coreg  from 12.5 mg twice daily to 25 mg twice daily.  Continue losartan  100 mg daily and Norvasc  5 mg daily.  Continue to monitor sodium intake.  Return in about 2 months (around 11/24/2024) for blood pressure.   Clotilda JONELLE Single, MD

## 2024-09-24 NOTE — Patient Instructions (Addendum)
 The increased dose of coreg  25 mg twice a day was sent to pharmacy along with your other meds.

## 2024-10-09 ENCOUNTER — Other Ambulatory Visit: Payer: Self-pay

## 2024-10-15 ENCOUNTER — Ambulatory Visit: Payer: Self-pay | Admitting: Family Medicine

## 2024-10-15 ENCOUNTER — Other Ambulatory Visit (HOSPITAL_COMMUNITY): Payer: Self-pay

## 2024-10-15 DIAGNOSIS — E559 Vitamin D deficiency, unspecified: Secondary | ICD-10-CM

## 2024-10-15 MED ORDER — VITAMIN D (ERGOCALCIFEROL) 1.25 MG (50000 UNIT) PO CAPS
50000.0000 [IU] | ORAL_CAPSULE | ORAL | 0 refills | Status: AC
  Filled 2024-10-15: qty 12, 84d supply, fill #0

## 2024-10-20 ENCOUNTER — Ambulatory Visit

## 2024-10-21 DIAGNOSIS — Z01419 Encounter for gynecological examination (general) (routine) without abnormal findings: Secondary | ICD-10-CM | POA: Diagnosis not present

## 2024-10-21 DIAGNOSIS — Z1331 Encounter for screening for depression: Secondary | ICD-10-CM | POA: Diagnosis not present

## 2024-10-21 DIAGNOSIS — Z1211 Encounter for screening for malignant neoplasm of colon: Secondary | ICD-10-CM | POA: Diagnosis not present

## 2024-10-21 DIAGNOSIS — N950 Postmenopausal bleeding: Secondary | ICD-10-CM | POA: Diagnosis not present

## 2024-10-21 DIAGNOSIS — Z1330 Encounter for screening examination for mental health and behavioral disorders, unspecified: Secondary | ICD-10-CM | POA: Diagnosis not present

## 2024-11-07 ENCOUNTER — Other Ambulatory Visit: Payer: Self-pay

## 2024-11-10 ENCOUNTER — Encounter: Payer: Self-pay | Admitting: *Deleted

## 2024-11-11 ENCOUNTER — Other Ambulatory Visit (HOSPITAL_COMMUNITY): Payer: Self-pay

## 2024-11-11 ENCOUNTER — Telehealth (HOSPITAL_COMMUNITY): Payer: Self-pay | Admitting: Pharmacy Technician

## 2024-11-13 ENCOUNTER — Other Ambulatory Visit (HOSPITAL_COMMUNITY): Payer: Self-pay

## 2024-11-13 ENCOUNTER — Ambulatory Visit
Admission: RE | Admit: 2024-11-13 | Discharge: 2024-11-13 | Disposition: A | Source: Ambulatory Visit | Attending: Obstetrics and Gynecology | Admitting: Obstetrics and Gynecology

## 2024-11-13 DIAGNOSIS — Z1231 Encounter for screening mammogram for malignant neoplasm of breast: Secondary | ICD-10-CM

## 2024-11-17 ENCOUNTER — Other Ambulatory Visit (HOSPITAL_COMMUNITY): Payer: Self-pay

## 2024-11-17 ENCOUNTER — Other Ambulatory Visit: Payer: Self-pay

## 2024-11-18 ENCOUNTER — Other Ambulatory Visit: Payer: Self-pay

## 2024-11-18 ENCOUNTER — Other Ambulatory Visit (HOSPITAL_COMMUNITY): Payer: Self-pay

## 2024-11-23 ENCOUNTER — Other Ambulatory Visit (HOSPITAL_COMMUNITY): Payer: Self-pay
